# Patient Record
Sex: Male | Born: 1945 | Race: White | Hispanic: No | Marital: Married
Health system: Midwestern US, Community
[De-identification: ages and names within clinical notes are randomized; demographics above are authoritative.]

## PROBLEM LIST (undated history)

## (undated) ENCOUNTER — Emergency Department (HOSPITAL_COMMUNITY): Admission: EM | Payer: Medicare Other | Source: Home / Self Care

## (undated) DIAGNOSIS — N400 Enlarged prostate without lower urinary tract symptoms: Secondary | ICD-10-CM

## (undated) DIAGNOSIS — N529 Male erectile dysfunction, unspecified: Secondary | ICD-10-CM

## (undated) DIAGNOSIS — J449 Chronic obstructive pulmonary disease, unspecified: Secondary | ICD-10-CM

## (undated) DIAGNOSIS — C679 Malignant neoplasm of bladder, unspecified: Secondary | ICD-10-CM

## (undated) DIAGNOSIS — E785 Hyperlipidemia, unspecified: Secondary | ICD-10-CM

## (undated) DIAGNOSIS — C61 Malignant neoplasm of prostate: Secondary | ICD-10-CM

## (undated) DIAGNOSIS — C772 Secondary and unspecified malignant neoplasm of intra-abdominal lymph nodes: Secondary | ICD-10-CM

## (undated) DIAGNOSIS — N3281 Overactive bladder: Secondary | ICD-10-CM

## (undated) DIAGNOSIS — R232 Flushing: Principal | ICD-10-CM

## (undated) DIAGNOSIS — N401 Enlarged prostate with lower urinary tract symptoms: Secondary | ICD-10-CM

## (undated) DIAGNOSIS — R351 Nocturia: Secondary | ICD-10-CM

## (undated) DIAGNOSIS — M818 Other osteoporosis without current pathological fracture: Secondary | ICD-10-CM

## (undated) DIAGNOSIS — R3129 Other microscopic hematuria: Secondary | ICD-10-CM

## (undated) DIAGNOSIS — T387X5A Adverse effect of androgens and anabolic congeners, initial encounter: Secondary | ICD-10-CM

## (undated) HISTORY — DX: Benign prostatic hyperplasia without lower urinary tract symptoms: N40.0

## (undated) HISTORY — DX: Hyperlipidemia, unspecified: E78.5

## (undated) HISTORY — DX: Malignant neoplasm of bladder, unspecified: C67.9

## (undated) HISTORY — DX: Chronic obstructive pulmonary disease, unspecified: J44.9

## (undated) HISTORY — DX: Male erectile dysfunction, unspecified: N52.9

## (undated) HISTORY — PX: UMBILICAL HERNIA REPAIR: SHX196

---

## 2007-08-07 ENCOUNTER — Ambulatory Visit: Payer: Self-pay | Admitting: Internal Medicine

## 2007-08-07 DIAGNOSIS — N529 Male erectile dysfunction, unspecified: Secondary | ICD-10-CM | POA: Insufficient documentation

## 2007-08-08 LAB — CONVERTED CEMR LAB
Cholesterol: 201 mg/dL (ref 0–200)
Total CHOL/HDL Ratio: 4.6
Triglycerides: 138 mg/dL (ref 0–149)

## 2007-11-06 ENCOUNTER — Ambulatory Visit: Payer: Self-pay | Admitting: Family Medicine

## 2007-11-06 DIAGNOSIS — J029 Acute pharyngitis, unspecified: Secondary | ICD-10-CM | POA: Insufficient documentation

## 2007-11-08 ENCOUNTER — Telehealth: Payer: Self-pay | Admitting: Family Medicine

## 2007-11-09 ENCOUNTER — Telehealth (INDEPENDENT_AMBULATORY_CARE_PROVIDER_SITE_OTHER): Payer: Self-pay | Admitting: *Deleted

## 2007-12-15 ENCOUNTER — Ambulatory Visit: Payer: Self-pay | Admitting: Internal Medicine

## 2007-12-15 DIAGNOSIS — J019 Acute sinusitis, unspecified: Secondary | ICD-10-CM | POA: Insufficient documentation

## 2008-02-07 ENCOUNTER — Ambulatory Visit: Payer: Self-pay | Admitting: Internal Medicine

## 2008-02-07 DIAGNOSIS — L723 Sebaceous cyst: Secondary | ICD-10-CM

## 2008-03-06 ENCOUNTER — Telehealth: Payer: Self-pay | Admitting: Internal Medicine

## 2009-11-06 ENCOUNTER — Ambulatory Visit: Payer: Self-pay | Admitting: Internal Medicine

## 2009-11-06 DIAGNOSIS — R002 Palpitations: Secondary | ICD-10-CM

## 2009-11-07 LAB — CONVERTED CEMR LAB
Albumin: 3.8 g/dL (ref 3.5–5.2)
Alkaline Phosphatase: 59 units/L (ref 39–117)
Basophils Relative: 0 % (ref 0.0–3.0)
Calcium: 9.3 mg/dL (ref 8.4–10.5)
Chloride: 107 meq/L (ref 96–112)
Eosinophils Absolute: 0.2 10*3/uL (ref 0.0–0.7)
Hemoglobin: 12.8 g/dL — ABNORMAL LOW (ref 13.0–17.0)
MCHC: 33 g/dL (ref 30.0–36.0)
MCV: 91.5 fL (ref 78.0–100.0)
Monocytes Absolute: 0.4 10*3/uL (ref 0.1–1.0)
Neutro Abs: 5.2 10*3/uL (ref 1.4–7.7)
Potassium: 4.5 meq/L (ref 3.5–5.1)
RBC: 4.25 M/uL (ref 4.22–5.81)
TSH: 0.97 microintl units/mL (ref 0.35–5.50)
Total Protein: 7.4 g/dL (ref 6.0–8.3)

## 2009-11-21 ENCOUNTER — Ambulatory Visit: Payer: Self-pay | Admitting: Internal Medicine

## 2009-11-26 ENCOUNTER — Encounter: Payer: Self-pay | Admitting: Internal Medicine

## 2009-11-26 LAB — CONVERTED CEMR LAB: Fecal Occult Bld: NEGATIVE

## 2010-02-23 ENCOUNTER — Ambulatory Visit: Payer: Self-pay | Admitting: Urology

## 2010-08-12 ENCOUNTER — Ambulatory Visit: Payer: Self-pay | Admitting: Urology

## 2010-08-17 ENCOUNTER — Ambulatory Visit: Payer: Self-pay | Admitting: Urology

## 2010-10-20 NOTE — Assessment & Plan Note (Signed)
Summary: CPX/DLO   Vital Signs:  Patient profile:   65 year old male Weight:      206 pounds BMI:     28.84 Temp:     98.6 degrees F oral Pulse rate:   72 / minute Pulse rhythm:   regular BP sitting:   140 / 80  (left arm) Cuff size:   large  Vitals Entered By: Mervin Hack CMA Duncan Dull) (November 06, 2009 3:29 PM) CC: adult physical   History of Present Illness: Doing okay No new concerns  Wife notes recurrent sinus infections Has been to urgent care twice in past winter season wood heat---humidifier puts out 6 gallons per day    Allergies: 1)  ! * Pencillin  Past History:  Past medical, surgical, family and social histories (including risk factors) reviewed for relevance to current acute and chronic problems.  Past Medical History: Erectile dysfunction  Past Surgical History: Reviewed history from 08/07/2007 and no changes required. 2004 Umbilical hernia  Family History: Reviewed history from 08/07/2007 and no changes required. Dad died of prostate cancer @69  Mom  has kidney problems, HTN Only child No CAD or DM No colon cancer  Social History: Reviewed history from 08/07/2007 and no changes required. 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 Eyes:  Denies double vision and vision loss-1 eye. ENT:  Denies decreased hearing and ringing in ears; Teeth okay--some gum problems regular with dentist and periodontist. CV:  Complains of palpitations and shortness of breath with exertion; denies chest pain or discomfort, difficulty breathing at night, difficulty breathing while lying down, fainting, and lightheadness; occ seems faster when active DOE more noticeable. Resp:   Denies cough and shortness of breath. GI:  Denies abdominal pain, bloody stools, change in bowel habits, dark tarry stools, indigestion, nausea, and vomiting. GU:  Complains of erectile dysfunction; denies urinary frequency and urinary hesitancy; hasn't used the levitra of late--not a part of their marriage. MS:  Denies joint pain and joint swelling. Derm:  Denies lesion(s) and rash. Neuro:  Denies headaches, numbness, tingling, and weakness. Psych:  Denies anxiety and depression. Heme:  Denies abnormal bruising and enlarge lymph nodes. Allergy:  Denies seasonal allergies and sneezing.  Physical Exam  General:  alert and normal appearance.   Eyes:  pupils equal, pupils round, and pupils reactive to light.   Ears:  R ear normal and L ear normal.   Mouth:  no erythema and no lesions.   Neck:  supple, no masses, no thyromegaly, no carotid bruits, and no cervical lymphadenopathy.   Lungs:  normal respiratory effort and normal breath sounds.   Heart:  normal rate, regular rhythm, no murmur, and no gallop.   Abdomen:  soft and non-tender.   Rectal:  no hemorrhoids and no masses.   Prostate:  no gland enlargement and no nodules.   Msk:  no joint tenderness and no joint swelling.   Pulses:  1+ in feet Extremities:  no edema Neurologic:  alert & oriented X3, strength normal in all extremities, and gait normal.   Skin:  no rashes and no suspicious lesions.   Axillary Nodes:  No palpable lymphadenopathy Psych:  normally interactive, good eye contact, not anxious appearing, and not depressed appearing.  Impression & Recommendations:  Problem # 1:  PREVENTIVE HEALTH CARE (ICD-V70.0) Assessment Comment Only discussed fitness PSA due doesn't want colon --will do stool immunoassay  Problem # 2:  PALPITATIONS (ICD-785.1) Assessment: New  doesn't sound pathologic discussed weight loss and exercise   Orders: EKG w/ Interpretation (93000) Venipuncture (86578) TLB-Renal Function Panel  (80069-RENAL) TLB-CBC Platelet - w/Differential (85025-CBCD) TLB-Hepatic/Liver Function Pnl (80076-HEPATIC) TLB-TSH (Thyroid Stimulating Hormone) (84443-TSH)  Complete Medication List: 1)  Adult Aspirin Low Strength 81 Mg Tbdp (Aspirin) .... Take 1 tablet by mouth once a day  Other Orders: Flu Vaccine 1yrs + [phone] Admin 1st Vaccine (95284) Admin 1st Vaccine [facility]) [phone]) Pneumococcal Vaccine (10272) Admin of Any Addtl Vaccine (53664) Admin of Any Addtl Vaccine (State) (40347Q) TLB-PSA (Prostate Specific Antigen) (84153-PSA)  Patient Instructions: 1)  Please schedule a follow-up appointment in 1 year.  2)  Complete your hemoccult cards and return them soon.   Current Allergies (reviewed today): ! * PENCILLIN   Pneumovax Vaccine    Vaccine Type: Pneumovax    Site: right deltoid    Mfr: Merck    Dose: 0.5 ml    Route: IM    Given by: Mervin Hack CMA (AAMA)    Exp. Date: 01/08/2011    Lot #: [id number]    VIS given: 04/17/96 version given November 06, 2009.  Influenza Vaccine    Vaccine Type: Fluvax 3+    Site: left deltoid    Mfr: GlaxoSmithKline    Dose: 0.5 ml    Route: IM    Given by: Mervin Hack CMA (AAMA)    Exp. Date: 03/19/2010    Lot #: [id number]    VIS given: 04/13/07 version given November 06, 2009.  Flu Vaccine Consent Questions    Do you have a history of severe allergic reactions to this vaccine? no    Any prior history of allergic reactions to egg and/or gelatin? no    Do you have a sensitivity to the preservative Thimersol? no    Do you have a past history of Guillan-Barre Syndrome? no    Do you currently have an acute febrile illness? no    Have you ever had a severe reaction to latex? no    Vaccine information given and explained to patient? yes    EKG  Procedure date:  11/06/2009  Findings:      sinus @79  Unremarkable

## 2010-10-20 NOTE — Letter (Signed)
Summary: Results Follow up Letter  [place] at [facility]  [address] [place], [place] [address]   Phone: [phone]  Fax: [phone]    11/26/2009 MRN: [id number]  [facility] [address] [place], [place]  [address]  Dear Mr. Rosko,  The following are the results of your recent test(s):  Test         Result    Pap Smear:        Normal _____  Not Normal _____ Comments: ______________________________________________________ Cholesterol: LDL(Bad cholesterol):         Your goal is less than:         HDL (Good cholesterol):       Your goal is more than: Comments:  ______________________________________________________ Mammogram:        Normal _____  Not Normal _____ Comments:  ___________________________________________________________________ Hemoccult:        Normal __X___  Not normal _______ Comments:  stool test is negative for blood ,we will plan to recheck this again in a year  _____________________________________________________________________ Other Tests:    We routinely do not discuss normal results over the telephone.  If you desire a copy of the results, or you have any questions about this information we can discuss them at your next office visit.   Sincerely,      Tillman Abide, MD

## 2010-10-20 NOTE — Letter (Signed)
Summary: [facility]: Immunoassay Fecal Occult Blood (iFOB) Order Form  [place] at [facility]  [address] [place], [place] [address]   Phone: [phone]  Fax: [phone]      [facility]: Immunoassay Fecal Occult Blood (iFOB) Order Form   November 06, 2009 MRN: [id number]   Cody Barry January 02, 1946   Physicican Name:_______Letvak__________________  Diagnosis Code:________V76.51__________________      [name] MD

## 2010-10-30 ENCOUNTER — Encounter: Payer: Self-pay | Admitting: Internal Medicine

## 2010-10-30 ENCOUNTER — Ambulatory Visit: Payer: Self-pay | Admitting: Internal Medicine

## 2010-10-30 ENCOUNTER — Other Ambulatory Visit: Payer: Self-pay | Admitting: Internal Medicine

## 2010-10-30 ENCOUNTER — Encounter (INDEPENDENT_AMBULATORY_CARE_PROVIDER_SITE_OTHER): Payer: Medicare Other | Admitting: Internal Medicine

## 2010-10-30 DIAGNOSIS — R0609 Other forms of dyspnea: Secondary | ICD-10-CM | POA: Insufficient documentation

## 2010-10-30 DIAGNOSIS — J4489 Other specified chronic obstructive pulmonary disease: Secondary | ICD-10-CM | POA: Insufficient documentation

## 2010-10-30 DIAGNOSIS — L570 Actinic keratosis: Secondary | ICD-10-CM

## 2010-10-30 DIAGNOSIS — R0989 Other specified symptoms and signs involving the circulatory and respiratory systems: Secondary | ICD-10-CM

## 2010-10-30 DIAGNOSIS — J449 Chronic obstructive pulmonary disease, unspecified: Secondary | ICD-10-CM | POA: Insufficient documentation

## 2010-10-30 DIAGNOSIS — N400 Enlarged prostate without lower urinary tract symptoms: Secondary | ICD-10-CM | POA: Insufficient documentation

## 2010-10-30 LAB — CBC WITH DIFFERENTIAL/PLATELET
Basophils Absolute: 0 10*3/uL (ref 0.0–0.1)
Eosinophils Absolute: 0.3 10*3/uL (ref 0.0–0.7)
Eosinophils Relative: 3.8 % (ref 0.0–5.0)
MCV: 89.5 fl (ref 78.0–100.0)
Monocytes Absolute: 0.9 10*3/uL (ref 0.1–1.0)
Neutrophils Relative %: 54.9 % (ref 43.0–77.0)
Platelets: 270 10*3/uL (ref 150.0–400.0)
RDW: 14.3 % (ref 11.5–14.6)
WBC: 6.7 10*3/uL (ref 4.5–10.5)

## 2010-10-30 LAB — HEPATIC FUNCTION PANEL
ALT: 17 U/L (ref 0–53)
Bilirubin, Direct: 0.1 mg/dL (ref 0.0–0.3)
Total Bilirubin: 0.7 mg/dL (ref 0.3–1.2)

## 2010-10-30 LAB — RENAL FUNCTION PANEL
Calcium: 9.2 mg/dL (ref 8.4–10.5)
Creatinine, Ser: 1.4 mg/dL (ref 0.4–1.5)
Glucose, Bld: 79 mg/dL (ref 70–99)
Sodium: 137 mEq/L (ref 135–145)

## 2010-11-05 NOTE — Assessment & Plan Note (Signed)
Summary: cpe JRT   Vital Signs:  Patient profile:   65 year old male Weight:      198 pounds BMI:     27.72 Temp:     98.3 degrees F oral Pulse rate:   79 / minute Pulse rhythm:   regular BP sitting:   113 / 58  (left arm) Cuff size:   large  Vitals Entered By: Mervin Hack CMA Duncan Dull) (October 30, 2010 8:06 AM) CC: adult physical   History of Present Illness: Doing fairly well  Does note some DOE--when running or really pushing fast Just once in a while especially when he is trying to just get started No chest pain  No palpitations Still farms so fairly active ---just had to slow down some  No sig cough no wheezing Did have sneezing this  AM--not a persistent thing  Bladder cancer diagnosed in May Had hematuria Seen by Dr Artis Flock--- 2 surgeries Has had intravesicular chemo also  Allergies: 1)  ! * Pencillin  Past History:  Past medical, surgical, family and social histories (including risk factors) reviewed for relevance to current acute and chronic problems.  Past Medical History: Erectile dysfunction Benign prostatic hypertrophy Bladder cancer  Past Surgical History: 2004 Umbilical hernia 6/11 TURBT  Family History: Reviewed history from 08/07/2007 and no changes required. Dad died of prostate cancer @69  Mom  has kidney problems, HTN Only child No CAD or DM No colon cancer  Social History: Reviewed history from 08/07/2007 and no changes required. 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 Eyes:  Complains of blurring; denies double vision and vision loss-1 eye; early cataracts. ENT:  Complains of decreased hearing; denies ringing in ears; Teeth okay--has had some gum problems. Keeps up with dentist. CV:  Complains of shortness of breath with  exertion; denies chest pain or discomfort, difficulty breathing at night, difficulty breathing while lying down, fainting, lightheadness, palpitations, and swelling of feet. Resp:  Complains of cough; denies shortness of breath. GI:  Complains of constipation and indigestion; denies abdominal pain, bloody stools, dark tarry stools, nausea, and vomiting; one episode of acid reflux Stool softener helps bowels. GU:  Complains of nocturia and urinary hesitancy; denies erectile dysfunction; has some urgency. MS:  Denies joint pain and joint swelling. Derm:  Complains of lesion(s); denies rash; spot on left wrist--?from watch raised area on left cheek. Neuro:  Denies headaches, numbness, tingling, and weakness. Psych:  Denies anxiety and depression. Heme:  Denies abnormal bruising and enlarge lymph nodes. Allergy:  Denies seasonal allergies and sneezing.  Physical Exam  General:  alert and normal appearance.   Eyes:  pupils equal, pupils round, and pupils reactive to light.  Limited fundus exam but looks okay Ears:  R ear normal and L ear normal.   Mouth:  no erythema, no exudates, and no lesions.   Neck:  supple, no masses, no thyromegaly, no carotid bruits, and no cervical lymphadenopathy.   Lungs:  normal respiratory effort, no intercostal retractions, no accessory muscle use, normal breath sounds, no crackles, and no wheezes.   Heart:  normal rate, regular rhythm, no murmur, and no gallop.   Abdomen:  soft, non-tender, and no masses.   Msk:  no joint tenderness and no joint swelling.   Pulses:  1+ in feet Extremities:  no edema Neurologic:  alert & oriented  X3, strength normal in all extremities, and gait normal.   Skin:  non specific hyperpigmented area on left wrist Raised multilobar lesion on left cheek Axillary Nodes:  No palpable lymphadenopathy Psych:  normally interactive, good eye contact, not anxious appearing, and not depressed appearing.     Impression &  Recommendations:  Problem # 1:  PREVENTIVE HEALTH CARE (ICD-V70.0) Assessment Comment Only seems healthy overall New bladder cancer---must be transitional cell---- Dr Artis Flock managing discussed PSA--advised may not be a good idea. He will review with Dr Artis Flock agrees to do immunoassay again  Problem # 2:  DYSPNEA/SHORTNESS OF BREATH (ICD-786.09) Assessment: New  noticing intermittent symptoms Spirometry shows sig airflow obstruction----along with cold weather, this is likely to explain things Symptoms are milder than spirometry findings---meds don't appear indicated Fortunately, not smoking  EKG shows low voltage consistent with lung disease nothing to suggest ischemia  observation only  Orders: EKG w/ Interpretation (93000) Spirometry w/Graph (94010) Venipuncture (65784) TLB-Renal Function Panel (80069-RENAL) TLB-CBC Platelet - w/Differential (85025-CBCD) TLB-Hepatic/Liver Function Pnl (80076-HEPATIC) TLB-TSH (Thyroid Stimulating Hormone) (84443-TSH)  Problem # 3:  ACTINIC KERATOSIS (ICD-702.0) Assessment: New  lesion on cheek treated 45 seconds x 2 discussed home Rx consider biopsy if recurs  Orders: Cryotherapy/Destruction benign or premalignant lesion (1st lesion)  (17000)  Complete Medication List: 1)  Adult Aspirin Low Strength 81 Mg Tbdp (Aspirin) .... Take 1 tablet by mouth once a day 2)  Finasteride 5 Mg Tabs (Finasteride) .... Take 1 by mouth once daily 3)  Tamsulosin Hcl 0.4 Mg Caps (Tamsulosin hcl) .... Take 1 by mouth once daily  Patient Instructions: 1)  Please call for reevaluation if your breathing worsens 2)  Please schedule a follow-up appointment in 1 year.  3)  Complete your hemoccult cards and return them soon.    Orders Added: 1)  Est. Patient 65& > [99397] 2)  EKG w/ Interpretation [93000] 3)  Spirometry w/Graph [94010] 4)  Cryotherapy/Destruction benign or premalignant lesion (1st lesion)  [17000] 5)  Venipuncture [36415] 6)  TLB-Renal  Function Panel [80069-RENAL] 7)  TLB-CBC Platelet - w/Differential [85025-CBCD] 8)  TLB-Hepatic/Liver Function Pnl [80076-HEPATIC] 9)  TLB-TSH (Thyroid Stimulating Hormone) [84443-TSH] 10)  Est. Patient Level IV [[address]]    Current Allergies (reviewed today): ! * PENCILLIN  Appended Document: cpe JRT    Clinical Lists Changes  Problems: Added new problem of COPD (ICD-496) Observations: Added new observation of PAST MED HX: Erectile dysfunction Benign prostatic hypertrophy Bladder cancer COPD  (10/30/2010 9:21) Added new observation of HX OF COPD: yes (10/30/2010 9:21)       Past History:  Past Medical History: Erectile dysfunction Benign prostatic hypertrophy Bladder cancer COPD

## 2010-11-05 NOTE — Letter (Signed)
Summary: [facility]: Immunoassay Fecal Occult Blood (iFOB) Order Form  [name] at [facility]  [address] [place], [place] [address]   Phone: [phone]  Fax: [phone]      [place]: Immunoassay Fecal Occult Blood (iFOB) Order Form   October 30, 2010 MRN: [id number]   Cody Barry [birth year]   Physicican Name:______Letvak___________________  Diagnosis Code:________V76.51__________________      [name] MD

## 2010-11-24 ENCOUNTER — Other Ambulatory Visit: Payer: Self-pay | Admitting: Internal Medicine

## 2010-11-24 ENCOUNTER — Encounter (INDEPENDENT_AMBULATORY_CARE_PROVIDER_SITE_OTHER): Payer: Self-pay | Admitting: *Deleted

## 2010-11-24 ENCOUNTER — Other Ambulatory Visit: Payer: Medicare Other

## 2010-11-24 DIAGNOSIS — Z1211 Encounter for screening for malignant neoplasm of colon: Secondary | ICD-10-CM

## 2010-11-24 LAB — FECAL OCCULT BLOOD, IMMUNOCHEMICAL: Fecal Occult Bld: NEGATIVE

## 2010-11-25 ENCOUNTER — Encounter: Payer: Self-pay | Admitting: Internal Medicine

## 2010-12-01 NOTE — Letter (Signed)
Summary: Results Follow up Letter  [place] at [facility]  [address] [place], [place] [address]   Phone: [phone]  Fax: [phone]    11/25/2010 MRN: [id number]  [facility] [address] [place], [place]  [address]  [place]  Dear Cody Barry,  The following are the results of your recent test(s):  Test         Result    Pap Smear:        Normal _____  Not Normal _____ Comments: ______________________________________________________ Cholesterol: LDL(Bad cholesterol):         Your goal is less than:         HDL (Good cholesterol):       Your goal is more than: Comments:  ______________________________________________________ Mammogram:        Normal _____  Not Normal _____ Comments:  ___________________________________________________________________ Hemoccult:        Normal __X___  Not normal _______ Comments: stool test does not show any blood. We will plan to repeat this next year  _____________________________________________________________________ Other Tests:    We routinely do not discuss normal results over the telephone.  If you desire a copy of the results, or you have any questions about this information we can discuss them at your next office visit.   Sincerely,      Tillman Abide, MD

## 2011-02-25 ENCOUNTER — Ambulatory Visit: Payer: Self-pay | Admitting: Urology

## 2011-03-01 ENCOUNTER — Ambulatory Visit: Payer: Self-pay | Admitting: Urology

## 2011-06-02 ENCOUNTER — Ambulatory Visit: Payer: Self-pay | Admitting: Urology

## 2011-06-07 ENCOUNTER — Ambulatory Visit: Payer: Self-pay | Admitting: Urology

## 2011-10-07 ENCOUNTER — Ambulatory Visit: Payer: Self-pay | Admitting: Urology

## 2011-10-07 LAB — HEMOGLOBIN: HGB: 11.8 g/dL — ABNORMAL LOW (ref 13.0–18.0)

## 2011-10-11 ENCOUNTER — Ambulatory Visit: Payer: Self-pay | Admitting: Urology

## 2011-10-13 LAB — PATHOLOGY REPORT

## 2011-11-01 ENCOUNTER — Encounter: Payer: Self-pay | Admitting: Internal Medicine

## 2011-11-02 ENCOUNTER — Encounter: Payer: Self-pay | Admitting: Internal Medicine

## 2011-11-02 ENCOUNTER — Ambulatory Visit (INDEPENDENT_AMBULATORY_CARE_PROVIDER_SITE_OTHER): Payer: Medicare Other | Admitting: Internal Medicine

## 2011-11-02 ENCOUNTER — Ambulatory Visit: Payer: Self-pay | Admitting: Oncology

## 2011-11-02 DIAGNOSIS — C679 Malignant neoplasm of bladder, unspecified: Secondary | ICD-10-CM

## 2011-11-02 DIAGNOSIS — N400 Enlarged prostate without lower urinary tract symptoms: Secondary | ICD-10-CM

## 2011-11-02 DIAGNOSIS — Z1211 Encounter for screening for malignant neoplasm of colon: Secondary | ICD-10-CM

## 2011-11-02 DIAGNOSIS — C7951 Secondary malignant neoplasm of bone: Secondary | ICD-10-CM | POA: Insufficient documentation

## 2011-11-02 DIAGNOSIS — J449 Chronic obstructive pulmonary disease, unspecified: Secondary | ICD-10-CM

## 2011-11-02 NOTE — Assessment & Plan Note (Signed)
Improved with prostate procedure No meds now

## 2011-11-02 NOTE — Assessment & Plan Note (Signed)
Unable to contain with TURBT Going for oncology eval If no mets, probably should have bladder removed Counseled on this

## 2011-11-02 NOTE — Assessment & Plan Note (Signed)
Has done fine with breathing No smoking in 3-4 years now

## 2011-11-02 NOTE — Progress Notes (Signed)
  Subjective:    Patient ID: Cody Barry, male    DOB: 1946-03-14, 66 y.o.   MRN: [id number]  HPI Went back to [facility] last week  Had recent cystoscopy and was unable to take all the cancer out Has had 5 procedures over the last few years Partial prostatectomy has relieved BPH symptoms  Has appt at cancer center today Going to [facility] to speak to surgeon soon also Has not had any testing to assure no mets  Does note some post nasal drip Gets stuck at times No history of allergy problems Wood furnace with circulating hot water Discussed humidifier  Breathing is better No sig cough No edema  No current outpatient prescriptions on file prior to visit.    Allergies  Allergen Reactions  . Penicillins     Past Medical History  Diagnosis Date  . ED (erectile dysfunction)   . BPH (benign prostatic hypertrophy)   . Bladder cancer   . COPD (chronic obstructive pulmonary disease)     Past Surgical History  Procedure Date  . Umbilical hernia repair     Family History  Problem Relation Age of Onset  . Hypertension Mother   . Coronary artery disease Neg Hx   . Diabetes Neg Hx   . Cancer Neg Hx     History   Social History  . Marital Status: Married    Spouse Name: N/A    Number of Children: 3  . Years of Education: N/A   Occupational History  . farmer- grain    Social History Main Topics  . Smoking status: Former Smoker    Types: Cigarettes    Quit date: 09/20/2006  . Smokeless tobacco: Never Used  . Alcohol Use: Yes     rare  . Drug Use: No  . Sexually Active: Not on file   Other Topics Concern  . Not on file   Social History Narrative  . No narrative on file   Review of Systems Sleeps till 3AM--occ awake for a while Appetite is okay Weight is down 10# since last year     Objective:   Physical Exam  Constitutional: He appears well-developed and well-nourished. No distress.  Neck: Normal range of motion. Neck supple. No thyromegaly present.    Cardiovascular: Normal rate, regular rhythm and normal heart sounds.  Exam reveals no gallop.   No murmur heard. Pulmonary/Chest: Effort normal and breath sounds normal. No respiratory distress. He has no wheezes. He has no rales.  Abdominal: Soft. There is no tenderness.  Musculoskeletal: He exhibits no edema and no tenderness.  Lymphadenopathy:    He has no cervical adenopathy.  Psychiatric: He has a normal mood and affect. His behavior is normal. Judgment and thought content normal.          Assessment & Plan:

## 2011-11-05 ENCOUNTER — Ambulatory Visit: Payer: Self-pay | Admitting: Oncology

## 2011-11-16 ENCOUNTER — Ambulatory Visit: Payer: Self-pay | Admitting: General Surgery

## 2011-11-19 ENCOUNTER — Ambulatory Visit: Payer: Self-pay | Admitting: General Surgery

## 2011-11-19 ENCOUNTER — Ambulatory Visit: Payer: Self-pay | Admitting: Oncology

## 2011-11-22 ENCOUNTER — Other Ambulatory Visit: Payer: Medicare Other

## 2011-11-22 LAB — CBC CANCER CENTER
Basophil #: 0 x10 3/mm (ref 0.0–0.1)
Eosinophil #: 0.3 x10 3/mm (ref 0.0–0.7)
HCT: 33.1 % — ABNORMAL LOW (ref 40.0–52.0)
HGB: 11.2 g/dL — ABNORMAL LOW (ref 13.0–18.0)
Lymphocyte #: 1.7 x10 3/mm (ref 1.0–3.6)
Lymphocyte %: 23.8 %
MCHC: 33.9 g/dL (ref 32.0–36.0)
Monocyte %: 15.1 %
Neutrophil #: 3.9 x10 3/mm (ref 1.4–6.5)
Neutrophil %: 56.4 %
Platelet: 267 x10 3/mm (ref 150–440)
RDW: 15.4 % — ABNORMAL HIGH (ref 11.5–14.5)
WBC: 7 x10 3/mm (ref 3.8–10.6)

## 2011-11-22 LAB — COMPREHENSIVE METABOLIC PANEL
Albumin: 3.3 g/dL — ABNORMAL LOW (ref 3.4–5.0)
Anion Gap: 10 (ref 7–16)
BUN: 20 mg/dL — ABNORMAL HIGH (ref 7–18)
Bilirubin,Total: 0.4 mg/dL (ref 0.2–1.0)
Chloride: 107 mmol/L (ref 98–107)
Creatinine: 1.66 mg/dL — ABNORMAL HIGH (ref 0.60–1.30)
EGFR (African American): 53 — ABNORMAL LOW
Glucose: 70 mg/dL (ref 65–99)
Potassium: 4.1 mmol/L (ref 3.5–5.1)
Sodium: 140 mmol/L (ref 136–145)
Total Protein: 7.3 g/dL (ref 6.4–8.2)

## 2011-11-23 ENCOUNTER — Encounter: Payer: Self-pay | Admitting: *Deleted

## 2011-11-23 LAB — FECAL OCCULT BLOOD, IMMUNOCHEMICAL: Fecal Occult Bld: NEGATIVE

## 2011-11-29 LAB — COMPREHENSIVE METABOLIC PANEL
Albumin: 3.4 g/dL (ref 3.4–5.0)
Alkaline Phosphatase: 82 U/L (ref 50–136)
Anion Gap: 11 (ref 7–16)
BUN: 37 mg/dL — ABNORMAL HIGH (ref 7–18)
Bilirubin,Total: 0.3 mg/dL (ref 0.2–1.0)
Chloride: 105 mmol/L (ref 98–107)
Creatinine: 2.03 mg/dL — ABNORMAL HIGH (ref 0.60–1.30)
Glucose: 84 mg/dL (ref 65–99)
Osmolality: 285 (ref 275–301)
Potassium: 4.5 mmol/L (ref 3.5–5.1)
SGPT (ALT): 60 U/L
Sodium: 139 mmol/L (ref 136–145)
Total Protein: 7.8 g/dL (ref 6.4–8.2)

## 2011-11-29 LAB — CBC CANCER CENTER
Basophil %: 0.4 %
Eosinophil #: 0.2 x10 3/mm (ref 0.0–0.7)
Eosinophil %: 5.6 %
HGB: 11.2 g/dL — ABNORMAL LOW (ref 13.0–18.0)
Lymphocyte #: 1.1 x10 3/mm (ref 1.0–3.6)
MCH: 28.5 pg (ref 26.0–34.0)
MCHC: 34.3 g/dL (ref 32.0–36.0)
MCV: 83 fL (ref 80–100)
Platelet: 173 x10 3/mm (ref 150–440)
RBC: 3.93 10*6/uL — ABNORMAL LOW (ref 4.40–5.90)

## 2011-12-06 LAB — CBC CANCER CENTER
Basophil %: 0.2 %
Eosinophil #: 0 x10 3/mm (ref 0.0–0.7)
Eosinophil %: 1.2 %
HCT: 29.1 % — ABNORMAL LOW (ref 40.0–52.0)
HGB: 9.8 g/dL — ABNORMAL LOW (ref 13.0–18.0)
Lymphocyte %: 41.1 %
MCHC: 33.8 g/dL (ref 32.0–36.0)
MCV: 84 fL (ref 80–100)
Monocyte %: 14.6 %
Neutrophil #: 1.4 x10 3/mm (ref 1.4–6.5)
Neutrophil %: 42.9 %
RDW: 15.4 % — ABNORMAL HIGH (ref 11.5–14.5)
WBC: 3.2 x10 3/mm — ABNORMAL LOW (ref 3.8–10.6)

## 2011-12-06 LAB — COMPREHENSIVE METABOLIC PANEL
Anion Gap: 9 (ref 7–16)
BUN: 27 mg/dL — ABNORMAL HIGH (ref 7–18)
Bilirubin,Total: 0.2 mg/dL (ref 0.2–1.0)
Calcium, Total: 8.3 mg/dL — ABNORMAL LOW (ref 8.5–10.1)
Chloride: 107 mmol/L (ref 98–107)
Co2: 24 mmol/L (ref 21–32)
Creatinine: 1.64 mg/dL — ABNORMAL HIGH (ref 0.60–1.30)
EGFR (African American): 54 — ABNORMAL LOW
EGFR (Non-African Amer.): 45 — ABNORMAL LOW
Osmolality: 284 (ref 275–301)
Potassium: 4.6 mmol/L (ref 3.5–5.1)
SGPT (ALT): 57 U/L

## 2011-12-13 LAB — CBC CANCER CENTER
Bands: 3 %
Basophil #: 0 x10 3/mm (ref 0.0–0.1)
Basophil %: 0.1 %
Basophil: 2 %
Eosinophil #: 0.3 x10 3/mm (ref 0.0–0.7)
Eosinophil %: 6.2 %
HCT: 28.5 % — ABNORMAL LOW (ref 40.0–52.0)
Lymphocyte #: 1.5 x10 3/mm (ref 1.0–3.6)
Lymphocyte %: 27.4 %
MCH: 28.9 pg (ref 26.0–34.0)
MCHC: 33.9 g/dL (ref 32.0–36.0)
Monocyte #: 1.7 x10 3/mm — ABNORMAL HIGH (ref 0.0–0.7)
Monocyte %: 30.4 %
Monocytes: 26 %
Neutrophil #: 2 x10 3/mm (ref 1.4–6.5)
RDW: 15.9 % — ABNORMAL HIGH (ref 11.5–14.5)
WBC: 5.5 x10 3/mm (ref 3.8–10.6)

## 2011-12-13 LAB — COMPREHENSIVE METABOLIC PANEL
Albumin: 3 g/dL — ABNORMAL LOW (ref 3.4–5.0)
Alkaline Phosphatase: 78 U/L (ref 50–136)
BUN: 22 mg/dL — ABNORMAL HIGH (ref 7–18)
Bilirubin,Total: 0.3 mg/dL (ref 0.2–1.0)
Calcium, Total: 8.6 mg/dL (ref 8.5–10.1)
Co2: 24 mmol/L (ref 21–32)
EGFR (African American): 52 — ABNORMAL LOW
Glucose: 75 mg/dL (ref 65–99)
SGOT(AST): 21 U/L (ref 15–37)
SGPT (ALT): 35 U/L
Total Protein: 7.2 g/dL (ref 6.4–8.2)

## 2011-12-20 ENCOUNTER — Ambulatory Visit: Payer: Self-pay | Admitting: Oncology

## 2011-12-20 LAB — CBC CANCER CENTER
Basophil #: 0 x10 3/mm (ref 0.0–0.1)
Basophil %: 0.1 %
Eosinophil #: 0.2 x10 3/mm (ref 0.0–0.7)
Eosinophil %: 3.5 %
HCT: 28.8 % — ABNORMAL LOW (ref 40.0–52.0)
Lymphocyte %: 39 %
MCHC: 33.9 g/dL (ref 32.0–36.0)
MCV: 85 fL (ref 80–100)
Monocyte #: 0.6 x10 3/mm (ref 0.0–0.7)
RBC: 3.38 10*6/uL — ABNORMAL LOW (ref 4.40–5.90)
RDW: 16.1 % — ABNORMAL HIGH (ref 11.5–14.5)
WBC: 4.7 x10 3/mm (ref 3.8–10.6)

## 2011-12-27 LAB — BASIC METABOLIC PANEL
Anion Gap: 9 (ref 7–16)
BUN: 29 mg/dL — ABNORMAL HIGH (ref 7–18)
Chloride: 105 mmol/L (ref 98–107)
Co2: 24 mmol/L (ref 21–32)
Creatinine: 1.72 mg/dL — ABNORMAL HIGH (ref 0.60–1.30)
Osmolality: 282 (ref 275–301)
Sodium: 138 mmol/L (ref 136–145)

## 2011-12-27 LAB — CBC CANCER CENTER
Bands: 9 %
Basophil %: 0.5 %
Eosinophil %: 2.2 %
Eosinophil: 1 %
HGB: 9.4 g/dL — ABNORMAL LOW (ref 13.0–18.0)
Lymphocyte #: 1.3 x10 3/mm (ref 1.0–3.6)
Lymphocyte %: 26.7 %
MCV: 87 fL (ref 80–100)
Metamyelocyte: 1 %
Monocytes: 25 %
Platelet: 243 x10 3/mm (ref 150–440)
RBC: 3.23 10*6/uL — ABNORMAL LOW (ref 4.40–5.90)
RDW: 17.3 % — ABNORMAL HIGH (ref 11.5–14.5)

## 2011-12-27 LAB — HEPATIC FUNCTION PANEL A (ARMC)
Albumin: 3.3 g/dL — ABNORMAL LOW (ref 3.4–5.0)
Alkaline Phosphatase: 74 U/L (ref 50–136)
SGPT (ALT): 21 U/L
Total Protein: 7.2 g/dL (ref 6.4–8.2)

## 2012-01-03 LAB — CBC CANCER CENTER
Basophil #: 0.1 x10 3/mm (ref 0.0–0.1)
Eosinophil #: 0.2 x10 3/mm (ref 0.0–0.7)
Eosinophil %: 2.2 %
MCH: 28.2 pg (ref 26.0–34.0)
MCHC: 32 g/dL (ref 32.0–36.0)
MCV: 88 fL (ref 80–100)
Monocyte %: 17.7 %
Neutrophil #: 4 x10 3/mm (ref 1.4–6.5)
Neutrophil %: 56.4 %
Platelet: 182 x10 3/mm (ref 150–440)
RBC: 3.24 10*6/uL — ABNORMAL LOW (ref 4.40–5.90)
RDW: 17.5 % — ABNORMAL HIGH (ref 11.5–14.5)

## 2012-01-03 LAB — BASIC METABOLIC PANEL
Calcium, Total: 8.9 mg/dL (ref 8.5–10.1)
Co2: 23 mmol/L (ref 21–32)
EGFR (African American): 48 — ABNORMAL LOW
EGFR (Non-African Amer.): 41 — ABNORMAL LOW
Glucose: 102 mg/dL — ABNORMAL HIGH (ref 65–99)
Osmolality: 280 (ref 275–301)
Potassium: 4.5 mmol/L (ref 3.5–5.1)
Sodium: 138 mmol/L (ref 136–145)

## 2012-01-10 LAB — CBC CANCER CENTER
Basophil %: 0.5 %
Eosinophil #: 0.2 x10 3/mm (ref 0.0–0.7)
Eosinophil %: 5.9 %
HGB: 9.8 g/dL — ABNORMAL LOW (ref 13.0–18.0)
Lymphocyte %: 38.6 %
MCH: 28.2 pg (ref 26.0–34.0)
MCHC: 32.3 g/dL (ref 32.0–36.0)
MCV: 87 fL (ref 80–100)
Monocyte #: 0.2 x10 3/mm (ref 0.2–1.0)
Monocyte %: 6.2 %
Neutrophil %: 48.8 %
Platelet: 196 x10 3/mm (ref 150–440)
RBC: 3.46 10*6/uL — ABNORMAL LOW (ref 4.40–5.90)
RDW: 17.4 % — ABNORMAL HIGH (ref 11.5–14.5)
WBC: 4 x10 3/mm (ref 3.8–10.6)

## 2012-01-10 LAB — COMPREHENSIVE METABOLIC PANEL
Alkaline Phosphatase: 80 U/L (ref 50–136)
Anion Gap: 9 (ref 7–16)
BUN: 29 mg/dL — ABNORMAL HIGH (ref 7–18)
Bilirubin,Total: 0.3 mg/dL (ref 0.2–1.0)
Calcium, Total: 9.1 mg/dL (ref 8.5–10.1)
Chloride: 104 mmol/L (ref 98–107)
Creatinine: 1.46 mg/dL — ABNORMAL HIGH (ref 0.60–1.30)
EGFR (African American): 56 — ABNORMAL LOW
Glucose: 76 mg/dL (ref 65–99)
Osmolality: 280 (ref 275–301)
Potassium: 4.6 mmol/L (ref 3.5–5.1)
SGOT(AST): 22 U/L (ref 15–37)
SGPT (ALT): 24 U/L
Sodium: 138 mmol/L (ref 136–145)

## 2012-01-17 LAB — COMPREHENSIVE METABOLIC PANEL
Alkaline Phosphatase: 67 U/L (ref 50–136)
BUN: 28 mg/dL — ABNORMAL HIGH (ref 7–18)
Bilirubin,Total: 0.2 mg/dL (ref 0.2–1.0)
Calcium, Total: 8.4 mg/dL — ABNORMAL LOW (ref 8.5–10.1)
Chloride: 111 mmol/L — ABNORMAL HIGH (ref 98–107)
Co2: 23 mmol/L (ref 21–32)
Creatinine: 1.51 mg/dL — ABNORMAL HIGH (ref 0.60–1.30)
EGFR (African American): 54 — ABNORMAL LOW
EGFR (Non-African Amer.): 47 — ABNORMAL LOW
Glucose: 100 mg/dL — ABNORMAL HIGH (ref 65–99)
Potassium: 4.3 mmol/L (ref 3.5–5.1)
SGOT(AST): 21 U/L (ref 15–37)
SGPT (ALT): 19 U/L
Sodium: 142 mmol/L (ref 136–145)
Total Protein: 7.3 g/dL (ref 6.4–8.2)

## 2012-01-17 LAB — CBC CANCER CENTER
Eosinophil #: 0.1 x10 3/mm (ref 0.0–0.7)
Eosinophil %: 3.6 %
HCT: 28.4 % — ABNORMAL LOW (ref 40.0–52.0)
MCH: 28.6 pg (ref 26.0–34.0)
MCHC: 32 g/dL (ref 32.0–36.0)
Monocyte #: 0.6 x10 3/mm (ref 0.2–1.0)
Monocyte %: 19.6 %
Neutrophil #: 0.9 x10 3/mm — ABNORMAL LOW (ref 1.4–6.5)
Platelet: 274 x10 3/mm (ref 150–440)
RDW: 18.6 % — ABNORMAL HIGH (ref 11.5–14.5)
WBC: 3 x10 3/mm — ABNORMAL LOW (ref 3.8–10.6)

## 2012-01-19 ENCOUNTER — Ambulatory Visit: Payer: Self-pay | Admitting: Oncology

## 2012-01-21 LAB — CBC CANCER CENTER
Basophil #: 0 x10 3/mm (ref 0.0–0.1)
Basophil %: 0.3 %
HGB: 9.4 g/dL — ABNORMAL LOW (ref 13.0–18.0)
Lymphocyte %: 13.4 %
MCV: 90 fL (ref 80–100)
Monocyte #: 1.2 x10 3/mm — ABNORMAL HIGH (ref 0.2–1.0)
Monocyte %: 11.2 %
Neutrophil #: 7.7 x10 3/mm — ABNORMAL HIGH (ref 1.4–6.5)
Neutrophil %: 74.3 %
Platelet: 212 x10 3/mm (ref 150–440)
RDW: 21.4 % — ABNORMAL HIGH (ref 11.5–14.5)

## 2012-01-21 LAB — URINALYSIS, COMPLETE
Bilirubin,UR: NEGATIVE
Leukocyte Esterase: NEGATIVE
Nitrite: NEGATIVE
Ph: 5 (ref 4.5–8.0)
Protein: 30
Specific Gravity: 1.017 (ref 1.003–1.030)
Squamous Epithelial: 1
WBC UR: 8 /HPF (ref 0–5)

## 2012-01-23 LAB — URINE CULTURE

## 2012-01-24 LAB — CBC CANCER CENTER
Bands: 29 %
HGB: 9 g/dL — ABNORMAL LOW (ref 13.0–18.0)
Lymphocytes: 9 %
MCH: 28.2 pg (ref 26.0–34.0)
MCHC: 31.2 g/dL — ABNORMAL LOW (ref 32.0–36.0)
Metamyelocyte: 1 %
Monocytes: 14 %
Myelocyte: 2 %
Promyelocyte: 1 %
RBC: 3.19 10*6/uL — ABNORMAL LOW (ref 4.40–5.90)

## 2012-01-27 LAB — CULTURE, BLOOD (SINGLE)

## 2012-01-31 LAB — CBC CANCER CENTER
Eosinophil #: 0.1 x10 3/mm (ref 0.0–0.7)
Eosinophil %: 0.5 %
HCT: 27.1 % — ABNORMAL LOW (ref 40.0–52.0)
Lymphocyte #: 1.7 x10 3/mm (ref 1.0–3.6)
Lymphocyte %: 14.8 %
MCHC: 31.6 g/dL — ABNORMAL LOW (ref 32.0–36.0)
MCV: 92 fL (ref 80–100)
Monocyte %: 13.3 %
Neutrophil #: 8.2 x10 3/mm — ABNORMAL HIGH (ref 1.4–6.5)
Neutrophil %: 71 %
RBC: 2.96 10*6/uL — ABNORMAL LOW (ref 4.40–5.90)
WBC: 11.6 x10 3/mm — ABNORMAL HIGH (ref 3.8–10.6)

## 2012-01-31 LAB — COMPREHENSIVE METABOLIC PANEL
Alkaline Phosphatase: 85 U/L (ref 50–136)
BUN: 26 mg/dL — ABNORMAL HIGH (ref 7–18)
Chloride: 103 mmol/L (ref 98–107)
Co2: 24 mmol/L (ref 21–32)
Creatinine: 1.78 mg/dL — ABNORMAL HIGH (ref 0.60–1.30)
EGFR (African American): 44 — ABNORMAL LOW
EGFR (Non-African Amer.): 38 — ABNORMAL LOW
Glucose: 101 mg/dL — ABNORMAL HIGH (ref 65–99)
Osmolality: 279 (ref 275–301)
Potassium: 4 mmol/L (ref 3.5–5.1)
SGOT(AST): 20 U/L (ref 15–37)
Sodium: 137 mmol/L (ref 136–145)
Total Protein: 7.3 g/dL (ref 6.4–8.2)

## 2012-02-07 LAB — CBC CANCER CENTER
Basophil #: 0 x10 3/mm (ref 0.0–0.1)
Basophil %: 0.6 %
Eosinophil #: 0.1 x10 3/mm (ref 0.0–0.7)
Lymphocyte #: 1.4 x10 3/mm (ref 1.0–3.6)
MCH: 29.6 pg (ref 26.0–34.0)
MCHC: 32.2 g/dL (ref 32.0–36.0)
MCV: 92 fL (ref 80–100)
Monocyte %: 10.1 %
Neutrophil %: 46.7 %
Platelet: 231 x10 3/mm (ref 150–440)
RBC: 2.71 10*6/uL — ABNORMAL LOW (ref 4.40–5.90)
WBC: 3.5 x10 3/mm — ABNORMAL LOW (ref 3.8–10.6)

## 2012-02-07 LAB — COMPREHENSIVE METABOLIC PANEL
Alkaline Phosphatase: 76 U/L (ref 50–136)
BUN: 32 mg/dL — ABNORMAL HIGH (ref 7–18)
Bilirubin,Total: 0.3 mg/dL (ref 0.2–1.0)
Chloride: 107 mmol/L (ref 98–107)
Creatinine: 1.39 mg/dL — ABNORMAL HIGH (ref 0.60–1.30)
EGFR (African American): 60 — ABNORMAL LOW
Glucose: 97 mg/dL (ref 65–99)
Osmolality: 290 (ref 275–301)
Potassium: 4.4 mmol/L (ref 3.5–5.1)
SGOT(AST): 23 U/L (ref 15–37)
Sodium: 142 mmol/L (ref 136–145)

## 2012-02-15 LAB — COMPREHENSIVE METABOLIC PANEL
Alkaline Phosphatase: 71 U/L (ref 50–136)
BUN: 27 mg/dL — ABNORMAL HIGH (ref 7–18)
Bilirubin,Total: 0.3 mg/dL (ref 0.2–1.0)
Calcium, Total: 8.6 mg/dL (ref 8.5–10.1)
Chloride: 105 mmol/L (ref 98–107)
Co2: 25 mmol/L (ref 21–32)
Creatinine: 1.6 mg/dL — ABNORMAL HIGH (ref 0.60–1.30)
EGFR (African American): 51 — ABNORMAL LOW
Osmolality: 284 (ref 275–301)
SGOT(AST): 17 U/L (ref 15–37)
SGPT (ALT): 22 U/L
Sodium: 140 mmol/L (ref 136–145)
Total Protein: 6.9 g/dL (ref 6.4–8.2)

## 2012-02-15 LAB — CBC CANCER CENTER
Basophil #: 0 x10 3/mm (ref 0.0–0.1)
Eosinophil %: 1.7 %
HCT: 25.3 % — ABNORMAL LOW (ref 40.0–52.0)
HGB: 8.2 g/dL — ABNORMAL LOW (ref 13.0–18.0)
Lymphocyte #: 1.5 x10 3/mm (ref 1.0–3.6)
Lymphocyte %: 38 %
MCH: 30.3 pg (ref 26.0–34.0)
MCV: 94 fL (ref 80–100)
Monocyte #: 0.8 x10 3/mm (ref 0.2–1.0)
Monocyte %: 21.5 %
Neutrophil #: 1.5 x10 3/mm (ref 1.4–6.5)
Platelet: 182 x10 3/mm (ref 150–440)
RBC: 2.69 10*6/uL — ABNORMAL LOW (ref 4.40–5.90)
RDW: 23.9 % — ABNORMAL HIGH (ref 11.5–14.5)
WBC: 3.9 x10 3/mm (ref 3.8–10.6)

## 2012-02-19 ENCOUNTER — Ambulatory Visit: Payer: Self-pay | Admitting: Oncology

## 2012-02-28 LAB — CBC CANCER CENTER
Basophil %: 0.4 %
Eosinophil #: 0.1 x10 3/mm (ref 0.0–0.7)
Eosinophil %: 1.5 %
Lymphocyte #: 1.6 x10 3/mm (ref 1.0–3.6)
Lymphocyte %: 16.2 %
MCH: 30.6 pg (ref 26.0–34.0)
MCHC: 31.9 g/dL — ABNORMAL LOW (ref 32.0–36.0)
MCV: 96 fL (ref 80–100)
Monocyte #: 1.2 x10 3/mm — ABNORMAL HIGH (ref 0.2–1.0)
Monocyte %: 12.4 %
Neutrophil #: 7 x10 3/mm — ABNORMAL HIGH (ref 1.4–6.5)
Platelet: 183 x10 3/mm (ref 150–440)
RBC: 3.16 10*6/uL — ABNORMAL LOW (ref 4.40–5.90)

## 2012-02-28 LAB — COMPREHENSIVE METABOLIC PANEL
Anion Gap: 12 (ref 7–16)
Calcium, Total: 8.8 mg/dL (ref 8.5–10.1)
Chloride: 104 mmol/L (ref 98–107)
Co2: 25 mmol/L (ref 21–32)
Creatinine: 1.47 mg/dL — ABNORMAL HIGH (ref 0.60–1.30)
EGFR (Non-African Amer.): 48 — ABNORMAL LOW
Osmolality: 284 (ref 275–301)
SGOT(AST): 17 U/L (ref 15–37)

## 2012-03-06 LAB — COMPREHENSIVE METABOLIC PANEL
Albumin: 3.3 g/dL — ABNORMAL LOW (ref 3.4–5.0)
Alkaline Phosphatase: 90 U/L (ref 50–136)
Anion Gap: 6 — ABNORMAL LOW (ref 7–16)
BUN: 27 mg/dL — ABNORMAL HIGH (ref 7–18)
Bilirubin,Total: 0.3 mg/dL (ref 0.2–1.0)
Calcium, Total: 8.7 mg/dL (ref 8.5–10.1)
Chloride: 106 mmol/L (ref 98–107)
Co2: 27 mmol/L (ref 21–32)
EGFR (African American): 55 — ABNORMAL LOW
EGFR (Non-African Amer.): 47 — ABNORMAL LOW
Glucose: 101 mg/dL — ABNORMAL HIGH (ref 65–99)
SGOT(AST): 19 U/L (ref 15–37)
Sodium: 139 mmol/L (ref 136–145)

## 2012-03-06 LAB — CBC CANCER CENTER
Basophil %: 0.5 %
HCT: 27.7 % — ABNORMAL LOW (ref 40.0–52.0)
HGB: 9 g/dL — ABNORMAL LOW (ref 13.0–18.0)
Lymphocyte #: 1.4 x10 3/mm (ref 1.0–3.6)
Lymphocyte %: 34.6 %
MCHC: 32.6 g/dL (ref 32.0–36.0)
Neutrophil %: 54.8 %
RDW: 23.3 % — ABNORMAL HIGH (ref 11.5–14.5)

## 2012-03-13 LAB — CBC CANCER CENTER
Lymphocyte #: 1.1 x10 3/mm (ref 1.0–3.6)
MCHC: 32.3 g/dL (ref 32.0–36.0)
MCV: 98 fL (ref 80–100)
Monocyte %: 14.2 %
Neutrophil #: 2.1 x10 3/mm (ref 1.4–6.5)
Neutrophil %: 55.6 %
WBC: 3.7 x10 3/mm — ABNORMAL LOW (ref 3.8–10.6)

## 2012-03-13 LAB — COMPREHENSIVE METABOLIC PANEL
Albumin: 3.3 g/dL — ABNORMAL LOW (ref 3.4–5.0)
BUN: 21 mg/dL — ABNORMAL HIGH (ref 7–18)
Bilirubin,Total: 0.3 mg/dL (ref 0.2–1.0)
Calcium, Total: 8.6 mg/dL (ref 8.5–10.1)
Chloride: 105 mmol/L (ref 98–107)
Creatinine: 1.53 mg/dL — ABNORMAL HIGH (ref 0.60–1.30)
EGFR (African American): 53 — ABNORMAL LOW
EGFR (Non-African Amer.): 46 — ABNORMAL LOW
Glucose: 123 mg/dL — ABNORMAL HIGH (ref 65–99)
Osmolality: 282 (ref 275–301)
Potassium: 4.5 mmol/L (ref 3.5–5.1)

## 2012-03-20 ENCOUNTER — Ambulatory Visit: Payer: Self-pay | Admitting: Oncology

## 2012-03-30 ENCOUNTER — Ambulatory Visit: Payer: Self-pay | Admitting: Oncology

## 2012-04-03 LAB — COMPREHENSIVE METABOLIC PANEL
Alkaline Phosphatase: 74 U/L (ref 50–136)
BUN: 25 mg/dL — ABNORMAL HIGH (ref 7–18)
Calcium, Total: 9 mg/dL (ref 8.5–10.1)
Co2: 24 mmol/L (ref 21–32)
EGFR (African American): 47 — ABNORMAL LOW
EGFR (Non-African Amer.): 40 — ABNORMAL LOW
SGOT(AST): 16 U/L (ref 15–37)
SGPT (ALT): 16 U/L
Sodium: 139 mmol/L (ref 136–145)
Total Protein: 7.3 g/dL (ref 6.4–8.2)

## 2012-04-03 LAB — CBC CANCER CENTER
Basophil #: 0 x10 3/mm (ref 0.0–0.1)
Basophil %: 0.7 %
Eosinophil #: 0.2 x10 3/mm (ref 0.0–0.7)
HCT: 30.6 % — ABNORMAL LOW (ref 40.0–52.0)
Lymphocyte %: 31.9 %
MCHC: 32.3 g/dL (ref 32.0–36.0)
Neutrophil #: 2.5 x10 3/mm (ref 1.4–6.5)
Neutrophil %: 46.3 %
Platelet: 232 x10 3/mm (ref 150–440)
RBC: 3.09 10*6/uL — ABNORMAL LOW (ref 4.40–5.90)
RDW: 21.1 % — ABNORMAL HIGH (ref 11.5–14.5)

## 2012-04-20 ENCOUNTER — Ambulatory Visit: Payer: Self-pay | Admitting: Oncology

## 2012-05-21 ENCOUNTER — Ambulatory Visit: Payer: Self-pay | Admitting: Oncology

## 2012-06-26 ENCOUNTER — Ambulatory Visit: Payer: Self-pay | Admitting: Oncology

## 2012-07-04 ENCOUNTER — Ambulatory Visit: Payer: Self-pay | Admitting: Oncology

## 2012-07-21 ENCOUNTER — Ambulatory Visit: Payer: Self-pay | Admitting: Oncology

## 2012-07-21 LAB — COMPREHENSIVE METABOLIC PANEL
Albumin: 3.5 g/dL (ref 3.4–5.0)
Alkaline Phosphatase: 67 U/L (ref 50–136)
Anion Gap: 11 (ref 7–16)
BUN: 27 mg/dL — ABNORMAL HIGH (ref 7–18)
Calcium, Total: 9.2 mg/dL (ref 8.5–10.1)
EGFR (African American): 43 — ABNORMAL LOW
Glucose: 87 mg/dL (ref 65–99)
Osmolality: 282 (ref 275–301)
Potassium: 4.5 mmol/L (ref 3.5–5.1)
SGOT(AST): 18 U/L (ref 15–37)
Sodium: 139 mmol/L (ref 136–145)
Total Protein: 7.5 g/dL (ref 6.4–8.2)

## 2012-08-03 LAB — CBC CANCER CENTER
Basophil #: 0 x10 3/mm (ref 0.0–0.1)
Eosinophil #: 0.2 x10 3/mm (ref 0.0–0.7)
HGB: 12.1 g/dL — ABNORMAL LOW (ref 13.0–18.0)
Lymphocyte %: 33.6 %
MCH: 30 pg (ref 26.0–34.0)
MCHC: 32.4 g/dL (ref 32.0–36.0)
Monocyte #: 0.8 x10 3/mm (ref 0.2–1.0)
Neutrophil %: 47.4 %
Platelet: 210 x10 3/mm (ref 150–440)
RBC: 4.05 10*6/uL — ABNORMAL LOW (ref 4.40–5.90)
RDW: 14.4 % (ref 11.5–14.5)
WBC: 5.7 x10 3/mm (ref 3.8–10.6)

## 2012-08-03 LAB — COMPREHENSIVE METABOLIC PANEL
Albumin: 3.6 g/dL (ref 3.4–5.0)
Alkaline Phosphatase: 76 U/L (ref 50–136)
BUN: 26 mg/dL — ABNORMAL HIGH (ref 7–18)
Chloride: 105 mmol/L (ref 98–107)
Creatinine: 1.89 mg/dL — ABNORMAL HIGH (ref 0.60–1.30)
Glucose: 95 mg/dL (ref 65–99)
SGOT(AST): 17 U/L (ref 15–37)
SGPT (ALT): 21 U/L (ref 12–78)
Sodium: 139 mmol/L (ref 136–145)

## 2012-08-03 LAB — URINALYSIS, COMPLETE
RBC,UR: 33117 /HPF (ref 0–5)
WBC UR: 122 /HPF (ref 0–5)

## 2012-08-03 LAB — PROTIME-INR: INR: 0.9

## 2012-08-20 ENCOUNTER — Ambulatory Visit: Payer: Self-pay | Admitting: Oncology

## 2012-08-21 LAB — COMPREHENSIVE METABOLIC PANEL
Albumin: 3.5 g/dL (ref 3.4–5.0)
Alkaline Phosphatase: 74 U/L (ref 50–136)
BUN: 30 mg/dL — ABNORMAL HIGH (ref 7–18)
Bilirubin,Total: 0.5 mg/dL (ref 0.2–1.0)
Chloride: 105 mmol/L (ref 98–107)
Co2: 26 mmol/L (ref 21–32)
Creatinine: 1.82 mg/dL — ABNORMAL HIGH (ref 0.60–1.30)
EGFR (African American): 43 — ABNORMAL LOW
EGFR (Non-African Amer.): 37 — ABNORMAL LOW
Glucose: 88 mg/dL (ref 65–99)
SGPT (ALT): 22 U/L (ref 12–78)
Sodium: 141 mmol/L (ref 136–145)

## 2012-09-08 ENCOUNTER — Ambulatory Visit (INDEPENDENT_AMBULATORY_CARE_PROVIDER_SITE_OTHER): Payer: Medicare Other | Admitting: Family Medicine

## 2012-09-08 ENCOUNTER — Encounter: Payer: Self-pay | Admitting: Family Medicine

## 2012-09-08 VITALS — BP 118/64 | HR 98 | Temp 97.6°F | Ht 71.0 in | Wt 197.5 lb

## 2012-09-08 DIAGNOSIS — J029 Acute pharyngitis, unspecified: Secondary | ICD-10-CM

## 2012-09-08 DIAGNOSIS — J329 Chronic sinusitis, unspecified: Secondary | ICD-10-CM

## 2012-09-08 LAB — POCT RAPID STREP A (OFFICE): Rapid Strep A Screen: NEGATIVE

## 2012-09-08 MED ORDER — AZITHROMYCIN 250 MG PO TABS: ORAL_TABLET | ORAL | Status: DC

## 2012-09-08 NOTE — Progress Notes (Signed)
Sx started with some nausea earlier in the week.  Then ST and head congestion.  Some post nasal gtt.  No ear pain.  No fevers.  Some mild aches.  He has had some tooth sensitivity, upper teeth.  He has felt cold but no sweats.  Some wheeze, usually only when sick.  Stopped smoking.  White sputum.    Meds, vitals, and allergies reviewed.   ROS: See HPI.  Otherwise, noncontributory.  GEN: nad, alert and oriented HEENT: mucous membranes moist, tm w/o erythema, nasal exam w/o erythema, clear discharge noted,  OP with cobblestoning, L max sinus ttp NECK: supple w/o LA CV: rrr.   PULM: ctab, no inc wob EXT: no edema SKIN: no acute rash but he has what appears to be bruise on [name] but per patient this has been present for about 1 year, occ with changes in size.

## 2012-09-08 NOTE — Patient Instructions (Addendum)
Start the antibiotics today and drink plenty of fluids.  Get some rest.  Take care.

## 2012-09-09 ENCOUNTER — Telehealth: Payer: Self-pay | Admitting: Family Medicine

## 2012-09-09 DIAGNOSIS — J329 Chronic sinusitis, unspecified: Secondary | ICD-10-CM | POA: Insufficient documentation

## 2012-09-09 NOTE — Telephone Encounter (Signed)
Call pt.  I talked to [facility] about the spot on [name].  If it doesn't resolve, then I want him to see Alphonsus Sias about it. Alphonsus Sias would like to check it again if it doesn't resolve.

## 2012-09-09 NOTE — Assessment & Plan Note (Signed)
Given the tenderness, I would start zmax and f/u prn.  Supportive tx o/w.  I'll notify PCP about the skin changes for his input. I don't know what is causing this.  Pt agrees with plan.

## 2012-09-11 MED ORDER — BENZONATATE 200 MG PO CAPS: 200.0000 mg | ORAL_CAPSULE | Freq: Three times a day (TID) | ORAL | Status: AC | PRN

## 2012-09-11 NOTE — Telephone Encounter (Signed)
Would try claritin 10mg  a day (otc) for the drip and tessalon (rx sent) for the cough.  Thanks.

## 2012-09-11 NOTE — Telephone Encounter (Signed)
Wife advised. 

## 2012-09-11 NOTE — Telephone Encounter (Signed)
Patient advised.  While speaking with him, he asked if he could get anything for his cough.  He says he does pretty good during the day but when he lays down at night, his head congestion starts draining and he can't stop coughing.  [facility] [address] [place].

## 2012-09-20 ENCOUNTER — Ambulatory Visit: Payer: Self-pay | Admitting: Oncology

## 2012-09-20 DIAGNOSIS — C679 Malignant neoplasm of bladder, unspecified: Secondary | ICD-10-CM

## 2012-09-20 HISTORY — DX: Malignant neoplasm of bladder, unspecified: C67.9

## 2012-09-20 HISTORY — PX: TRANSURETHRAL RESECTION OF BLADDER TUMOR: SHX2575

## 2012-09-26 ENCOUNTER — Ambulatory Visit: Payer: Self-pay | Admitting: Oncology

## 2012-10-04 ENCOUNTER — Ambulatory Visit: Payer: Self-pay | Admitting: Urology

## 2012-10-04 LAB — CBC
HCT: 32.2 % — ABNORMAL LOW (ref 40.0–52.0)
HGB: 10.7 g/dL — ABNORMAL LOW (ref 13.0–18.0)
MCHC: 33.3 g/dL (ref 32.0–36.0)
MCV: 89 fL (ref 80–100)
RDW: 14.3 % (ref 11.5–14.5)
WBC: 7.9 10*3/uL (ref 3.8–10.6)

## 2012-10-06 LAB — COMPREHENSIVE METABOLIC PANEL
Alkaline Phosphatase: 81 U/L (ref 50–136)
Anion Gap: 10 (ref 7–16)
BUN: 26 mg/dL — ABNORMAL HIGH (ref 7–18)
Bilirubin,Total: 0.4 mg/dL (ref 0.2–1.0)
Chloride: 104 mmol/L (ref 98–107)
Creatinine: 1.99 mg/dL — ABNORMAL HIGH (ref 0.60–1.30)
Glucose: 109 mg/dL — ABNORMAL HIGH (ref 65–99)
Osmolality: 283 (ref 275–301)
Sodium: 139 mmol/L (ref 136–145)
Total Protein: 7.5 g/dL (ref 6.4–8.2)

## 2012-10-06 LAB — CBC CANCER CENTER
Basophil #: 0 x10 3/mm (ref 0.0–0.1)
Eosinophil #: 0.3 x10 3/mm (ref 0.0–0.7)
Eosinophil %: 4.1 %
HCT: 32.5 % — ABNORMAL LOW (ref 40.0–52.0)
MCH: 30 pg (ref 26.0–34.0)
MCHC: 33.5 g/dL (ref 32.0–36.0)
MCV: 90 fL (ref 80–100)
Neutrophil %: 57.4 %
WBC: 7 x10 3/mm (ref 3.8–10.6)

## 2012-10-09 ENCOUNTER — Ambulatory Visit: Payer: Self-pay | Admitting: Urology

## 2012-10-10 LAB — PATHOLOGY REPORT

## 2012-10-16 LAB — CBC CANCER CENTER
Basophil %: 0.5 %
Eosinophil #: 0.4 x10 3/mm (ref 0.0–0.7)
Eosinophil %: 4.9 %
HGB: 10.5 g/dL — ABNORMAL LOW (ref 13.0–18.0)
Lymphocyte #: 1.8 x10 3/mm (ref 1.0–3.6)
Lymphocyte %: 25.1 %
MCH: 29.4 pg (ref 26.0–34.0)
Monocyte #: 1 x10 3/mm (ref 0.2–1.0)
Monocyte %: 13.3 %
Neutrophil #: 4.1 x10 3/mm (ref 1.4–6.5)
Platelet: 277 x10 3/mm (ref 150–440)
RDW: 14.1 % (ref 11.5–14.5)
WBC: 7.3 x10 3/mm (ref 3.8–10.6)

## 2012-10-16 LAB — COMPREHENSIVE METABOLIC PANEL
Alkaline Phosphatase: 70 U/L (ref 50–136)
Anion Gap: 9 (ref 7–16)
BUN: 26 mg/dL — ABNORMAL HIGH (ref 7–18)
Calcium, Total: 8.7 mg/dL (ref 8.5–10.1)
Chloride: 105 mmol/L (ref 98–107)
Glucose: 92 mg/dL (ref 65–99)
Osmolality: 284 (ref 275–301)
Potassium: 4 mmol/L (ref 3.5–5.1)
Sodium: 140 mmol/L (ref 136–145)

## 2012-10-21 ENCOUNTER — Ambulatory Visit: Payer: Self-pay | Admitting: Oncology

## 2012-10-23 LAB — CBC CANCER CENTER
Basophil %: 0.1 %
Eosinophil #: 0.6 x10 3/mm (ref 0.0–0.7)
MCH: 29.6 pg (ref 26.0–34.0)
MCHC: 33 g/dL (ref 32.0–36.0)
MCV: 90 fL (ref 80–100)
Monocyte %: 1.3 %
Neutrophil #: 5.4 x10 3/mm (ref 1.4–6.5)
Neutrophil %: 79.3 %
RBC: 3.61 10*6/uL — ABNORMAL LOW (ref 4.40–5.90)
RDW: 14.4 % (ref 11.5–14.5)
WBC: 6.8 x10 3/mm (ref 3.8–10.6)

## 2012-10-23 LAB — COMPREHENSIVE METABOLIC PANEL
Albumin: 3 g/dL — ABNORMAL LOW (ref 3.4–5.0)
Alkaline Phosphatase: 81 U/L (ref 50–136)
Anion Gap: 8 (ref 7–16)
BUN: 47 mg/dL — ABNORMAL HIGH (ref 7–18)
Co2: 26 mmol/L (ref 21–32)
Creatinine: 1.86 mg/dL — ABNORMAL HIGH (ref 0.60–1.30)
EGFR (African American): 42 — ABNORMAL LOW
EGFR (Non-African Amer.): 36 — ABNORMAL LOW
Osmolality: 289 (ref 275–301)

## 2012-10-30 LAB — CBC CANCER CENTER
Basophil #: 0.1 x10 3/mm (ref 0.0–0.1)
Basophil %: 1.5 %
Eosinophil #: 0.5 x10 3/mm (ref 0.0–0.7)
Eosinophil %: 6.7 %
HCT: 30.5 % — ABNORMAL LOW (ref 40.0–52.0)
Lymphocyte #: 1.8 x10 3/mm (ref 1.0–3.6)
Lymphocyte %: 24.7 %
MCH: 29.9 pg (ref 26.0–34.0)
MCV: 91 fL (ref 80–100)
Monocyte %: 26.6 %
Platelet: 276 x10 3/mm (ref 150–440)
RDW: 15.3 % — ABNORMAL HIGH (ref 11.5–14.5)

## 2012-10-30 LAB — COMPREHENSIVE METABOLIC PANEL
BUN: 28 mg/dL — ABNORMAL HIGH (ref 7–18)
Bilirubin,Total: 0.4 mg/dL (ref 0.2–1.0)
Chloride: 104 mmol/L (ref 98–107)
Co2: 24 mmol/L (ref 21–32)
Creatinine: 1.64 mg/dL — ABNORMAL HIGH (ref 0.60–1.30)
EGFR (Non-African Amer.): 42 — ABNORMAL LOW
Potassium: 4.6 mmol/L (ref 3.5–5.1)
SGOT(AST): 18 U/L (ref 15–37)
SGPT (ALT): 47 U/L (ref 12–78)
Sodium: 138 mmol/L (ref 136–145)

## 2012-11-01 ENCOUNTER — Ambulatory Visit (INDEPENDENT_AMBULATORY_CARE_PROVIDER_SITE_OTHER): Payer: Medicare Other | Admitting: Internal Medicine

## 2012-11-01 ENCOUNTER — Encounter: Payer: Self-pay | Admitting: Internal Medicine

## 2012-11-01 VITALS — BP 130/62 | HR 69 | Temp 98.3°F | Ht 71.0 in | Wt 197.0 lb

## 2012-11-01 DIAGNOSIS — C679 Malignant neoplasm of bladder, unspecified: Secondary | ICD-10-CM

## 2012-11-01 DIAGNOSIS — J449 Chronic obstructive pulmonary disease, unspecified: Secondary | ICD-10-CM

## 2012-11-01 DIAGNOSIS — E785 Hyperlipidemia, unspecified: Secondary | ICD-10-CM

## 2012-11-01 DIAGNOSIS — L570 Actinic keratosis: Secondary | ICD-10-CM

## 2012-11-01 DIAGNOSIS — Z0000 Encounter for general adult medical examination without abnormal findings: Secondary | ICD-10-CM | POA: Insufficient documentation

## 2012-11-01 DIAGNOSIS — Z1211 Encounter for screening for malignant neoplasm of colon: Secondary | ICD-10-CM

## 2012-11-01 DIAGNOSIS — N400 Enlarged prostate without lower urinary tract symptoms: Secondary | ICD-10-CM

## 2012-11-01 LAB — LIPID PANEL
Cholesterol: 199 mg/dL (ref 0–200)
LDL Cholesterol: 112 mg/dL — ABNORMAL HIGH (ref 0–99)
Triglycerides: 90 mg/dL (ref 0.0–149.0)

## 2012-11-01 LAB — HEPATIC FUNCTION PANEL
AST: 35 U/L (ref 0–37)
Albumin: 3.2 g/dL — ABNORMAL LOW (ref 3.5–5.2)
Alkaline Phosphatase: 53 U/L (ref 39–117)
Total Protein: 7.1 g/dL (ref 6.0–8.3)

## 2012-11-01 MED ORDER — TRIAMCINOLONE ACETONIDE 0.1 % EX CREA: TOPICAL_CREAM | Freq: Two times a day (BID) | CUTANEOUS | Status: DC

## 2012-11-01 NOTE — Progress Notes (Signed)
Subjective:    Patient ID: Cody Barry, male    DOB: Feb 21, 1946, 67 y.o.   MRN: [id number]  HPI Here for physical Wife here Has restarted chemo for bladder cancer with Dr Orlie Dakin Sounds like he gets neulasta also  Still works--farms No changes in FH  Voiding okay Nocturia is increased after chemo--then settles down No incontinence  Breathing okay in general Gets DOE at times--like steps. Not limiting No regular cough  No current outpatient prescriptions on file prior to visit.   No current facility-administered medications on file prior to visit.    Allergies  Allergen Reactions  . Penicillins     Past Medical History  Diagnosis Date  . ED (erectile dysfunction)   . BPH (benign prostatic hypertrophy)   . Bladder cancer   . COPD (chronic obstructive pulmonary disease)   . Hyperlipidemia     Past Surgical History  Procedure Laterality Date  . Umbilical hernia repair      Family History  Problem Relation Age of Onset  . Hypertension Mother   . Coronary artery disease Neg Hx   . Diabetes Neg Hx   . Cancer Neg Hx     History   Social History  . Marital Status: Married    Spouse Name: N/A    Number of Children: 3  . Years of Education: N/A   Occupational History  . farmer- grain    Social History Main Topics  . Smoking status: Former Smoker    Types: Cigarettes    Quit date: 09/20/2006  . Smokeless tobacco: Never Used  . Alcohol Use: Yes     Comment: rare  . Drug Use: No  . Sexually Active: Not on file   Other Topics Concern  . Not on file   Social History Narrative   No living will   Would want wife as health care POA   Would want attempts at resuscitation   Not sure about tube feedings   Review of Systems  Constitutional: Negative for fatigue and unexpected weight change.       Weight fairly stable Wears seat belt  HENT: Positive for hearing loss and dental problem. Negative for tinnitus.        Getting Rx for gum problems  and loose teeth  Eyes: Negative for visual disturbance.       No diplopia or unilateral vision loss Mild blurry vision--he relates to cataracts  Respiratory: Positive for shortness of breath. Negative for cough and chest tightness.   Cardiovascular: Negative for chest pain, palpitations and leg swelling.  Gastrointestinal: Negative for nausea, vomiting, abdominal pain, constipation and blood in stool.       No heartburn  Endocrine: Positive for cold intolerance. Negative for heat intolerance.  Genitourinary: Negative for urgency, frequency and difficulty urinating.  Musculoskeletal: Negative for back pain, joint swelling and arthralgias.  Skin: Positive for rash.       Persistent dark hard area along left wrist---waxes and wanes  Ongoing scaly, itchy area on left cheek  Neurological: Negative for dizziness, syncope, weakness, light-headedness, numbness and headaches.  Hematological: Negative for adenopathy. Does not bruise/bleed easily.  Psychiatric/Behavioral: Positive for sleep disturbance. Negative for dysphoric mood. The patient is not nervous/anxious.        Gets up at 3AM--often has problems reinitiating       Objective:   Physical Exam  Constitutional: He is oriented to person, place, and time. He appears well-developed and well-nourished. No distress.  HENT:  Head: Normocephalic and  atraumatic.  Right Ear: External ear normal.  Left Ear: External ear normal.  Mouth/Throat: Oropharynx is clear and moist. No oropharyngeal exudate.  Eyes: Conjunctivae and EOM are normal. Pupils are equal, round, and reactive to light.  Cataracts seen OU  Neck: Normal range of motion. Neck supple. No thyromegaly present.  Cardiovascular: Normal rate, regular rhythm, normal heart sounds and intact distal pulses.  Exam reveals no gallop.   No murmur heard. Pulmonary/Chest: Effort normal and breath sounds normal. No respiratory distress. He has no wheezes. He has no rales.  Abdominal: Soft. There  is no tenderness.  Musculoskeletal: He exhibits no edema and no tenderness.  Lymphadenopathy:    He has no cervical adenopathy.  Neurological: He is alert and oriented to person, place, and time.  Skin: No rash noted. No erythema.  hyperpigmented area on extensor left wrist Actinic on left cheek  Psychiatric: He has a normal mood and affect. His behavior is normal.          Assessment & Plan:

## 2012-11-01 NOTE — Assessment & Plan Note (Signed)
Generally healthy Sees Dr Artis Flock who must be checking PSA Will check stool immunoassay

## 2012-11-01 NOTE — Assessment & Plan Note (Signed)
Lesion on left cheek treated with liquid nitrogen for 60 seconds x 2 Tolerated well If recurs, will need derm

## 2012-11-01 NOTE — Assessment & Plan Note (Signed)
Getting Rx again from Dr Orlie Dakin

## 2012-11-01 NOTE — Assessment & Plan Note (Signed)
Mild DOE Rx not indicated

## 2012-11-01 NOTE — Assessment & Plan Note (Signed)
Mild recurrence of voiding symptoms Not like before though

## 2012-11-06 ENCOUNTER — Encounter: Payer: Self-pay | Admitting: *Deleted

## 2012-11-06 LAB — CBC CANCER CENTER
Basophil #: 0 x10 3/mm (ref 0.0–0.1)
HCT: 33.4 % — ABNORMAL LOW (ref 40.0–52.0)
HGB: 11.1 g/dL — ABNORMAL LOW (ref 13.0–18.0)
Lymphocyte %: 34.2 %
MCH: 30 pg (ref 26.0–34.0)
MCHC: 33.4 g/dL (ref 32.0–36.0)
MCV: 90 fL (ref 80–100)
Monocyte #: 0.5 x10 3/mm (ref 0.2–1.0)
Platelet: 243 x10 3/mm (ref 150–440)
RBC: 3.71 10*6/uL — ABNORMAL LOW (ref 4.40–5.90)

## 2012-11-06 LAB — COMPREHENSIVE METABOLIC PANEL
Anion Gap: 10 (ref 7–16)
BUN: 32 mg/dL — ABNORMAL HIGH (ref 7–18)
Bilirubin,Total: 0.4 mg/dL (ref 0.2–1.0)
Calcium, Total: 9.2 mg/dL (ref 8.5–10.1)
Co2: 26 mmol/L (ref 21–32)
Creatinine: 1.57 mg/dL — ABNORMAL HIGH (ref 0.60–1.30)
EGFR (African American): 51 — ABNORMAL LOW
EGFR (Non-African Amer.): 44 — ABNORMAL LOW
Glucose: 86 mg/dL (ref 65–99)
Potassium: 5.1 mmol/L (ref 3.5–5.1)
SGOT(AST): 14 U/L — ABNORMAL LOW (ref 15–37)
SGPT (ALT): 29 U/L (ref 12–78)
Sodium: 138 mmol/L (ref 136–145)
Total Protein: 7.6 g/dL (ref 6.4–8.2)

## 2012-11-06 LAB — TSH: Thyroid Stimulating Horm: 1.41 u[IU]/mL

## 2012-11-13 LAB — CBC CANCER CENTER
Basophil #: 0.1 x10 3/mm (ref 0.0–0.1)
Eosinophil #: 0.1 x10 3/mm (ref 0.0–0.7)
Eosinophil %: 0.7 %
HCT: 31.7 % — ABNORMAL LOW (ref 40.0–52.0)
Lymphocyte %: 16 %
MCH: 29.7 pg (ref 26.0–34.0)
MCHC: 32.7 g/dL (ref 32.0–36.0)
MCV: 91 fL (ref 80–100)
Monocyte %: 16 %
Platelet: 184 x10 3/mm (ref 150–440)
RDW: 16 % — ABNORMAL HIGH (ref 11.5–14.5)
WBC: 12 x10 3/mm — ABNORMAL HIGH (ref 3.8–10.6)

## 2012-11-13 LAB — BASIC METABOLIC PANEL
Anion Gap: 6 — ABNORMAL LOW (ref 7–16)
BUN: 27 mg/dL — ABNORMAL HIGH (ref 7–18)
Calcium, Total: 8.7 mg/dL (ref 8.5–10.1)
Chloride: 109 mmol/L — ABNORMAL HIGH (ref 98–107)
EGFR (Non-African Amer.): 44 — ABNORMAL LOW
Glucose: 80 mg/dL (ref 65–99)
Osmolality: 280 (ref 275–301)
Sodium: 138 mmol/L (ref 136–145)

## 2012-11-18 ENCOUNTER — Ambulatory Visit: Payer: Self-pay | Admitting: Oncology

## 2012-11-21 LAB — COMPREHENSIVE METABOLIC PANEL
Albumin: 3.2 g/dL — ABNORMAL LOW (ref 3.4–5.0)
Bilirubin,Total: 0.2 mg/dL (ref 0.2–1.0)
Calcium, Total: 8.6 mg/dL (ref 8.5–10.1)
Chloride: 104 mmol/L (ref 98–107)
EGFR (African American): 46 — ABNORMAL LOW
EGFR (Non-African Amer.): 40 — ABNORMAL LOW
Glucose: 86 mg/dL (ref 65–99)
Osmolality: 282 (ref 275–301)
SGPT (ALT): 20 U/L (ref 12–78)
Sodium: 139 mmol/L (ref 136–145)

## 2012-11-21 LAB — CBC CANCER CENTER
Basophil #: 0.1 x10 3/mm (ref 0.0–0.1)
Eosinophil #: 0.1 x10 3/mm (ref 0.0–0.7)
Eosinophil %: 1.8 %
HCT: 30.4 % — ABNORMAL LOW (ref 40.0–52.0)
Lymphocyte %: 17.8 %
MCH: 30.1 pg (ref 26.0–34.0)
Monocyte #: 0.9 x10 3/mm (ref 0.2–1.0)
Monocyte %: 11.6 %
Neutrophil #: 5.1 x10 3/mm (ref 1.4–6.5)
Neutrophil %: 67.5 %
Platelet: 252 x10 3/mm (ref 150–440)

## 2012-11-23 ENCOUNTER — Other Ambulatory Visit (INDEPENDENT_AMBULATORY_CARE_PROVIDER_SITE_OTHER): Payer: Medicare Other

## 2012-11-23 DIAGNOSIS — Z1211 Encounter for screening for malignant neoplasm of colon: Secondary | ICD-10-CM

## 2012-11-28 ENCOUNTER — Encounter: Payer: Self-pay | Admitting: *Deleted

## 2012-11-28 LAB — CBC CANCER CENTER
Eosinophil #: 0.3 x10 3/mm (ref 0.0–0.7)
Eosinophil %: 6.2 %
HCT: 32 % — ABNORMAL LOW (ref 40.0–52.0)
Lymphocyte #: 1.3 x10 3/mm (ref 1.0–3.6)
MCHC: 32.6 g/dL (ref 32.0–36.0)
Monocyte #: 0.6 x10 3/mm (ref 0.2–1.0)
Monocyte %: 15.2 %
Neutrophil #: 1.9 x10 3/mm (ref 1.4–6.5)
Neutrophil %: 47.1 %
Platelet: 199 x10 3/mm (ref 150–440)
RDW: 16.6 % — ABNORMAL HIGH (ref 11.5–14.5)

## 2012-11-28 LAB — COMPREHENSIVE METABOLIC PANEL
Alkaline Phosphatase: 73 U/L (ref 50–136)
BUN: 26 mg/dL — ABNORMAL HIGH (ref 7–18)
Bilirubin,Total: 0.3 mg/dL (ref 0.2–1.0)
Calcium, Total: 8.8 mg/dL (ref 8.5–10.1)
Chloride: 104 mmol/L (ref 98–107)
Co2: 26 mmol/L (ref 21–32)
Creatinine: 1.69 mg/dL — ABNORMAL HIGH (ref 0.60–1.30)
EGFR (Non-African Amer.): 41 — ABNORMAL LOW
Glucose: 84 mg/dL (ref 65–99)
Potassium: 4.5 mmol/L (ref 3.5–5.1)
SGPT (ALT): 27 U/L (ref 12–78)
Sodium: 138 mmol/L (ref 136–145)
Total Protein: 7.2 g/dL (ref 6.4–8.2)

## 2012-12-05 LAB — COMPREHENSIVE METABOLIC PANEL
Albumin: 3.5 g/dL (ref 3.4–5.0)
BUN: 33 mg/dL — ABNORMAL HIGH (ref 7–18)
EGFR (African American): 49 — ABNORMAL LOW
Glucose: 75 mg/dL (ref 65–99)
Osmolality: 289 (ref 275–301)
SGOT(AST): 15 U/L (ref 15–37)
SGPT (ALT): 25 U/L (ref 12–78)
Total Protein: 7 g/dL (ref 6.4–8.2)

## 2012-12-05 LAB — CBC CANCER CENTER
Basophil %: 0.8 %
Eosinophil #: 0.4 x10 3/mm (ref 0.0–0.7)
Eosinophil %: 12.2 %
HGB: 10.2 g/dL — ABNORMAL LOW (ref 13.0–18.0)
Lymphocyte #: 1.3 x10 3/mm (ref 1.0–3.6)
MCHC: 32.7 g/dL (ref 32.0–36.0)
Monocyte #: 0.7 x10 3/mm (ref 0.2–1.0)
Monocyte %: 17.9 %
Neutrophil %: 32.3 %
RDW: 18.3 % — ABNORMAL HIGH (ref 11.5–14.5)
WBC: 3.7 x10 3/mm — ABNORMAL LOW (ref 3.8–10.6)

## 2012-12-08 ENCOUNTER — Encounter: Payer: Self-pay | Admitting: Internal Medicine

## 2012-12-12 LAB — COMPREHENSIVE METABOLIC PANEL
Albumin: 3.4 g/dL (ref 3.4–5.0)
Alkaline Phosphatase: 96 U/L (ref 50–136)
Anion Gap: 10 (ref 7–16)
BUN: 24 mg/dL — ABNORMAL HIGH (ref 7–18)
Bilirubin,Total: 0.3 mg/dL (ref 0.2–1.0)
Chloride: 105 mmol/L (ref 98–107)
Creatinine: 1.75 mg/dL — ABNORMAL HIGH (ref 0.60–1.30)
EGFR (African American): 45 — ABNORMAL LOW
Osmolality: 285 (ref 275–301)
Potassium: 4.3 mmol/L (ref 3.5–5.1)
SGOT(AST): 17 U/L (ref 15–37)
SGPT (ALT): 19 U/L (ref 12–78)
Sodium: 141 mmol/L (ref 136–145)

## 2012-12-12 LAB — CBC CANCER CENTER
Basophil %: 0.7 %
Eosinophil #: 0.1 x10 3/mm (ref 0.0–0.7)
Eosinophil %: 0.9 %
HCT: 32.4 % — ABNORMAL LOW (ref 40.0–52.0)
HGB: 10.5 g/dL — ABNORMAL LOW (ref 13.0–18.0)
Lymphocyte %: 10.6 %
MCHC: 32.3 g/dL (ref 32.0–36.0)
Monocyte %: 18.2 %
Platelet: 181 x10 3/mm (ref 150–440)
WBC: 14.2 x10 3/mm — ABNORMAL HIGH (ref 3.8–10.6)

## 2012-12-19 ENCOUNTER — Ambulatory Visit: Payer: Self-pay | Admitting: Oncology

## 2012-12-19 LAB — CBC CANCER CENTER
Basophil #: 0.1 x10 3/mm (ref 0.0–0.1)
Eosinophil #: 0.1 x10 3/mm (ref 0.0–0.7)
Eosinophil %: 2.1 %
HCT: 30.7 % — ABNORMAL LOW (ref 40.0–52.0)
Lymphocyte #: 1.4 x10 3/mm (ref 1.0–3.6)
Lymphocyte %: 22.6 %
MCV: 92 fL (ref 80–100)
Monocyte #: 0.7 x10 3/mm (ref 0.2–1.0)
Neutrophil %: 62.6 %
Platelet: 253 x10 3/mm (ref 150–440)
RBC: 3.35 10*6/uL — ABNORMAL LOW (ref 4.40–5.90)
RDW: 18.7 % — ABNORMAL HIGH (ref 11.5–14.5)
WBC: 6.2 x10 3/mm (ref 3.8–10.6)

## 2012-12-19 LAB — COMPREHENSIVE METABOLIC PANEL
Alkaline Phosphatase: 74 U/L (ref 50–136)
Anion Gap: 6 — ABNORMAL LOW (ref 7–16)
Calcium, Total: 8.7 mg/dL (ref 8.5–10.1)
EGFR (African American): 49 — ABNORMAL LOW
EGFR (Non-African Amer.): 43 — ABNORMAL LOW
Glucose: 86 mg/dL (ref 65–99)
Osmolality: 286 (ref 275–301)
Potassium: 4.8 mmol/L (ref 3.5–5.1)
SGOT(AST): 17 U/L (ref 15–37)
SGPT (ALT): 27 U/L (ref 12–78)
Sodium: 140 mmol/L (ref 136–145)

## 2012-12-26 LAB — COMPREHENSIVE METABOLIC PANEL
Alkaline Phosphatase: 72 U/L (ref 50–136)
Anion Gap: 6 — ABNORMAL LOW (ref 7–16)
BUN: 26 mg/dL — ABNORMAL HIGH (ref 7–18)
Bilirubin,Total: 0.2 mg/dL (ref 0.2–1.0)
Calcium, Total: 8.3 mg/dL — ABNORMAL LOW (ref 8.5–10.1)
Co2: 28 mmol/L (ref 21–32)
Creatinine: 1.78 mg/dL — ABNORMAL HIGH (ref 0.60–1.30)
EGFR (Non-African Amer.): 38 — ABNORMAL LOW
Osmolality: 283 (ref 275–301)
Potassium: 4.7 mmol/L (ref 3.5–5.1)
SGOT(AST): 18 U/L (ref 15–37)
SGPT (ALT): 26 U/L (ref 12–78)
Sodium: 140 mmol/L (ref 136–145)
Total Protein: 6.9 g/dL (ref 6.4–8.2)

## 2012-12-26 LAB — CBC CANCER CENTER
HGB: 10.1 g/dL — ABNORMAL LOW (ref 13.0–18.0)
Lymphocyte #: 1.3 x10 3/mm (ref 1.0–3.6)
Lymphocyte %: 32.9 %
MCH: 30.2 pg (ref 26.0–34.0)
MCHC: 32.6 g/dL (ref 32.0–36.0)
MCV: 93 fL (ref 80–100)
Monocyte %: 14.1 %
Neutrophil #: 1.8 x10 3/mm (ref 1.4–6.5)
RBC: 3.36 10*6/uL — ABNORMAL LOW (ref 4.40–5.90)
RDW: 19.7 % — ABNORMAL HIGH (ref 11.5–14.5)
WBC: 3.9 x10 3/mm (ref 3.8–10.6)

## 2013-01-01 ENCOUNTER — Ambulatory Visit: Payer: Self-pay | Admitting: Oncology

## 2013-01-01 LAB — COMPREHENSIVE METABOLIC PANEL
Albumin: 3.5 g/dL (ref 3.4–5.0)
Alkaline Phosphatase: 86 U/L (ref 50–136)
Anion Gap: 10 (ref 7–16)
Chloride: 105 mmol/L (ref 98–107)
Co2: 25 mmol/L (ref 21–32)
Creatinine: 1.74 mg/dL — ABNORMAL HIGH (ref 0.60–1.30)
EGFR (African American): 45 — ABNORMAL LOW
EGFR (Non-African Amer.): 39 — ABNORMAL LOW
Potassium: 4.8 mmol/L (ref 3.5–5.1)
SGOT(AST): 18 U/L (ref 15–37)
SGPT (ALT): 27 U/L (ref 12–78)
Total Protein: 7 g/dL (ref 6.4–8.2)

## 2013-01-01 LAB — CBC CANCER CENTER
Basophil #: 0 x10 3/mm (ref 0.0–0.1)
Eosinophil #: 0.3 x10 3/mm (ref 0.0–0.7)
Eosinophil %: 9.3 %
HGB: 10.1 g/dL — ABNORMAL LOW (ref 13.0–18.0)
MCV: 92 fL (ref 80–100)
Monocyte #: 0.2 x10 3/mm (ref 0.2–1.0)
Neutrophil %: 55.2 %
Platelet: 109 x10 3/mm — ABNORMAL LOW (ref 150–440)
WBC: 3 x10 3/mm — ABNORMAL LOW (ref 3.8–10.6)

## 2013-01-09 LAB — CBC CANCER CENTER
Bands: 11 %
Eosinophil: 1 %
HCT: 30.5 % — ABNORMAL LOW (ref 40.0–52.0)
HGB: 9.7 g/dL — ABNORMAL LOW (ref 13.0–18.0)
MCH: 29.8 pg (ref 26.0–34.0)
MCHC: 31.9 g/dL — ABNORMAL LOW (ref 32.0–36.0)
Monocytes: 12 %
RDW: 20.8 % — ABNORMAL HIGH (ref 11.5–14.5)
Segmented Neutrophils: 65 %
Variant Lymphocyte: 1 %
WBC: 14.2 x10 3/mm — ABNORMAL HIGH (ref 3.8–10.6)

## 2013-01-09 LAB — COMPREHENSIVE METABOLIC PANEL
Albumin: 3.5 g/dL (ref 3.4–5.0)
BUN: 21 mg/dL — ABNORMAL HIGH (ref 7–18)
Co2: 25 mmol/L (ref 21–32)
Glucose: 88 mg/dL (ref 65–99)
Osmolality: 280 (ref 275–301)
Sodium: 139 mmol/L (ref 136–145)
Total Protein: 7.4 g/dL (ref 6.4–8.2)

## 2013-01-16 LAB — CBC CANCER CENTER
Basophil %: 1 %
Eosinophil #: 0.1 x10 3/mm (ref 0.0–0.7)
HCT: 29.3 % — ABNORMAL LOW (ref 40.0–52.0)
Lymphocyte #: 1.1 x10 3/mm (ref 1.0–3.6)
Lymphocyte %: 24.1 %
MCH: 29.9 pg (ref 26.0–34.0)
MCV: 94 fL (ref 80–100)
Neutrophil #: 2.9 x10 3/mm (ref 1.4–6.5)
Neutrophil %: 61.8 %
Platelet: 233 x10 3/mm (ref 150–440)

## 2013-01-16 LAB — COMPREHENSIVE METABOLIC PANEL
Albumin: 3.2 g/dL — ABNORMAL LOW (ref 3.4–5.0)
Alkaline Phosphatase: 66 U/L (ref 50–136)
Anion Gap: 5 — ABNORMAL LOW (ref 7–16)
BUN: 29 mg/dL — ABNORMAL HIGH (ref 7–18)
Calcium, Total: 8.4 mg/dL — ABNORMAL LOW (ref 8.5–10.1)
Chloride: 111 mmol/L — ABNORMAL HIGH (ref 98–107)
Glucose: 82 mg/dL (ref 65–99)
SGOT(AST): 21 U/L (ref 15–37)
Sodium: 141 mmol/L (ref 136–145)
Total Protein: 6.7 g/dL (ref 6.4–8.2)

## 2013-01-18 ENCOUNTER — Ambulatory Visit: Payer: Self-pay | Admitting: Oncology

## 2013-01-25 ENCOUNTER — Ambulatory Visit (INDEPENDENT_AMBULATORY_CARE_PROVIDER_SITE_OTHER): Payer: Medicare Other | Admitting: Internal Medicine

## 2013-01-25 ENCOUNTER — Encounter: Payer: Self-pay | Admitting: Internal Medicine

## 2013-01-25 VITALS — BP 140/78 | HR 76 | Temp 98.3°F | Wt 197.0 lb

## 2013-01-25 DIAGNOSIS — A6920 Lyme disease, unspecified: Secondary | ICD-10-CM | POA: Insufficient documentation

## 2013-01-25 MED ORDER — DOXYCYCLINE HYCLATE 100 MG PO TABS: 100.0000 mg | ORAL_TABLET | Freq: Two times a day (BID) | ORAL | Status: DC

## 2013-01-25 NOTE — Assessment & Plan Note (Signed)
Fairly classic rash Reviewed data---more likely to be STARI Will check ELISA and follow up with western blot prn  Empiric rx with doxy

## 2013-01-25 NOTE — Progress Notes (Signed)
  Subjective:    Patient ID: Cody Barry, male    DOB: 31-Jan-1946, 67 y.o.   MRN: [id number]  HPI Tick bite 10-14 days ago Wife pulled it off---seemed to come off cleanly Not engorged  Was small tick  Had little area of redness Has progressively enlarged Some itching and soreness  No fever No joint pains  No current outpatient prescriptions on file prior to visit.   No current facility-administered medications on file prior to visit.    Allergies  Allergen Reactions  . Penicillins     Past Medical History  Diagnosis Date  . ED (erectile dysfunction)   . BPH (benign prostatic hypertrophy)   . Bladder cancer   . COPD (chronic obstructive pulmonary disease)   . Hyperlipidemia     Past Surgical History  Procedure Laterality Date  . Umbilical hernia repair    . Transurethral resection of bladder tumor  1/14    Dr Evelene Croon    Family History  Problem Relation Age of Onset  . Hypertension Mother   . Coronary artery disease Neg Hx   . Diabetes Neg Hx   . Cancer Neg Hx     History   Social History  . Marital Status: Married    Spouse Name: N/A    Number of Children: 3  . Years of Education: N/A   Occupational History  . farmer- grain    Social History Main Topics  . Smoking status: Former Smoker    Types: Cigarettes    Quit date: 09/20/2006  . Smokeless tobacco: Never Used  . Alcohol Use: Yes     Comment: rare  . Drug Use: No  . Sexually Active: Not on file   Other Topics Concern  . Not on file   Social History Narrative   No living will   Would want wife as health care POA   Would want attempts at resuscitation   Not sure about tube feedings   Review of Systems No headache Appetite is fine No vomiting or diarrhea Has sores in mouth from chemo    Objective:   Physical Exam  Constitutional: He appears well-developed and well-nourished. No distress.  Skin:  Classic bull's eye rash Outer edge 15cm Inner circle 2cm around bite sight           Assessment & Plan:

## 2013-01-26 LAB — B. BURGDORFI ANTIBODIES: B burgdorferi Ab IgG+IgM: 0.19 {ISR}

## 2013-02-18 ENCOUNTER — Ambulatory Visit: Payer: Self-pay | Admitting: Oncology

## 2013-03-20 ENCOUNTER — Ambulatory Visit: Payer: Self-pay | Admitting: Oncology

## 2013-03-26 ENCOUNTER — Encounter: Payer: Self-pay | Admitting: Internal Medicine

## 2013-03-26 ENCOUNTER — Ambulatory Visit (INDEPENDENT_AMBULATORY_CARE_PROVIDER_SITE_OTHER): Payer: Medicare Other | Admitting: Internal Medicine

## 2013-03-26 VITALS — BP 140/80 | HR 83 | Temp 98.5°F | Wt 196.0 lb

## 2013-03-26 DIAGNOSIS — S76311A Strain of muscle, fascia and tendon of the posterior muscle group at thigh level, right thigh, initial encounter: Secondary | ICD-10-CM

## 2013-03-26 NOTE — Assessment & Plan Note (Signed)
Discussed ice after exertion Can try heat at other times Discussed trying to limit activity

## 2013-03-26 NOTE — Patient Instructions (Signed)
Please use the ice after you work and you feel some increased pain. You can try heat at other times. Aleve 1-2 twice a day may be better for the pain than tylenol. Don't use for more than 2 weeks.  Hamstring Strain  Hamstrings are the large muscles in the back of the thighs. A strain or tear injury happens when there is a sudden stretch or pull on these muscles and tendons. Tendons are cord like structures that attach muscle to bone. These injuries are commonly seen in activities such as sprinting due to sudden acceleration.  DIAGNOSIS  Often the diagnosis can be made by examination. HOME CARE INSTRUCTIONS   Apply ice to the sore area for 15-40minutes, 3-4 times per day. Do this while awake for the first 2 days. Put the ice in a plastic bag, and place a towel between the bag of ice and your skin.  Keep your knee flexed when possible. This means your foot is held off the ground slightly if you are on crutches. When lying down, a pillow under the knee will take strain off the muscles and provide some relief.  If a compression bandage such as an ace wrap was applied, use it until you are seen again. You may remove it for sleeping, showers and baths. If the wrap seems to be too tight and is uncomfortable, wrap it more loosely. If your toes or foot are getting cold or blue, it is too tight.  Walk or move around as the pain allows, or as instructed. Resume full activities as suggested by your caregiver. This is often safest when the strength of the injured leg has nearly returned to normal.  Only take over-the-counter or prescription medicines for pain, discomfort, or fever as directed by your caregiver. SEEK MEDICAL CARE IF:   You have an increase in bruising, swelling or pain.  You notice coldness or blueness of your toes or foot.  Pain relief is not obtained with medications.  You have increasing pain in the area and seem to be getting worse rather than better.  You notice your thigh getting  larger in size (this could indicate bleeding into the muscle). Document Released: 06/01/2001 Document Revised: 11/29/2011 Document Reviewed: 09/08/2008 [facility] Patient Information 2014 [place], [place].

## 2013-03-26 NOTE — Progress Notes (Signed)
  Subjective:    Patient ID: Cody Barry, male    DOB: 03-Nov-1945, 67 y.o.   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 Nothing new for him though Started about 10 days ago  Tylenol helps some but then it recurs Continues to do all the same activites  No current outpatient prescriptions on file prior to visit.   No current facility-administered medications on file prior to visit.    Allergies  Allergen Reactions  . Penicillins     Past Medical History  Diagnosis Date  . ED (erectile dysfunction)   . BPH (benign prostatic hypertrophy)   . Bladder cancer   . COPD (chronic obstructive pulmonary disease)   . Hyperlipidemia     Past Surgical History  Procedure Laterality Date  . Umbilical hernia repair    . Transurethral resection of bladder tumor  1/14    Dr Evelene Croon    Family History  Problem Relation Age of Onset  . Hypertension Mother   . Coronary artery disease Neg Hx   . Diabetes Neg Hx   . Cancer Neg Hx     History   Social History  . Marital Status: Married    Spouse Name: N/A    Number of Children: 3  . Years of Education: N/A   Occupational History  . farmer- grain    Social History Main Topics  . Smoking status: Former Smoker    Types: Cigarettes    Quit date: 09/20/2006  . Smokeless tobacco: Never Used  . Alcohol Use: Yes     Comment: rare  . Drug Use: No  . Sexually Active: Not on file   Other Topics Concern  . Not on file   Social History Narrative   No living will   Would want wife as health care POA   Would want attempts at resuscitation   Not sure about tube feedings   Review of Systems No distinct leg or foot swelling No chest pain No SOB    Objective:   Physical Exam  Constitutional: He appears well-developed and well-nourished. No distress.   Musculoskeletal:  Slight tenderness along right hamstring---not to knee  Neurological:  Mildly antalgic gait No weakness in legs---even flexion at the knees          Assessment & Plan:

## 2013-04-03 LAB — CBC CANCER CENTER
Basophil #: 0 x10 3/mm (ref 0.0–0.1)
Eosinophil #: 0.2 x10 3/mm (ref 0.0–0.7)
Eosinophil %: 3.7 %
Lymphocyte #: 1.9 x10 3/mm (ref 1.0–3.6)
Lymphocyte %: 30 %
MCV: 93 fL (ref 80–100)
Monocyte #: 0.8 x10 3/mm (ref 0.2–1.0)
Monocyte %: 13.4 %
Neutrophil #: 3.3 x10 3/mm (ref 1.4–6.5)
Neutrophil %: 52.4 %
RBC: 3.86 10*6/uL — ABNORMAL LOW (ref 4.40–5.90)
RDW: 14.2 % (ref 11.5–14.5)

## 2013-04-03 LAB — COMPREHENSIVE METABOLIC PANEL
Anion Gap: 8 (ref 7–16)
BUN: 28 mg/dL — ABNORMAL HIGH (ref 7–18)
Calcium, Total: 9 mg/dL (ref 8.5–10.1)
Chloride: 105 mmol/L (ref 98–107)
Co2: 28 mmol/L (ref 21–32)
Creatinine: 1.9 mg/dL — ABNORMAL HIGH (ref 0.60–1.30)
EGFR (African American): 41 — ABNORMAL LOW
Osmolality: 286 (ref 275–301)
Potassium: 4.8 mmol/L (ref 3.5–5.1)
SGPT (ALT): 17 U/L (ref 12–78)
Total Protein: 7.6 g/dL (ref 6.4–8.2)

## 2013-04-10 ENCOUNTER — Ambulatory Visit: Payer: Self-pay | Admitting: Oncology

## 2013-04-14 ENCOUNTER — Emergency Department: Payer: Self-pay | Admitting: Emergency Medicine

## 2013-04-17 LAB — COMPREHENSIVE METABOLIC PANEL
Anion Gap: 11 (ref 7–16)
BUN: 46 mg/dL — ABNORMAL HIGH (ref 7–18)
Chloride: 107 mmol/L (ref 98–107)
Co2: 24 mmol/L (ref 21–32)
Creatinine: 2.51 mg/dL — ABNORMAL HIGH (ref 0.60–1.30)
EGFR (African American): 29 — ABNORMAL LOW
Glucose: 85 mg/dL (ref 65–99)
Potassium: 4.6 mmol/L (ref 3.5–5.1)
SGPT (ALT): 13 U/L (ref 12–78)
Sodium: 142 mmol/L (ref 136–145)

## 2013-04-17 LAB — CBC CANCER CENTER
Basophil #: 0 x10 3/mm (ref 0.0–0.1)
Eosinophil #: 0.4 x10 3/mm (ref 0.0–0.7)
Eosinophil %: 5.3 %
HCT: 34.1 % — ABNORMAL LOW (ref 40.0–52.0)
HGB: 11.4 g/dL — ABNORMAL LOW (ref 13.0–18.0)
Lymphocyte #: 1.6 x10 3/mm (ref 1.0–3.6)
Lymphocyte %: 21.6 %
MCH: 30.7 pg (ref 26.0–34.0)
MCHC: 33.6 g/dL (ref 32.0–36.0)
Monocyte #: 0.9 x10 3/mm (ref 0.2–1.0)
Neutrophil %: 60.8 %
Platelet: 269 x10 3/mm (ref 150–440)

## 2013-04-20 ENCOUNTER — Ambulatory Visit: Payer: Self-pay | Admitting: Internal Medicine

## 2013-04-20 ENCOUNTER — Ambulatory Visit: Payer: Self-pay | Admitting: Oncology

## 2013-04-22 ENCOUNTER — Inpatient Hospital Stay: Payer: Self-pay | Admitting: Internal Medicine

## 2013-04-22 LAB — CBC
HCT: 32.8 % — ABNORMAL LOW (ref 40.0–52.0)
HGB: 11.1 g/dL — ABNORMAL LOW (ref 13.0–18.0)
MCH: 30 pg (ref 26.0–34.0)
MCHC: 33.8 g/dL (ref 32.0–36.0)
MCV: 89 fL (ref 80–100)
Platelet: 196 10*3/uL (ref 150–440)
RDW: 14.8 % — ABNORMAL HIGH (ref 11.5–14.5)
WBC: 6.1 10*3/uL (ref 3.8–10.6)

## 2013-04-22 LAB — COMPREHENSIVE METABOLIC PANEL
Albumin: 2.6 g/dL — ABNORMAL LOW (ref 3.4–5.0)
Anion Gap: 7 (ref 7–16)
BUN: 50 mg/dL — ABNORMAL HIGH (ref 7–18)
Bilirubin,Total: 0.5 mg/dL (ref 0.2–1.0)
Chloride: 110 mmol/L — ABNORMAL HIGH (ref 98–107)
Co2: 23 mmol/L (ref 21–32)
Creatinine: 2.06 mg/dL — ABNORMAL HIGH (ref 0.60–1.30)
EGFR (African American): 37 — ABNORMAL LOW
EGFR (Non-African Amer.): 32 — ABNORMAL LOW
Glucose: 106 mg/dL — ABNORMAL HIGH (ref 65–99)
Potassium: 4.8 mmol/L (ref 3.5–5.1)
SGOT(AST): 46 U/L — ABNORMAL HIGH (ref 15–37)
SGPT (ALT): 103 U/L — ABNORMAL HIGH (ref 12–78)

## 2013-04-22 LAB — URINALYSIS, COMPLETE
Bacteria: NONE SEEN
Bilirubin,UR: NEGATIVE
Glucose,UR: NEGATIVE mg/dL (ref 0–75)
Ketone: NEGATIVE
Protein: 100
RBC,UR: 56 /HPF (ref 0–5)
Specific Gravity: 1.024 (ref 1.003–1.030)
WBC UR: 22 /HPF (ref 0–5)

## 2013-04-22 LAB — CK TOTAL AND CKMB (NOT AT ARMC): CK, Total: 31 U/L — ABNORMAL LOW (ref 35–232)

## 2013-04-22 LAB — PROTIME-INR: INR: 1

## 2013-04-22 LAB — AMMONIA: Ammonia, Plasma: <25 mcmol/L (ref 11–32)

## 2013-04-22 LAB — LIPASE, BLOOD: Lipase: 66 U/L — ABNORMAL LOW (ref 73–393)

## 2013-04-23 DIAGNOSIS — I4891 Unspecified atrial fibrillation: Secondary | ICD-10-CM

## 2013-04-23 DIAGNOSIS — I10 Essential (primary) hypertension: Secondary | ICD-10-CM

## 2013-04-23 LAB — TROPONIN I
Troponin-I: <0.02 ng/mL
Troponin-I: <0.02 ng/mL
Troponin-I: <0.02 ng/mL

## 2013-04-23 LAB — CK-MB
CK-MB: 0.6 ng/mL (ref 0.5–3.6)
CK-MB: 0.7 ng/mL (ref 0.5–3.6)

## 2013-04-23 LAB — TSH: Thyroid Stimulating Horm: 0.651 u[IU]/mL

## 2013-04-23 LAB — AMMONIA: Ammonia, Plasma: <25 mcmol/L (ref 11–32)

## 2013-04-24 LAB — URINE CULTURE

## 2013-04-25 LAB — CBC WITH DIFFERENTIAL/PLATELET
Basophil #: 0 10*3/uL (ref 0.0–0.1)
Basophil %: 0.3 %
Eosinophil #: 0.3 10*3/uL (ref 0.0–0.7)
Eosinophil %: 7.1 %
HGB: 9.8 g/dL — ABNORMAL LOW (ref 13.0–18.0)
MCH: 30.5 pg (ref 26.0–34.0)
MCHC: 34 g/dL (ref 32.0–36.0)
MCV: 90 fL (ref 80–100)
Monocyte #: 0.6 x10 3/mm (ref 0.2–1.0)
Neutrophil #: 2.4 10*3/uL (ref 1.4–6.5)
Platelet: 136 10*3/uL — ABNORMAL LOW (ref 150–440)
RBC: 3.2 10*6/uL — ABNORMAL LOW (ref 4.40–5.90)
RDW: 14.8 % — ABNORMAL HIGH (ref 11.5–14.5)

## 2013-04-25 LAB — BASIC METABOLIC PANEL
Anion Gap: 9 (ref 7–16)
BUN: 41 mg/dL — ABNORMAL HIGH (ref 7–18)
Chloride: 109 mmol/L — ABNORMAL HIGH (ref 98–107)
Creatinine: 1.77 mg/dL — ABNORMAL HIGH (ref 0.60–1.30)
EGFR (African American): 44 — ABNORMAL LOW
Osmolality: 285 (ref 275–301)
Potassium: 4.5 mmol/L (ref 3.5–5.1)

## 2013-05-04 ENCOUNTER — Ambulatory Visit (INDEPENDENT_AMBULATORY_CARE_PROVIDER_SITE_OTHER): Payer: Medicare Other | Admitting: Cardiovascular Disease

## 2013-05-04 ENCOUNTER — Encounter: Payer: Self-pay | Admitting: Cardiovascular Disease

## 2013-05-04 VITALS — BP 130/72 | Ht 70.0 in | Wt 191.5 lb

## 2013-05-04 DIAGNOSIS — I4891 Unspecified atrial fibrillation: Secondary | ICD-10-CM | POA: Insufficient documentation

## 2013-05-04 DIAGNOSIS — R0602 Shortness of breath: Secondary | ICD-10-CM

## 2013-05-04 DIAGNOSIS — E785 Hyperlipidemia, unspecified: Secondary | ICD-10-CM

## 2013-05-04 NOTE — Progress Notes (Signed)
Patient ID: Cody Barry, male    DOB: Mar 15, 1946, 67 y.o.   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  It was felt that he was not a good Coumadin candidate. He was given metoprolol, he converted back to normal sinus rhythm. He did have additional episodes of atrial fibrillation and was given amiodarone load with metoprolol in the hospital and she is maintaining normal sinus rhythm.  In followup today, he feels well apart from right hip pain. He denies any tachycardia or palpitations concerning for recurrent atrial fibrillation. He is taking amiodarone 200 mg twice a day with metoprolol 25 mg twice a day  He is scheduled to restart chemotherapy next week Lab work in the hospital, creatinine 1.77, BUN 41, TSH 0.65  Echocardiogram dated 04/23/2013 normal LV systolic function ejection fraction 50-55%,   EKG shows normal sinus rhythm with rate 52 beats per minute, no significant ST or T wave changes     Outpatient Encounter Prescriptions as of 05/04/2013  Medication Sig Dispense Refill  . amiodarone (PACERONE) 200 MG tablet Take 200 mg by mouth 2 (two) times daily.       Marland Kitchen aspirin 81 MG tablet Take 81 mg by mouth daily.      . cyclobenzaprine (FLEXERIL) 10 MG tablet Take 10 mg by mouth 3 (three) times daily as needed for muscle spasms.      Marland Kitchen HYDROcodone-acetaminophen (NORCO/VICODIN) 5-325 MG per tablet Take 1 tablet by mouth every 4 (four) hours as needed for pain.      . metoprolol tartrate (LOPRESSOR) 25 MG tablet Take 25 mg by mouth 2 (two) times daily.       . predniSONE (DELTASONE) 10 MG tablet Take 10 mg by mouth daily.       Marland Kitchen Risperidone 0.25 MG  TBDP Take 0.25 mg by mouth.       . traZODone (DESYREL) 50 MG tablet Take 25 mg by mouth at bedtime.        Review of Systems  HENT: Negative.   Eyes: Negative.   Respiratory: Negative.   Cardiovascular: Negative.   Gastrointestinal: Negative.   Musculoskeletal: Positive for arthralgias.       Right hip pain  Skin: Negative.   Neurological: Negative.   Psychiatric/Behavioral: Negative.   All other systems reviewed and are negative.    BP 130/72  Ht 5\' 10"  (1.778 m)  Wt 191 lb 8 oz (86.864 kg)  BMI 27.48 kg/m2  Physical Exam  Nursing note and vitals reviewed. Constitutional: He is oriented to person, place, and time. He appears well-developed and well-nourished.  HENT:  Head: Normocephalic.  Nose: Nose normal.  Mouth/Throat: Oropharynx is clear and moist.  Eyes: Conjunctivae are normal. Pupils are equal, round, and reactive to light.  Neck: Normal range of motion. Neck supple. No JVD present.  Cardiovascular: Normal rate, regular rhythm, S1 normal, S2 normal, normal heart sounds and intact distal pulses.  Exam reveals no gallop and no friction rub.   No murmur heard. Pulmonary/Chest: Effort normal and breath sounds normal. No respiratory distress. He has no wheezes. He has no rales. He exhibits no tenderness.  Abdominal: Soft. Bowel sounds are normal. He exhibits no distension. There is no  tenderness.  Musculoskeletal: Normal range of motion. He exhibits no edema and no tenderness.  Lymphadenopathy:    He has no cervical adenopathy.  Neurological: He is alert and oriented to person, place, and time. Coordination normal.  Skin: Skin is warm and dry. No rash noted. No erythema.  Psychiatric: He has a normal mood and affect. His behavior is normal. Judgment and thought content normal.      Assessment and Plan

## 2013-05-04 NOTE — Assessment & Plan Note (Signed)
No treatment needed at this time. Mild weight loss. Suspect he'll abnormal weight loss with pending treatments.

## 2013-05-04 NOTE — Patient Instructions (Addendum)
You are doing well.  You are in normal rhythm today Heart rate is slow , 52 bpm Please decrease the amiodarone pill back to one a day (down from twice a day)  Continue metoprolol twice a day  Please monitor your heart rate when you have treatments Call the office for fast heart rates greater than 100  Please call us if you have new issues that need to be addressed before your next appt.  Your physician wants you to follow-up in: 3 months.  You will receive a reminder letter in the mail two months in advance. If you don't receive a letter, please call our office to schedule the follow-up appointment.

## 2013-05-04 NOTE — Assessment & Plan Note (Addendum)
Appears to be maintaining normal sinus rhythm. He is somewhat bradycardic today. We will decrease his amiodarone down to 200 mg daily. Continue metoprolol 25 mg twice a day. Will hold off on anticoagulation given the need for treatments/chemotherapy, radiation. If he maintains normal sinus rhythm, would be acceptable risk of not using anticoagulation.   His anticoagulation needed for DVT prophylaxis, could use renal dose eliquis 2.5 mg twice a day.

## 2013-05-08 ENCOUNTER — Ambulatory Visit: Payer: Medicare Other | Admitting: Internal Medicine

## 2013-05-08 LAB — BASIC METABOLIC PANEL
Anion Gap: 6 — ABNORMAL LOW (ref 7–16)
BUN: 37 mg/dL — ABNORMAL HIGH (ref 7–18)
Calcium, Total: 8.9 mg/dL (ref 8.5–10.1)
Chloride: 104 mmol/L (ref 98–107)
Co2: 27 mmol/L (ref 21–32)
Creatinine: 1.88 mg/dL — ABNORMAL HIGH (ref 0.60–1.30)
EGFR (African American): 41 — ABNORMAL LOW
EGFR (Non-African Amer.): 36 — ABNORMAL LOW
Glucose: 119 mg/dL — ABNORMAL HIGH (ref 65–99)
Sodium: 137 mmol/L (ref 136–145)

## 2013-05-08 LAB — CBC CANCER CENTER
Basophil %: 0.9 %
Eosinophil #: 0.1 x10 3/mm (ref 0.0–0.7)
Lymphocyte #: 2 x10 3/mm (ref 1.0–3.6)
MCH: 29.9 pg (ref 26.0–34.0)
MCHC: 32.8 g/dL (ref 32.0–36.0)
MCV: 91 fL (ref 80–100)
Monocyte #: 1.1 x10 3/mm — ABNORMAL HIGH (ref 0.2–1.0)
Platelet: 348 x10 3/mm (ref 150–440)

## 2013-05-15 LAB — CBC CANCER CENTER
Basophil #: 0 x10 3/mm (ref 0.0–0.1)
Basophil %: 0.3 %
Eosinophil %: 0.7 %
HCT: 34.1 % — ABNORMAL LOW (ref 40.0–52.0)
Lymphocyte #: 0.7 x10 3/mm — ABNORMAL LOW (ref 1.0–3.6)
Lymphocyte %: 7.7 %
MCHC: 33.6 g/dL (ref 32.0–36.0)
Monocyte %: 7.8 %
Neutrophil %: 83.5 %
Platelet: 243 x10 3/mm (ref 150–440)
RBC: 3.79 10*6/uL — ABNORMAL LOW (ref 4.40–5.90)
WBC: 8.8 x10 3/mm (ref 3.8–10.6)

## 2013-05-21 ENCOUNTER — Ambulatory Visit: Payer: Self-pay | Admitting: Internal Medicine

## 2013-05-21 ENCOUNTER — Ambulatory Visit: Payer: Self-pay | Admitting: Oncology

## 2013-05-22 LAB — CBC CANCER CENTER
Basophil #: 0.1 x10 3/mm (ref 0.0–0.1)
Basophil %: 0.9 %
Eosinophil #: 0.4 x10 3/mm (ref 0.0–0.7)
Eosinophil %: 4.8 %
HGB: 10.7 g/dL — ABNORMAL LOW (ref 13.0–18.0)
Lymphocyte #: 1.2 x10 3/mm (ref 1.0–3.6)
MCH: 29.9 pg (ref 26.0–34.0)
Monocyte #: 0.9 x10 3/mm (ref 0.2–1.0)
Monocyte %: 12.1 %
Neutrophil #: 5 x10 3/mm (ref 1.4–6.5)
Neutrophil %: 66.3 %
RBC: 3.57 10*6/uL — ABNORMAL LOW (ref 4.40–5.90)
RDW: 16.2 % — ABNORMAL HIGH (ref 11.5–14.5)

## 2013-05-22 LAB — COMPREHENSIVE METABOLIC PANEL
Albumin: 2.9 g/dL — ABNORMAL LOW (ref 3.4–5.0)
Alkaline Phosphatase: 56 U/L (ref 50–136)
Anion Gap: 4 — ABNORMAL LOW (ref 7–16)
BUN: 33 mg/dL — ABNORMAL HIGH (ref 7–18)
Calcium, Total: 8.8 mg/dL (ref 8.5–10.1)
Chloride: 107 mmol/L (ref 98–107)
Co2: 26 mmol/L (ref 21–32)
Creatinine: 1.75 mg/dL — ABNORMAL HIGH (ref 0.60–1.30)
EGFR (African American): 45 — ABNORMAL LOW
EGFR (Non-African Amer.): 39 — ABNORMAL LOW
Glucose: 78 mg/dL (ref 65–99)
Osmolality: 280 (ref 275–301)
Potassium: 4.4 mmol/L (ref 3.5–5.1)
SGOT(AST): 15 U/L (ref 15–37)
Total Protein: 6.8 g/dL (ref 6.4–8.2)

## 2013-05-23 ENCOUNTER — Ambulatory Visit (INDEPENDENT_AMBULATORY_CARE_PROVIDER_SITE_OTHER): Payer: Medicare Other | Admitting: Internal Medicine

## 2013-05-23 ENCOUNTER — Encounter: Payer: Self-pay | Admitting: Internal Medicine

## 2013-05-23 VITALS — BP 116/60 | HR 70 | Temp 97.6°F | Wt 193.2 lb

## 2013-05-23 DIAGNOSIS — Z23 Encounter for immunization: Secondary | ICD-10-CM

## 2013-05-23 DIAGNOSIS — C679 Malignant neoplasm of bladder, unspecified: Secondary | ICD-10-CM

## 2013-05-23 DIAGNOSIS — J449 Chronic obstructive pulmonary disease, unspecified: Secondary | ICD-10-CM

## 2013-05-23 DIAGNOSIS — F05 Delirium due to known physiological condition: Secondary | ICD-10-CM

## 2013-05-23 DIAGNOSIS — I4891 Unspecified atrial fibrillation: Secondary | ICD-10-CM

## 2013-05-23 NOTE — Assessment & Plan Note (Signed)
Resolved now Will stop the risperidone

## 2013-05-23 NOTE — Progress Notes (Signed)
Subjective:    Patient ID: Cody Barry, male    DOB: [birth year], 67 y.o.   MRN: [id number]  HPI [facility] follow up  Had pathologic fracture in right pelvis Went home after a few days--- got home PT Now getting around okay with cane Driving, does all personal care Still using pain pill--hydrocodone 4 times per day usually Did get 10 RT treatments with Dr Rushie Chestnut.  Chemo with Dr Stephannie Li into atrial fib also No symptoms when it occured  Apparently had some delerium in hospital Put on risperdal  Breathing is okay No regular cough  Current Outpatient Prescriptions on File Prior to Visit  Medication Sig Dispense Refill  . amiodarone (PACERONE) 200 MG tablet Take 200 mg by mouth daily.       Marland Kitchen aspirin 81 MG tablet Take 81 mg by mouth daily.      . cyclobenzaprine (FLEXERIL) 10 MG tablet Take 10 mg by mouth 3 (three) times daily as needed for muscle spasms.      Marland Kitchen HYDROcodone-acetaminophen (NORCO/VICODIN) 5-325 MG per tablet Take 1 tablet by mouth every 4 (four) hours as needed for pain.      . metoprolol tartrate (LOPRESSOR) 25 MG tablet Take 25 mg by mouth 2 (two) times daily.       . traZODone (DESYREL) 50 MG tablet Take 25 mg by mouth at bedtime.       No current facility-administered medications on file prior to visit.    Allergies  Allergen Reactions  . Penicillins     Past Medical History  Diagnosis Date  . ED (erectile dysfunction)   . BPH (benign prostatic hypertrophy)   . COPD (chronic obstructive pulmonary disease)   . Hyperlipidemia   . Bladder cancer 2014    Past Surgical History  Procedure Laterality Date  . Umbilical hernia repair    . Transurethral resection of bladder tumor  1/14    Dr Evelene Croon    Family History  Problem Relation Age of Onset  . Hypertension Mother   . Coronary artery disease Neg Hx   . Diabetes Neg Hx   . Cancer Neg Hx     History   Social History  . Marital Status: Married    Spouse Name: N/A    Number of  Children: 3  . Years of Education: N/A   Occupational History  . farmer- grain    Social History Main Topics  . Smoking status: Former Smoker -- 1.00 packs/day for 45 years    Types: Cigarettes    Quit date: 09/20/2006  . Smokeless tobacco: Never Used  . Alcohol Use: Yes     Comment: rare  . Drug Use: No  . Sexual Activity: Not on file   Other Topics Concern  . Not on file   Social History Narrative   No living will   Would want wife as health care POA   Would want attempts at resuscitation   Not sure about tube feedings   Review of Systems Still has nighttime awakening---but does get back to sleep Appetite is fine Weight fairly stable     Objective:   Physical Exam  Constitutional: He is oriented to person, place, and time. He appears well-developed and well-nourished. No distress.  Neck: Normal range of motion. Neck supple. No thyromegaly present.  Cardiovascular: Normal rate, regular rhythm and normal heart sounds.  Exam reveals no gallop.   No murmur heard. Pulmonary/Chest: Effort normal. No respiratory distress. He has no wheezes. He  has no rales.  Decreased breath sounds but clear  Lymphadenopathy:    He has no cervical adenopathy.  Neurological: He is alert and oriented to person, place, and time.  Psychiatric: He has a normal mood and affect. His behavior is normal.          Assessment & Plan:

## 2013-05-23 NOTE — Addendum Note (Signed)
Addended by: Criselda Peaches B on: 05/23/2013 09:59 AM   Modules accepted: Orders

## 2013-05-23 NOTE — Assessment & Plan Note (Signed)
No symptoms of note now

## 2013-05-23 NOTE — Patient Instructions (Signed)
Please stop the risperidone

## 2013-05-23 NOTE — Assessment & Plan Note (Signed)
Remains in sinus rhythm with amiodarone and metoprolol No apparent side effects with these Rate is good now

## 2013-05-23 NOTE — Assessment & Plan Note (Signed)
With recent pathologic pelvic fracture Has had RT Continuing chemo with Dr Orlie Dakin Still needs the hydrocodone

## 2013-05-29 LAB — COMPREHENSIVE METABOLIC PANEL
Albumin: 2.9 g/dL — ABNORMAL LOW (ref 3.4–5.0)
Anion Gap: 7 (ref 7–16)
Bilirubin,Total: 0.3 mg/dL (ref 0.2–1.0)
Calcium, Total: 9.1 mg/dL (ref 8.5–10.1)
Co2: 26 mmol/L (ref 21–32)
EGFR (African American): 37 — ABNORMAL LOW
Glucose: 97 mg/dL (ref 65–99)
SGOT(AST): 19 U/L (ref 15–37)
SGPT (ALT): 28 U/L (ref 12–78)
Sodium: 138 mmol/L (ref 136–145)
Total Protein: 7.1 g/dL (ref 6.4–8.2)

## 2013-05-29 LAB — CBC CANCER CENTER
Basophil %: 0.2 %
Eosinophil %: 5 %
HCT: 31.8 % — ABNORMAL LOW (ref 40.0–52.0)
Lymphocyte #: 0.6 x10 3/mm — ABNORMAL LOW (ref 1.0–3.6)
Lymphocyte %: 15.3 %
MCH: 29.5 pg (ref 26.0–34.0)
MCHC: 32.7 g/dL (ref 32.0–36.0)
MCV: 90 fL (ref 80–100)
Monocyte #: 0.2 x10 3/mm (ref 0.2–1.0)
Neutrophil #: 2.8 x10 3/mm (ref 1.4–6.5)
Neutrophil %: 73.8 %
RBC: 3.52 10*6/uL — ABNORMAL LOW (ref 4.40–5.90)

## 2013-06-01 ENCOUNTER — Encounter: Payer: Self-pay | Admitting: Internal Medicine

## 2013-06-05 ENCOUNTER — Telehealth: Payer: Self-pay | Admitting: *Deleted

## 2013-06-05 LAB — CBC CANCER CENTER
Basophil #: 0 x10 3/mm (ref 0.0–0.1)
Basophil %: 0.1 %
Eosinophil #: 0 x10 3/mm (ref 0.0–0.7)
Eosinophil %: 0.4 %
HGB: 10.2 g/dL — ABNORMAL LOW (ref 13.0–18.0)
Lymphocyte %: 15.9 %
MCH: 29.5 pg (ref 26.0–34.0)
Monocyte #: 0.6 x10 3/mm (ref 0.2–1.0)
Monocyte %: 10.9 %
Neutrophil %: 72.7 %
Platelet: 82 x10 3/mm — ABNORMAL LOW (ref 150–440)
RBC: 3.45 10*6/uL — ABNORMAL LOW (ref 4.40–5.90)
WBC: 5.4 x10 3/mm (ref 3.8–10.6)

## 2013-06-05 LAB — BASIC METABOLIC PANEL
Anion Gap: 10 (ref 7–16)
Calcium, Total: 8.7 mg/dL (ref 8.5–10.1)
Co2: 25 mmol/L (ref 21–32)
Creatinine: 1.94 mg/dL — ABNORMAL HIGH (ref 0.60–1.30)
EGFR (Non-African Amer.): 34 — ABNORMAL LOW
Osmolality: 283 (ref 275–301)
Potassium: 4.6 mmol/L (ref 3.5–5.1)
Sodium: 138 mmol/L (ref 136–145)

## 2013-06-05 NOTE — Telephone Encounter (Signed)
Please call patient has a question regarding Amidodarone HCL 200 mg and Flucconazole

## 2013-06-05 NOTE — Telephone Encounter (Signed)
Pt has questions about medications. Please advise.
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
## 2013-06-06 NOTE — Telephone Encounter (Signed)
Pt wife is calling back with name of medication it is Fluconazole. Please call back.

## 2013-06-06 NOTE — Telephone Encounter (Signed)
Pt called wanted to make sure that fluconazole would not interract with any of his medications and is okay to take.

## 2013-06-06 NOTE — Telephone Encounter (Signed)
With such a low amiodarone dose, he should be fine to take fluconazole

## 2013-06-06 NOTE — Telephone Encounter (Signed)
Spoke w/ pt.  He verbalizes understanding.  

## 2013-06-12 LAB — CBC CANCER CENTER
Basophil #: 0 x10 3/mm (ref 0.0–0.1)
Basophil %: 0.3 %
Eosinophil #: 0.1 x10 3/mm (ref 0.0–0.7)
Eosinophil %: 1.2 %
HCT: 31.9 % — ABNORMAL LOW (ref 40.0–52.0)
Lymphocyte #: 1.1 x10 3/mm (ref 1.0–3.6)
Lymphocyte %: 15.8 %
MCH: 30 pg (ref 26.0–34.0)
MCHC: 33 g/dL (ref 32.0–36.0)
Monocyte %: 18.3 %
Neutrophil #: 4.5 x10 3/mm (ref 1.4–6.5)
Neutrophil %: 64.4 %
WBC: 7 x10 3/mm (ref 3.8–10.6)

## 2013-06-12 LAB — COMPREHENSIVE METABOLIC PANEL
Albumin: 3.1 g/dL — ABNORMAL LOW (ref 3.4–5.0)
Alkaline Phosphatase: 68 U/L (ref 50–136)
Anion Gap: 8 (ref 7–16)
Bilirubin,Total: <0.1 mg/dL — ABNORMAL LOW (ref 0.2–1.0)
Chloride: 104 mmol/L (ref 98–107)
Creatinine: 2.07 mg/dL — ABNORMAL HIGH (ref 0.60–1.30)
EGFR (African American): 37 — ABNORMAL LOW
EGFR (Non-African Amer.): 32 — ABNORMAL LOW
Glucose: 88 mg/dL (ref 65–99)
Osmolality: 287 (ref 275–301)
SGOT(AST): 18 U/L (ref 15–37)
Sodium: 139 mmol/L (ref 136–145)

## 2013-06-18 ENCOUNTER — Other Ambulatory Visit: Payer: Self-pay

## 2013-06-18 MED ORDER — AMIODARONE HCL 200 MG PO TABS: 200.0000 mg | ORAL_TABLET | Freq: Every day | ORAL | Status: DC

## 2013-06-18 NOTE — Telephone Encounter (Signed)
Refilled Amiodarone sent to [facility] pharmacy.

## 2013-06-19 LAB — BASIC METABOLIC PANEL
Anion Gap: 11 (ref 7–16)
BUN: 34 mg/dL — ABNORMAL HIGH (ref 7–18)
Calcium, Total: 8.9 mg/dL (ref 8.5–10.1)
Chloride: 104 mmol/L (ref 98–107)
Co2: 25 mmol/L (ref 21–32)
EGFR (African American): 41 — ABNORMAL LOW
EGFR (Non-African Amer.): 35 — ABNORMAL LOW
Glucose: 100 mg/dL — ABNORMAL HIGH (ref 65–99)
Osmolality: 287 (ref 275–301)
Potassium: 4.9 mmol/L (ref 3.5–5.1)
Sodium: 140 mmol/L (ref 136–145)

## 2013-06-19 LAB — CBC CANCER CENTER
Basophil #: 0 x10 3/mm (ref 0.0–0.1)
Eosinophil %: 0.6 %
HCT: 31.6 % — ABNORMAL LOW (ref 40.0–52.0)
Lymphocyte #: 1.1 x10 3/mm (ref 1.0–3.6)
MCH: 30.1 pg (ref 26.0–34.0)
MCHC: 32.9 g/dL (ref 32.0–36.0)
Neutrophil #: 3.5 x10 3/mm (ref 1.4–6.5)
Neutrophil %: 63.9 %
Platelet: 347 x10 3/mm (ref 150–440)
RBC: 3.46 10*6/uL — ABNORMAL LOW (ref 4.40–5.90)
WBC: 5.5 x10 3/mm (ref 3.8–10.6)

## 2013-06-20 ENCOUNTER — Ambulatory Visit: Payer: Self-pay | Admitting: Internal Medicine

## 2013-06-20 ENCOUNTER — Ambulatory Visit: Payer: Self-pay | Admitting: Oncology

## 2013-06-26 LAB — CBC CANCER CENTER
Basophil #: 0 x10 3/mm (ref 0.0–0.1)
Eosinophil #: 0.1 x10 3/mm (ref 0.0–0.7)
HCT: 28.5 % — ABNORMAL LOW (ref 40.0–52.0)
HGB: 9.5 g/dL — ABNORMAL LOW (ref 13.0–18.0)
MCH: 30.7 pg (ref 26.0–34.0)
MCV: 93 fL (ref 80–100)
Monocyte #: 0.1 x10 3/mm — ABNORMAL LOW (ref 0.2–1.0)
Neutrophil #: 1.6 x10 3/mm (ref 1.4–6.5)
Neutrophil %: 66 %
Platelet: 86 x10 3/mm — ABNORMAL LOW (ref 150–440)
RBC: 3.08 10*6/uL — ABNORMAL LOW (ref 4.40–5.90)

## 2013-06-26 LAB — COMPREHENSIVE METABOLIC PANEL
Anion Gap: 8 (ref 7–16)
BUN: 38 mg/dL — ABNORMAL HIGH (ref 7–18)
Calcium, Total: 8.4 mg/dL — ABNORMAL LOW (ref 8.5–10.1)
Chloride: 105 mmol/L (ref 98–107)
EGFR (Non-African Amer.): 37 — ABNORMAL LOW
Glucose: 94 mg/dL (ref 65–99)
Osmolality: 286 (ref 275–301)
Potassium: 4.9 mmol/L (ref 3.5–5.1)
SGOT(AST): 21 U/L (ref 15–37)
Total Protein: 6.8 g/dL (ref 6.4–8.2)

## 2013-07-03 LAB — CBC CANCER CENTER
Bands: 4 %
Eosinophil: 2 %
HCT: 26.7 % — ABNORMAL LOW (ref 40.0–52.0)
HGB: 9 g/dL — ABNORMAL LOW (ref 13.0–18.0)
Lymphocytes: 11 %
RDW: 22 % — ABNORMAL HIGH (ref 11.5–14.5)
Segmented Neutrophils: 58 %
WBC: 8.3 x10 3/mm (ref 3.8–10.6)

## 2013-07-03 LAB — COMPREHENSIVE METABOLIC PANEL
Alkaline Phosphatase: 59 U/L (ref 50–136)
Anion Gap: 5 — ABNORMAL LOW (ref 7–16)
Bilirubin,Total: 0.3 mg/dL (ref 0.2–1.0)
Chloride: 108 mmol/L — ABNORMAL HIGH (ref 98–107)
Co2: 26 mmol/L (ref 21–32)
Creatinine: 1.82 mg/dL — ABNORMAL HIGH (ref 0.60–1.30)
EGFR (African American): 43 — ABNORMAL LOW
EGFR (Non-African Amer.): 37 — ABNORMAL LOW
Glucose: 80 mg/dL (ref 65–99)
Osmolality: 282 (ref 275–301)
SGOT(AST): 23 U/L (ref 15–37)
Sodium: 139 mmol/L (ref 136–145)
Total Protein: 6.5 g/dL (ref 6.4–8.2)

## 2013-07-10 LAB — IRON AND TIBC
Iron Bind.Cap.(Total): 266 ug/dL (ref 250–450)
Iron: 63 ug/dL — ABNORMAL LOW (ref 65–175)
Unbound Iron-Bind.Cap.: 203 ug/dL

## 2013-07-10 LAB — CBC CANCER CENTER
Basophil: 1 %
Eosinophil: 1 %
HGB: 8.9 g/dL — ABNORMAL LOW (ref 13.0–18.0)
Lymphocytes: 9 %
MCH: 30.7 pg (ref 26.0–34.0)
MCHC: 32.7 g/dL (ref 32.0–36.0)
Metamyelocyte: 8 %
Monocytes: 9 %
NRBC/100 WBC: 5 /100
Platelet: 128 x10 3/mm — ABNORMAL LOW (ref 150–440)
RBC: 2.91 10*6/uL — ABNORMAL LOW (ref 4.40–5.90)
Segmented Neutrophils: 31 %

## 2013-07-10 LAB — FERRITIN: Ferritin (ARMC): 1765 ng/mL — ABNORMAL HIGH (ref 8–388)

## 2013-07-13 ENCOUNTER — Telehealth: Payer: Self-pay

## 2013-07-13 NOTE — Telephone Encounter (Signed)
Spoke with pt's wife who is asking if pt can stop or decrease blood pressure medications due to weakness. Wife reports pt has increased leg and overall weakness in last 2-3 weeks. She reports blood pressure was low when checked last Tuesday. She does not know what blood pressure was. Has not been checked since. She reports heart rate is normal but does not know rate. No chest pain. No increased shortness of breath.  I told pt's wife pt should not stop medications without being evaluated and not knowing recent blood pressure readings.  I recommended pt contact primary care MD to schedule appt to have weakness evaluated.  I told pt I would make Dr. Mariah Milling aware.

## 2013-07-13 NOTE — Telephone Encounter (Signed)
Pt wife called and asks if pt can stop him BP medication. States pt is very weak, and "every time he goes to the dr, his BP is low". She does not know what his BP was last time he went to the dr. Please call.

## 2013-07-13 NOTE — Telephone Encounter (Signed)
Spoke with pt's wife and gave her instructions from Dr. Mariah Milling. She will call early next week with readings.

## 2013-07-13 NOTE — Telephone Encounter (Signed)
Would buy blood pressure cuff and monitor over the weekend, Call back on Monday with numbers. Heart rate and  Blood pressure

## 2013-07-17 LAB — COMPREHENSIVE METABOLIC PANEL
Alkaline Phosphatase: 85 U/L (ref 50–136)
Anion Gap: 10 (ref 7–16)
Bilirubin,Total: 0.4 mg/dL (ref 0.2–1.0)
Co2: 26 mmol/L (ref 21–32)
Glucose: 86 mg/dL (ref 65–99)
Potassium: 4.5 mmol/L (ref 3.5–5.1)
SGOT(AST): 17 U/L (ref 15–37)
SGPT (ALT): 23 U/L (ref 12–78)
Sodium: 143 mmol/L (ref 136–145)
Total Protein: 6.5 g/dL (ref 6.4–8.2)

## 2013-07-17 LAB — CBC CANCER CENTER
Basophil #: 0.1 x10 3/mm (ref 0.0–0.1)
Basophil %: 0.6 %
Eosinophil #: 0.2 x10 3/mm (ref 0.0–0.7)
Eosinophil %: 1 %
Lymphocyte %: 7.7 %
MCV: 98 fL (ref 80–100)
Monocyte #: 1.9 x10 3/mm — ABNORMAL HIGH (ref 0.2–1.0)
Monocyte %: 11.6 %
Neutrophil %: 79.1 %
Platelet: 173 x10 3/mm (ref 150–440)
RBC: 2.94 10*6/uL — ABNORMAL LOW (ref 4.40–5.90)

## 2013-07-21 ENCOUNTER — Ambulatory Visit: Payer: Self-pay | Admitting: Oncology

## 2013-07-21 ENCOUNTER — Ambulatory Visit: Payer: Self-pay | Admitting: Internal Medicine

## 2013-07-31 LAB — CBC CANCER CENTER
Basophil #: 0.1 x10 3/mm (ref 0.0–0.1)
Eosinophil %: 0 %
HCT: 29.7 % — ABNORMAL LOW (ref 40.0–52.0)
HGB: 9.3 g/dL — ABNORMAL LOW (ref 13.0–18.0)
Lymphocyte #: 1 x10 3/mm (ref 1.0–3.6)
MCH: 31.9 pg (ref 26.0–34.0)
MCV: 102 fL — ABNORMAL HIGH (ref 80–100)
Monocyte #: 1.9 x10 3/mm — ABNORMAL HIGH (ref 0.2–1.0)
RDW: 25 % — ABNORMAL HIGH (ref 11.5–14.5)

## 2013-07-31 LAB — COMPREHENSIVE METABOLIC PANEL
Albumin: 3.1 g/dL — ABNORMAL LOW (ref 3.4–5.0)
Alkaline Phosphatase: 103 U/L (ref 50–136)
Anion Gap: 7 (ref 7–16)
Bilirubin,Total: 0.3 mg/dL (ref 0.2–1.0)
Co2: 26 mmol/L (ref 21–32)
Creatinine: 1.51 mg/dL — ABNORMAL HIGH (ref 0.60–1.30)
EGFR (African American): 54 — ABNORMAL LOW
Glucose: 89 mg/dL (ref 65–99)
Osmolality: 286 (ref 275–301)

## 2013-08-08 ENCOUNTER — Ambulatory Visit: Payer: Self-pay | Admitting: Oncology

## 2013-08-14 LAB — CBC CANCER CENTER
Eosinophil %: 0.7 %
Lymphocyte #: 0.8 x10 3/mm — ABNORMAL LOW (ref 1.0–3.6)
MCHC: 31.8 g/dL — ABNORMAL LOW (ref 32.0–36.0)
Monocyte #: 1 x10 3/mm (ref 0.2–1.0)
Monocyte %: 10.2 %
Neutrophil #: 7.5 x10 3/mm — ABNORMAL HIGH (ref 1.4–6.5)
Neutrophil %: 80.2 %
RBC: 3.14 10*6/uL — ABNORMAL LOW (ref 4.40–5.90)
RDW: 20.5 % — ABNORMAL HIGH (ref 11.5–14.5)
WBC: 9.4 x10 3/mm (ref 3.8–10.6)

## 2013-08-14 LAB — COMPREHENSIVE METABOLIC PANEL
Albumin: 2.9 g/dL — ABNORMAL LOW (ref 3.4–5.0)
Alkaline Phosphatase: 55 U/L
Anion Gap: 8 (ref 7–16)
BUN: 20 mg/dL — ABNORMAL HIGH (ref 7–18)
Calcium, Total: 8.7 mg/dL (ref 8.5–10.1)
Chloride: 105 mmol/L (ref 98–107)
EGFR (African American): 53 — ABNORMAL LOW
EGFR (Non-African Amer.): 46 — ABNORMAL LOW
Glucose: 102 mg/dL — ABNORMAL HIGH (ref 65–99)
Osmolality: 282 (ref 275–301)
Potassium: 4 mmol/L (ref 3.5–5.1)
SGPT (ALT): 25 U/L (ref 12–78)
Sodium: 140 mmol/L (ref 136–145)
Total Protein: 6.3 g/dL — ABNORMAL LOW (ref 6.4–8.2)

## 2013-08-20 ENCOUNTER — Ambulatory Visit: Payer: Self-pay | Admitting: Oncology

## 2013-08-20 ENCOUNTER — Ambulatory Visit: Payer: Self-pay | Admitting: Internal Medicine

## 2013-08-30 ENCOUNTER — Ambulatory Visit (INDEPENDENT_AMBULATORY_CARE_PROVIDER_SITE_OTHER): Payer: Medicare Other | Admitting: Cardiovascular Disease

## 2013-08-30 ENCOUNTER — Encounter: Payer: Self-pay | Admitting: Cardiovascular Disease

## 2013-08-30 VITALS — BP 116/70 | HR 72 | Ht 71.0 in | Wt 189.5 lb

## 2013-08-30 DIAGNOSIS — I4891 Unspecified atrial fibrillation: Secondary | ICD-10-CM

## 2013-08-30 DIAGNOSIS — C679 Malignant neoplasm of bladder, unspecified: Secondary | ICD-10-CM

## 2013-08-30 DIAGNOSIS — R0602 Shortness of breath: Secondary | ICD-10-CM

## 2013-08-30 NOTE — Patient Instructions (Signed)
You are doing well. No medication changes were made.  Please call the office if the leg swelling gets worse  Please call us if you have new issues that need to be addressed before your next appt.  Your physician wants you to follow-up in: 6 months.  You will receive a reminder letter in the mail two months in advance. If you don't receive a letter, please call our office to schedule the follow-up appointment.

## 2013-08-30 NOTE — Assessment & Plan Note (Signed)
Maintaining NSR. Suggested he continue on amiodarone. Unable to tolerate metoprolol secondary to hypotension.

## 2013-08-30 NOTE — Progress Notes (Signed)
Patient ID: Nagee Goates, male    DOB: October 03, 1945, 67 y.o.   MRN: [id number]  HPI Comments: Mr. 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  It was felt that he was not a good Coumadin candidate. He was given metoprolol, he converted back to normal sinus rhythm. He did have additional episodes of atrial fibrillation and was given amiodarone load with metoprolol in the hospital and has maintained normal sinus rhythm.   He denies any tachycardia or palpitations concerning for recurrent atrial fibrillation. Blood pressure at home has been labile, sometimes 90 to 160 systolic per his numbers from home. Average 130s to 140s. He reports having ankle edema which he attributes to chemotherapt He continues on amio. He stopped his metoprolol secondary to hypotension.  He is scheduled to restart chemotherapy next week Lab work in the hospital, creatinine 1.77, BUN 41, TSH 0.65  Echocardiogram dated 04/23/2013 normal LV systolic function ejection fraction 50-55%,   EKG shows normal sinus rhythm with rate 72 beats per minute, no significant ST or T wave changes     Outpatient Encounter Prescriptions as of 08/30/2013  Medication Sig  . amiodarone (PACERONE) 200 MG tablet Take 1 tablet (200 mg total) by mouth daily.  Marland Kitchen aspirin 81 MG tablet Take 81 mg by mouth daily.  . predniSONE (STERAPRED UNI-PAK) 10 MG tablet Take by mouth as directed.  . [DISCONTINUED] cyclobenzaprine (FLEXERIL) 10 MG tablet Take 10 mg by mouth 3 (three) times daily as needed for muscle spasms.  . [DISCONTINUED] HYDROcodone-acetaminophen (NORCO/VICODIN) 5-325 MG per tablet Take 1 tablet by mouth every 4 (four) hours as  needed for pain.  . [DISCONTINUED] metoprolol tartrate (LOPRESSOR) 25 MG tablet Take 25 mg by mouth 2 (two) times daily.   . [DISCONTINUED] traZODone (DESYREL) 50 MG tablet Take 25 mg by mouth at bedtime.     Review of Systems  Constitutional: Negative.   HENT: Negative.   Eyes: Negative.   Respiratory: Negative.   Cardiovascular: Positive for leg swelling.  Gastrointestinal: Negative.   Endocrine: Negative.   Musculoskeletal: Positive for arthralgias.       Right hip pain  Skin: Negative.   Allergic/Immunologic: Negative.   Neurological: Negative.   Hematological: Negative.   Psychiatric/Behavioral: Negative.   All other systems reviewed and are negative.    BP 116/70  Pulse 72  Ht 5\' 11"  (1.803 m)  Wt 189 lb 8 oz (85.957 kg)  BMI 26.44 kg/m2  Physical Exam  Nursing note and vitals reviewed. Constitutional: He is oriented to person, place, and time. He appears well-developed and well-nourished.  HENT:  Head: Normocephalic.  Nose: Nose normal.  Mouth/Throat: Oropharynx is clear and moist.  Eyes: Conjunctivae are normal. Pupils are equal, round, and reactive to light.  Neck: Normal range of motion. Neck supple. No JVD present.  Cardiovascular: Normal rate, regular rhythm, S1 normal, S2 normal, normal heart sounds and intact distal pulses.  Exam reveals no gallop and no friction rub.   No murmur heard. Pulmonary/Chest: Effort normal and breath sounds normal. No respiratory distress. He has no wheezes. He has no rales. He exhibits no tenderness.  Abdominal: Soft. Bowel sounds are normal. He exhibits no distension. There is no tenderness.  Musculoskeletal: Normal range  of motion. He exhibits no edema and no tenderness.  Lymphadenopathy:    He has no cervical adenopathy.  Neurological: He is alert and oriented to person, place, and time. Coordination normal.  Skin: Skin is warm and dry. No rash noted. No erythema.  Psychiatric: He has a normal mood and affect. His behavior  is normal. Judgment and thought content normal.      Assessment and Plan

## 2013-08-30 NOTE — Assessment & Plan Note (Signed)
He reports having a 3 month break before repeat scaning. H/o lesion to his hip.

## 2013-09-06 LAB — COMPREHENSIVE METABOLIC PANEL
Albumin: 3.1 g/dL — ABNORMAL LOW (ref 3.4–5.0)
Alkaline Phosphatase: 58 U/L
Anion Gap: 6 — ABNORMAL LOW (ref 7–16)
BUN: 21 mg/dL — ABNORMAL HIGH (ref 7–18)
Bilirubin,Total: 0.2 mg/dL (ref 0.2–1.0)
Chloride: 107 mmol/L (ref 98–107)
Co2: 28 mmol/L (ref 21–32)
Creatinine: 1.57 mg/dL — ABNORMAL HIGH (ref 0.60–1.30)
EGFR (Non-African Amer.): 44 — ABNORMAL LOW
Potassium: 4.6 mmol/L (ref 3.5–5.1)
SGOT(AST): 18 U/L (ref 15–37)
SGPT (ALT): 22 U/L (ref 12–78)
Sodium: 141 mmol/L (ref 136–145)
Total Protein: 6.9 g/dL (ref 6.4–8.2)

## 2013-09-06 LAB — CBC CANCER CENTER
Eosinophil #: 0.2 x10 3/mm (ref 0.0–0.7)
Eosinophil %: 3.1 %
HCT: 33.9 % — ABNORMAL LOW (ref 40.0–52.0)
HGB: 10.7 g/dL — ABNORMAL LOW (ref 13.0–18.0)
Lymphocyte #: 1.1 x10 3/mm (ref 1.0–3.6)
Lymphocyte %: 18.9 %
MCH: 32.1 pg (ref 26.0–34.0)
MCV: 101 fL — ABNORMAL HIGH (ref 80–100)
Monocyte #: 0.8 x10 3/mm (ref 0.2–1.0)
Monocyte %: 15.2 %
Neutrophil #: 3.4 x10 3/mm (ref 1.4–6.5)
RBC: 3.34 10*6/uL — ABNORMAL LOW (ref 4.40–5.90)
WBC: 5.6 x10 3/mm (ref 3.8–10.6)

## 2013-09-20 ENCOUNTER — Ambulatory Visit: Payer: Self-pay | Admitting: Oncology

## 2013-09-20 ENCOUNTER — Ambulatory Visit: Payer: Self-pay | Admitting: Internal Medicine

## 2013-09-25 LAB — CBC CANCER CENTER
BASOS ABS: 0 x10 3/mm (ref 0.0–0.1)
Basophil %: 0.6 %
EOS ABS: 0.2 x10 3/mm (ref 0.0–0.7)
EOS PCT: 3.7 %
HCT: 36.8 % — ABNORMAL LOW (ref 40.0–52.0)
HGB: 11.6 g/dL — ABNORMAL LOW (ref 13.0–18.0)
LYMPHS PCT: 17.4 %
Lymphocyte #: 1 x10 3/mm (ref 1.0–3.6)
MCH: 30.8 pg (ref 26.0–34.0)
MCHC: 31.7 g/dL — ABNORMAL LOW (ref 32.0–36.0)
MCV: 97 fL (ref 80–100)
Monocyte #: 0.8 x10 3/mm (ref 0.2–1.0)
Monocyte %: 13.3 %
Neutrophil #: 3.9 x10 3/mm (ref 1.4–6.5)
Neutrophil %: 65 %
Platelet: 220 x10 3/mm (ref 150–440)
RBC: 3.78 10*6/uL — ABNORMAL LOW (ref 4.40–5.90)
RDW: 16.7 % — ABNORMAL HIGH (ref 11.5–14.5)
WBC: 6 x10 3/mm (ref 3.8–10.6)

## 2013-09-25 LAB — BASIC METABOLIC PANEL
Anion Gap: 8 (ref 7–16)
BUN: 31 mg/dL — ABNORMAL HIGH (ref 7–18)
CHLORIDE: 101 mmol/L (ref 98–107)
CO2: 29 mmol/L (ref 21–32)
CREATININE: 2 mg/dL — AB (ref 0.60–1.30)
Calcium, Total: 9.8 mg/dL (ref 8.5–10.1)
EGFR (Non-African Amer.): 33 — ABNORMAL LOW
GFR CALC AF AMER: 38 — AB
Glucose: 109 mg/dL — ABNORMAL HIGH (ref 65–99)
Osmolality: 283 (ref 275–301)
POTASSIUM: 4.1 mmol/L (ref 3.5–5.1)
Sodium: 138 mmol/L (ref 136–145)

## 2013-10-18 ENCOUNTER — Other Ambulatory Visit: Payer: Self-pay | Admitting: Cardiovascular Disease

## 2013-10-21 ENCOUNTER — Ambulatory Visit: Payer: Self-pay | Admitting: Internal Medicine

## 2013-10-21 ENCOUNTER — Ambulatory Visit: Payer: Self-pay | Admitting: Oncology

## 2013-10-23 ENCOUNTER — Ambulatory Visit: Payer: Self-pay | Admitting: Oncology

## 2013-10-23 LAB — BASIC METABOLIC PANEL
Anion Gap: 8 (ref 7–16)
BUN: 23 mg/dL — ABNORMAL HIGH (ref 7–18)
CO2: 26 mmol/L (ref 21–32)
CREATININE: 1.71 mg/dL — AB (ref 0.60–1.30)
Calcium, Total: 8.3 mg/dL — ABNORMAL LOW (ref 8.5–10.1)
Chloride: 108 mmol/L — ABNORMAL HIGH (ref 98–107)
EGFR (African American): 46 — ABNORMAL LOW
EGFR (Non-African Amer.): 40 — ABNORMAL LOW
Glucose: 91 mg/dL (ref 65–99)
Osmolality: 286 (ref 275–301)
Potassium: 4.4 mmol/L (ref 3.5–5.1)
SODIUM: 142 mmol/L (ref 136–145)

## 2013-11-06 ENCOUNTER — Encounter: Payer: Medicare Other | Admitting: Internal Medicine

## 2013-11-18 ENCOUNTER — Ambulatory Visit: Payer: Self-pay | Admitting: Internal Medicine

## 2013-11-22 ENCOUNTER — Ambulatory Visit: Payer: Self-pay | Admitting: Oncology

## 2013-11-22 LAB — CBC CANCER CENTER
BASOS ABS: 0 x10 3/mm (ref 0.0–0.1)
Basophil %: 0.5 %
EOS ABS: 0.3 x10 3/mm (ref 0.0–0.7)
EOS PCT: 4.5 %
HCT: 36.3 % — AB (ref 40.0–52.0)
HGB: 11.6 g/dL — ABNORMAL LOW (ref 13.0–18.0)
LYMPHS ABS: 1.1 x10 3/mm (ref 1.0–3.6)
Lymphocyte %: 18 %
MCH: 28.6 pg (ref 26.0–34.0)
MCHC: 31.9 g/dL — ABNORMAL LOW (ref 32.0–36.0)
MCV: 90 fL (ref 80–100)
MONOS PCT: 14.8 %
Monocyte #: 0.9 x10 3/mm (ref 0.2–1.0)
Neutrophil #: 3.6 x10 3/mm (ref 1.4–6.5)
Neutrophil %: 62.2 %
PLATELETS: 225 x10 3/mm (ref 150–440)
RBC: 4.05 10*6/uL — ABNORMAL LOW (ref 4.40–5.90)
RDW: 16.5 % — ABNORMAL HIGH (ref 11.5–14.5)
WBC: 5.9 x10 3/mm (ref 3.8–10.6)

## 2013-11-22 LAB — BASIC METABOLIC PANEL
Anion Gap: 9 (ref 7–16)
BUN: 29 mg/dL — AB (ref 7–18)
CHLORIDE: 104 mmol/L (ref 98–107)
Calcium, Total: 8.3 mg/dL — ABNORMAL LOW (ref 8.5–10.1)
Co2: 26 mmol/L (ref 21–32)
Creatinine: 2.08 mg/dL — ABNORMAL HIGH (ref 0.60–1.30)
EGFR (African American): 36 — ABNORMAL LOW
GFR CALC NON AF AMER: 31 — AB
GLUCOSE: 79 mg/dL (ref 65–99)
OSMOLALITY: 282 (ref 275–301)
Potassium: 4.6 mmol/L (ref 3.5–5.1)
SODIUM: 139 mmol/L (ref 136–145)

## 2013-11-27 ENCOUNTER — Ambulatory Visit: Payer: Self-pay | Admitting: Urology

## 2013-12-03 ENCOUNTER — Inpatient Hospital Stay: Payer: Self-pay | Admitting: Internal Medicine

## 2013-12-03 LAB — BASIC METABOLIC PANEL
Anion Gap: 7 (ref 7–16)
BUN: 32 mg/dL — ABNORMAL HIGH (ref 7–18)
Calcium, Total: 7.9 mg/dL — ABNORMAL LOW (ref 8.5–10.1)
Chloride: 108 mmol/L — ABNORMAL HIGH (ref 98–107)
Co2: 21 mmol/L (ref 21–32)
Creatinine: 2.59 mg/dL — ABNORMAL HIGH (ref 0.60–1.30)
EGFR (African American): 28 — ABNORMAL LOW
EGFR (Non-African Amer.): 24 — ABNORMAL LOW
GLUCOSE: 108 mg/dL — AB (ref 65–99)
Osmolality: 279 (ref 275–301)
Potassium: 4.5 mmol/L (ref 3.5–5.1)
Sodium: 136 mmol/L (ref 136–145)

## 2013-12-03 LAB — URINALYSIS, COMPLETE: Bacteria: NEGATIVE

## 2013-12-03 LAB — CBC
HCT: 35 % — ABNORMAL LOW (ref 40.0–52.0)
HGB: 11 g/dL — ABNORMAL LOW (ref 13.0–18.0)
MCH: 28.1 pg (ref 26.0–34.0)
MCHC: 31.6 g/dL — AB (ref 32.0–36.0)
MCV: 89 fL (ref 80–100)
Platelet: 282 10*3/uL (ref 150–440)
RBC: 3.94 10*6/uL — ABNORMAL LOW (ref 4.40–5.90)
RDW: 16.6 % — ABNORMAL HIGH (ref 11.5–14.5)
WBC: 9.6 10*3/uL (ref 3.8–10.6)

## 2013-12-04 LAB — CBC WITH DIFFERENTIAL/PLATELET
BASOS PCT: 0.5 %
Basophil #: 0 10*3/uL (ref 0.0–0.1)
Eosinophil #: 0.1 10*3/uL (ref 0.0–0.7)
Eosinophil %: 1.2 %
HCT: 30.6 % — ABNORMAL LOW (ref 40.0–52.0)
HGB: 10.1 g/dL — ABNORMAL LOW (ref 13.0–18.0)
LYMPHS PCT: 12.2 %
Lymphocyte #: 1.1 10*3/uL (ref 1.0–3.6)
MCH: 29.3 pg (ref 26.0–34.0)
MCHC: 33 g/dL (ref 32.0–36.0)
MCV: 89 fL (ref 80–100)
Monocyte #: 0.9 x10 3/mm (ref 0.2–1.0)
Monocyte %: 10.7 %
NEUTROS PCT: 75.4 %
Neutrophil #: 6.6 10*3/uL — ABNORMAL HIGH (ref 1.4–6.5)
Platelet: 234 10*3/uL (ref 150–440)
RBC: 3.45 10*6/uL — AB (ref 4.40–5.90)
RDW: 16.4 % — ABNORMAL HIGH (ref 11.5–14.5)
WBC: 8.7 10*3/uL (ref 3.8–10.6)

## 2013-12-04 LAB — BASIC METABOLIC PANEL
Anion Gap: 6 — ABNORMAL LOW (ref 7–16)
BUN: 37 mg/dL — AB (ref 7–18)
CALCIUM: 8 mg/dL — AB (ref 8.5–10.1)
CHLORIDE: 110 mmol/L — AB (ref 98–107)
Co2: 22 mmol/L (ref 21–32)
Creatinine: 2.45 mg/dL — ABNORMAL HIGH (ref 0.60–1.30)
EGFR (African American): 30 — ABNORMAL LOW
EGFR (Non-African Amer.): 26 — ABNORMAL LOW
GLUCOSE: 89 mg/dL (ref 65–99)
OSMOLALITY: 284 (ref 275–301)
Potassium: 4.6 mmol/L (ref 3.5–5.1)
SODIUM: 138 mmol/L (ref 136–145)

## 2013-12-04 LAB — MAGNESIUM: Magnesium: 2.5 mg/dL — ABNORMAL HIGH

## 2013-12-05 LAB — BASIC METABOLIC PANEL
Anion Gap: 2 — ABNORMAL LOW (ref 7–16)
BUN: 37 mg/dL — ABNORMAL HIGH (ref 7–18)
CREATININE: 2.77 mg/dL — AB (ref 0.60–1.30)
Calcium, Total: 6.9 mg/dL — CL (ref 8.5–10.1)
Chloride: 115 mmol/L — ABNORMAL HIGH (ref 98–107)
Co2: 23 mmol/L (ref 21–32)
EGFR (African American): 26 — ABNORMAL LOW
GFR CALC NON AF AMER: 22 — AB
Glucose: 100 mg/dL — ABNORMAL HIGH (ref 65–99)
Osmolality: 288 (ref 275–301)
POTASSIUM: 4.9 mmol/L (ref 3.5–5.1)
SODIUM: 140 mmol/L (ref 136–145)

## 2013-12-05 LAB — HEMOGLOBIN: HGB: 8.5 g/dL — ABNORMAL LOW (ref 13.0–18.0)

## 2013-12-05 LAB — ALBUMIN: Albumin: 2.6 g/dL — ABNORMAL LOW (ref 3.4–5.0)

## 2013-12-06 DIAGNOSIS — I059 Rheumatic mitral valve disease, unspecified: Secondary | ICD-10-CM

## 2013-12-06 DIAGNOSIS — I469 Cardiac arrest, cause unspecified: Secondary | ICD-10-CM

## 2013-12-06 DIAGNOSIS — I4891 Unspecified atrial fibrillation: Secondary | ICD-10-CM

## 2013-12-06 LAB — BASIC METABOLIC PANEL
Anion Gap: 5 — ABNORMAL LOW (ref 7–16)
Anion Gap: 4 — ABNORMAL LOW (ref 7–16)
BUN: 45 mg/dL — ABNORMAL HIGH (ref 7–18)
BUN: 43 mg/dL — AB (ref 7–18)
CHLORIDE: 111 mmol/L — AB (ref 98–107)
Calcium, Total: 6.9 mg/dL — CL (ref 8.5–10.1)
Calcium, Total: 6.8 mg/dL — CL (ref 8.5–10.1)
Chloride: 113 mmol/L — ABNORMAL HIGH (ref 98–107)
Co2: 20 mmol/L — ABNORMAL LOW (ref 21–32)
Co2: 19 mmol/L — ABNORMAL LOW (ref 21–32)
Creatinine: 3.99 mg/dL — ABNORMAL HIGH (ref 0.60–1.30)
Creatinine: 4.36 mg/dL — ABNORMAL HIGH (ref 0.60–1.30)
EGFR (African American): 15 — ABNORMAL LOW
EGFR (Non-African Amer.): 14 — ABNORMAL LOW
EGFR (Non-African Amer.): 13 — ABNORMAL LOW
GFR CALC AF AMER: 17 — AB
Glucose: 125 mg/dL — ABNORMAL HIGH (ref 65–99)
Glucose: 105 mg/dL — ABNORMAL HIGH (ref 65–99)
Osmolality: 285 (ref 275–301)
Osmolality: 283 (ref 275–301)
POTASSIUM: 5.8 mmol/L — AB (ref 3.5–5.1)
POTASSIUM: 6 mmol/L — AB (ref 3.5–5.1)
SODIUM: 136 mmol/L (ref 136–145)
Sodium: 136 mmol/L (ref 136–145)

## 2013-12-06 LAB — CBC WITH DIFFERENTIAL/PLATELET
Basophil #: 0 10*3/uL (ref 0.0–0.1)
Basophil %: 0.3 %
EOS PCT: 1.4 %
Eosinophil #: 0.1 10*3/uL (ref 0.0–0.7)
HCT: 22.4 % — AB (ref 40.0–52.0)
HGB: 7.3 g/dL — ABNORMAL LOW (ref 13.0–18.0)
LYMPHS ABS: 0.7 10*3/uL — AB (ref 1.0–3.6)
Lymphocyte %: 7.4 %
MCH: 29.1 pg (ref 26.0–34.0)
MCHC: 32.6 g/dL (ref 32.0–36.0)
MCV: 89 fL (ref 80–100)
MONO ABS: 1.2 x10 3/mm — AB (ref 0.2–1.0)
MONOS PCT: 12.8 %
Neutrophil #: 7.3 10*3/uL — ABNORMAL HIGH (ref 1.4–6.5)
Neutrophil %: 78.1 %
PLATELETS: 224 10*3/uL (ref 150–440)
RBC: 2.51 10*6/uL — AB (ref 4.40–5.90)
RDW: 16.3 % — ABNORMAL HIGH (ref 11.5–14.5)
WBC: 9.4 10*3/uL (ref 3.8–10.6)

## 2013-12-06 LAB — HEMATOCRIT: HCT: 25.1 % — ABNORMAL LOW (ref 40.0–52.0)

## 2013-12-06 LAB — HEMOGLOBIN: HGB: 8.2 g/dL — AB (ref 13.0–18.0)

## 2013-12-07 DIAGNOSIS — I4901 Ventricular fibrillation: Secondary | ICD-10-CM

## 2013-12-07 LAB — CBC WITH DIFFERENTIAL/PLATELET
Basophil #: 0 10*3/uL (ref 0.0–0.1)
Basophil %: 0.2 %
Eosinophil #: 0 10*3/uL (ref 0.0–0.7)
Eosinophil %: 0.1 %
HCT: 24.3 % — AB (ref 40.0–52.0)
HGB: 8.1 g/dL — ABNORMAL LOW (ref 13.0–18.0)
Lymphocyte #: 0.8 10*3/uL — ABNORMAL LOW (ref 1.0–3.6)
Lymphocyte %: 6.5 %
MCH: 29.3 pg (ref 26.0–34.0)
MCHC: 33.3 g/dL (ref 32.0–36.0)
MCV: 88 fL (ref 80–100)
MONOS PCT: 11.3 %
Monocyte #: 1.5 x10 3/mm — ABNORMAL HIGH (ref 0.2–1.0)
NEUTROS ABS: 10.7 10*3/uL — AB (ref 1.4–6.5)
NEUTROS PCT: 81.9 %
Platelet: 225 10*3/uL (ref 150–440)
RBC: 2.75 10*6/uL — ABNORMAL LOW (ref 4.40–5.90)
RDW: 16.2 % — ABNORMAL HIGH (ref 11.5–14.5)
WBC: 13 10*3/uL — AB (ref 3.8–10.6)

## 2013-12-07 LAB — BASIC METABOLIC PANEL
Anion Gap: 8 (ref 7–16)
BUN: 46 mg/dL — ABNORMAL HIGH (ref 7–18)
Calcium, Total: 7.7 mg/dL — ABNORMAL LOW (ref 8.5–10.1)
Chloride: 112 mmol/L — ABNORMAL HIGH (ref 98–107)
Co2: 19 mmol/L — ABNORMAL LOW (ref 21–32)
Creatinine: 5.24 mg/dL — ABNORMAL HIGH (ref 0.60–1.30)
EGFR (African American): 12 — ABNORMAL LOW
EGFR (Non-African Amer.): 10 — ABNORMAL LOW
GLUCOSE: 94 mg/dL (ref 65–99)
OSMOLALITY: 289 (ref 275–301)
Potassium: 4.8 mmol/L (ref 3.5–5.1)
Sodium: 139 mmol/L (ref 136–145)

## 2013-12-08 LAB — BASIC METABOLIC PANEL
Anion Gap: 5 — ABNORMAL LOW (ref 7–16)
BUN: 50 mg/dL — ABNORMAL HIGH (ref 7–18)
CO2: 28 mmol/L (ref 21–32)
CREATININE: 5.45 mg/dL — AB (ref 0.60–1.30)
Calcium, Total: 7.1 mg/dL — ABNORMAL LOW (ref 8.5–10.1)
Chloride: 106 mmol/L (ref 98–107)
EGFR (African American): 11 — ABNORMAL LOW
EGFR (Non-African Amer.): 10 — ABNORMAL LOW
Glucose: 101 mg/dL — ABNORMAL HIGH (ref 65–99)
OSMOLALITY: 291 (ref 275–301)
POTASSIUM: 4.2 mmol/L (ref 3.5–5.1)
Sodium: 139 mmol/L (ref 136–145)

## 2013-12-09 LAB — BASIC METABOLIC PANEL
ANION GAP: 4 — AB (ref 7–16)
BUN: 47 mg/dL — AB (ref 7–18)
Calcium, Total: 6.8 mg/dL — CL (ref 8.5–10.1)
Chloride: 106 mmol/L (ref 98–107)
Co2: 29 mmol/L (ref 21–32)
Creatinine: 4.29 mg/dL — ABNORMAL HIGH (ref 0.60–1.30)
EGFR (African American): 15 — ABNORMAL LOW
EGFR (Non-African Amer.): 13 — ABNORMAL LOW
Glucose: 86 mg/dL (ref 65–99)
OSMOLALITY: 289 (ref 275–301)
Potassium: 3.8 mmol/L (ref 3.5–5.1)
Sodium: 139 mmol/L (ref 136–145)

## 2013-12-09 LAB — CBC WITH DIFFERENTIAL/PLATELET
BASOS PCT: 0.4 %
Basophil #: 0 10*3/uL (ref 0.0–0.1)
EOS ABS: 0.2 10*3/uL (ref 0.0–0.7)
Eosinophil %: 2.7 %
HCT: 20.1 % — ABNORMAL LOW (ref 40.0–52.0)
HGB: 6.6 g/dL — AB (ref 13.0–18.0)
LYMPHS ABS: 0.6 10*3/uL — AB (ref 1.0–3.6)
Lymphocyte %: 8.4 %
MCH: 28.8 pg (ref 26.0–34.0)
MCHC: 33 g/dL (ref 32.0–36.0)
MCV: 87 fL (ref 80–100)
Monocyte #: 1.1 x10 3/mm — ABNORMAL HIGH (ref 0.2–1.0)
Monocyte %: 14.5 %
NEUTROS PCT: 74 %
Neutrophil #: 5.5 10*3/uL (ref 1.4–6.5)
Platelet: 212 10*3/uL (ref 150–440)
RBC: 2.3 10*6/uL — ABNORMAL LOW (ref 4.40–5.90)
RDW: 16.4 % — AB (ref 11.5–14.5)
WBC: 7.5 10*3/uL (ref 3.8–10.6)

## 2013-12-10 LAB — BASIC METABOLIC PANEL
Anion Gap: 6 — ABNORMAL LOW (ref 7–16)
BUN: 42 mg/dL — AB (ref 7–18)
CREATININE: 2.9 mg/dL — AB (ref 0.60–1.30)
Calcium, Total: 6.6 mg/dL — CL (ref 8.5–10.1)
Chloride: 107 mmol/L (ref 98–107)
Co2: 27 mmol/L (ref 21–32)
EGFR (Non-African Amer.): 21 — ABNORMAL LOW
GFR CALC AF AMER: 24 — AB
GLUCOSE: 85 mg/dL (ref 65–99)
Osmolality: 289 (ref 275–301)
POTASSIUM: 3.6 mmol/L (ref 3.5–5.1)
Sodium: 140 mmol/L (ref 136–145)

## 2013-12-10 LAB — CBC WITH DIFFERENTIAL/PLATELET
Basophil #: 0 10*3/uL (ref 0.0–0.1)
Basophil %: 0.4 %
Eosinophil #: 0.2 10*3/uL (ref 0.0–0.7)
Eosinophil %: 2.6 %
HCT: 23.4 % — AB (ref 40.0–52.0)
HGB: 7.9 g/dL — AB (ref 13.0–18.0)
Lymphocyte #: 0.6 10*3/uL — ABNORMAL LOW (ref 1.0–3.6)
Lymphocyte %: 8.8 %
MCH: 29.4 pg (ref 26.0–34.0)
MCHC: 33.8 g/dL (ref 32.0–36.0)
MCV: 87 fL (ref 80–100)
MONOS PCT: 16.7 %
Monocyte #: 1.2 x10 3/mm — ABNORMAL HIGH (ref 0.2–1.0)
Neutrophil #: 5.3 10*3/uL (ref 1.4–6.5)
Neutrophil %: 71.5 %
PLATELETS: 225 10*3/uL (ref 150–440)
RBC: 2.68 10*6/uL — ABNORMAL LOW (ref 4.40–5.90)
RDW: 16 % — ABNORMAL HIGH (ref 11.5–14.5)
WBC: 7.4 10*3/uL (ref 3.8–10.6)

## 2013-12-11 LAB — BASIC METABOLIC PANEL
Anion Gap: 5 — ABNORMAL LOW (ref 7–16)
BUN: 31 mg/dL — ABNORMAL HIGH (ref 7–18)
CO2: 25 mmol/L (ref 21–32)
Calcium, Total: 6.4 mg/dL — CL (ref 8.5–10.1)
Chloride: 109 mmol/L — ABNORMAL HIGH (ref 98–107)
Creatinine: 2.11 mg/dL — ABNORMAL HIGH (ref 0.60–1.30)
EGFR (African American): 36 — ABNORMAL LOW
EGFR (Non-African Amer.): 31 — ABNORMAL LOW
GLUCOSE: 80 mg/dL (ref 65–99)
Osmolality: 283 (ref 275–301)
Potassium: 3.9 mmol/L (ref 3.5–5.1)
SODIUM: 139 mmol/L (ref 136–145)

## 2013-12-11 LAB — CBC WITH DIFFERENTIAL/PLATELET
BASOS ABS: 0 10*3/uL (ref 0.0–0.1)
Basophil %: 0.2 %
EOS PCT: 1.2 %
Eosinophil #: 0.1 10*3/uL (ref 0.0–0.7)
HCT: 23 % — ABNORMAL LOW (ref 40.0–52.0)
HGB: 7.7 g/dL — AB (ref 13.0–18.0)
Lymphocyte #: 0.6 10*3/uL — ABNORMAL LOW (ref 1.0–3.6)
Lymphocyte %: 6.8 %
MCH: 29.6 pg (ref 26.0–34.0)
MCHC: 33.5 g/dL (ref 32.0–36.0)
MCV: 88 fL (ref 80–100)
MONOS PCT: 13.8 %
Monocyte #: 1.2 x10 3/mm — ABNORMAL HIGH (ref 0.2–1.0)
Neutrophil #: 6.9 10*3/uL — ABNORMAL HIGH (ref 1.4–6.5)
Neutrophil %: 78 %
PLATELETS: 242 10*3/uL (ref 150–440)
RBC: 2.61 10*6/uL — ABNORMAL LOW (ref 4.40–5.90)
RDW: 15.9 % — ABNORMAL HIGH (ref 11.5–14.5)
WBC: 8.8 10*3/uL (ref 3.8–10.6)

## 2013-12-11 LAB — MISC AER/ANAEROBIC CULT.

## 2013-12-12 LAB — CBC WITH DIFFERENTIAL/PLATELET
BASOS ABS: 0.1 10*3/uL (ref 0.0–0.1)
BASOS PCT: 0.5 %
EOS ABS: 0.3 10*3/uL (ref 0.0–0.7)
Eosinophil %: 2.9 %
HCT: 23.9 % — AB (ref 40.0–52.0)
HGB: 7.7 g/dL — ABNORMAL LOW (ref 13.0–18.0)
LYMPHS ABS: 0.8 10*3/uL — AB (ref 1.0–3.6)
LYMPHS PCT: 7.8 %
MCH: 28.6 pg (ref 26.0–34.0)
MCHC: 32.1 g/dL (ref 32.0–36.0)
MCV: 89 fL (ref 80–100)
MONOS PCT: 10.2 %
Monocyte #: 1 x10 3/mm (ref 0.2–1.0)
NEUTROS ABS: 8 10*3/uL — AB (ref 1.4–6.5)
NEUTROS PCT: 78.6 %
PLATELETS: 264 10*3/uL (ref 150–440)
RBC: 2.68 10*6/uL — ABNORMAL LOW (ref 4.40–5.90)
RDW: 16.2 % — ABNORMAL HIGH (ref 11.5–14.5)
WBC: 10.1 10*3/uL (ref 3.8–10.6)

## 2013-12-12 LAB — BASIC METABOLIC PANEL
Anion Gap: 5 — ABNORMAL LOW (ref 7–16)
BUN: 29 mg/dL — ABNORMAL HIGH (ref 7–18)
CREATININE: 2.08 mg/dL — AB (ref 0.60–1.30)
Calcium, Total: 6.4 mg/dL — CL (ref 8.5–10.1)
Chloride: 110 mmol/L — ABNORMAL HIGH (ref 98–107)
Co2: 26 mmol/L (ref 21–32)
EGFR (African American): 36 — ABNORMAL LOW
GFR CALC NON AF AMER: 31 — AB
Glucose: 80 mg/dL (ref 65–99)
OSMOLALITY: 286 (ref 275–301)
POTASSIUM: 4.1 mmol/L (ref 3.5–5.1)
Sodium: 141 mmol/L (ref 136–145)

## 2013-12-12 LAB — PATHOLOGY REPORT

## 2013-12-14 ENCOUNTER — Telehealth: Payer: Self-pay

## 2013-12-14 NOTE — Telephone Encounter (Signed)
Mrs Doolin said pt was discharged on 12/13/13 from [facility] and [facility] doctor wrote rx for belladona opium supp with instructions one suppository rectally q8h prn pain. Pharmacy cannot accept rx without dosage. Mrs Schools is unable to reach [facility] physician and request Dr Silvio Pate to write new rx for belladona supp. Mrs Lewallen son, Marlinda Mike will pick up rx.and request cb to [facility].Please advise.

## 2013-12-14 NOTE — Telephone Encounter (Signed)
I don't prescribe belladona and am not really comfortable with using that medication. I haven't seen him in a while and am not really comfortable prescribing any powerful meds in that setting

## 2013-12-14 NOTE — Telephone Encounter (Signed)
Spoke with stepson and advised results

## 2013-12-17 ENCOUNTER — Telehealth: Payer: Self-pay | Admitting: Internal Medicine

## 2013-12-17 NOTE — Telephone Encounter (Signed)
Called to check on him He states that he noted a decline in urine output from one of his urostomy tubes since yesterday. Left message for him to call Dr Grayland Ormond to see if he could facilitate a reeval in radiology  Left voice message for Dr Suzi Roots to radiologist---would probably be able to check the function of the tube without ER visit if it wasn't working Will check back with the patient tomorrow

## 2013-12-17 NOTE — Telephone Encounter (Signed)
Got phone call from Dr Grayland Ormond He has arranged appt in radiology for tube repositioning on Wednesday

## 2013-12-19 ENCOUNTER — Ambulatory Visit: Payer: Self-pay | Admitting: Internal Medicine

## 2013-12-19 ENCOUNTER — Ambulatory Visit: Payer: Self-pay | Admitting: Oncology

## 2013-12-26 DIAGNOSIS — C7952 Secondary malignant neoplasm of bone marrow: Secondary | ICD-10-CM

## 2013-12-26 DIAGNOSIS — N183 Chronic kidney disease, stage 3 unspecified: Secondary | ICD-10-CM

## 2013-12-26 DIAGNOSIS — C679 Malignant neoplasm of bladder, unspecified: Secondary | ICD-10-CM

## 2013-12-26 DIAGNOSIS — C787 Secondary malignant neoplasm of liver and intrahepatic bile duct: Secondary | ICD-10-CM

## 2013-12-26 DIAGNOSIS — C7951 Secondary malignant neoplasm of bone: Secondary | ICD-10-CM

## 2013-12-27 ENCOUNTER — Ambulatory Visit: Payer: Self-pay | Admitting: Oncology

## 2013-12-27 LAB — COMPREHENSIVE METABOLIC PANEL
ALT: 44 U/L (ref 12–78)
Albumin: 2.3 g/dL — ABNORMAL LOW (ref 3.4–5.0)
Alkaline Phosphatase: 260 U/L — ABNORMAL HIGH
Anion Gap: 5 — ABNORMAL LOW (ref 7–16)
BUN: 28 mg/dL — ABNORMAL HIGH (ref 7–18)
Bilirubin,Total: 0.3 mg/dL (ref 0.2–1.0)
CO2: 23 mmol/L (ref 21–32)
Calcium, Total: 7.8 mg/dL — ABNORMAL LOW (ref 8.5–10.1)
Chloride: 107 mmol/L (ref 98–107)
Creatinine: 1.94 mg/dL — ABNORMAL HIGH (ref 0.60–1.30)
EGFR (Non-African Amer.): 34 — ABNORMAL LOW
GFR CALC AF AMER: 39 — AB
GLUCOSE: 135 mg/dL — AB (ref 65–99)
Osmolality: 278 (ref 275–301)
POTASSIUM: 4.7 mmol/L (ref 3.5–5.1)
SGOT(AST): 63 U/L — ABNORMAL HIGH (ref 15–37)
Sodium: 135 mmol/L — ABNORMAL LOW (ref 136–145)
Total Protein: 7.3 g/dL (ref 6.4–8.2)

## 2013-12-27 LAB — CBC CANCER CENTER
BASOS PCT: 0.2 %
Basophil #: 0 x10 3/mm (ref 0.0–0.1)
Eosinophil #: 0.1 x10 3/mm (ref 0.0–0.7)
Eosinophil %: 0.5 %
HCT: 24.4 % — ABNORMAL LOW (ref 40.0–52.0)
HGB: 7.7 g/dL — AB (ref 13.0–18.0)
LYMPHS ABS: 0.6 x10 3/mm — AB (ref 1.0–3.6)
LYMPHS PCT: 5.3 %
MCH: 27.6 pg (ref 26.0–34.0)
MCHC: 31.5 g/dL — ABNORMAL LOW (ref 32.0–36.0)
MCV: 88 fL (ref 80–100)
MONO ABS: 1.2 x10 3/mm — AB (ref 0.2–1.0)
Monocyte %: 11.3 %
NEUTROS PCT: 82.7 %
Neutrophil #: 9.2 x10 3/mm — ABNORMAL HIGH (ref 1.4–6.5)
PLATELETS: 408 x10 3/mm (ref 150–440)
RBC: 2.79 10*6/uL — ABNORMAL LOW (ref 4.40–5.90)
RDW: 16.4 % — ABNORMAL HIGH (ref 11.5–14.5)
WBC: 11.1 x10 3/mm — ABNORMAL HIGH (ref 3.8–10.6)

## 2013-12-27 LAB — MAGNESIUM: Magnesium: 2.1 mg/dL

## 2014-01-01 ENCOUNTER — Ambulatory Visit (INDEPENDENT_AMBULATORY_CARE_PROVIDER_SITE_OTHER): Payer: Medicare Other | Admitting: Internal Medicine

## 2014-01-01 ENCOUNTER — Encounter: Payer: Self-pay | Admitting: Internal Medicine

## 2014-01-01 VITALS — BP 110/60 | HR 97 | Temp 97.9°F | Wt 179.0 lb

## 2014-01-01 DIAGNOSIS — C7951 Secondary malignant neoplasm of bone: Secondary | ICD-10-CM

## 2014-01-01 DIAGNOSIS — C679 Malignant neoplasm of bladder, unspecified: Secondary | ICD-10-CM

## 2014-01-01 DIAGNOSIS — C7952 Secondary malignant neoplasm of bone marrow: Secondary | ICD-10-CM

## 2014-01-01 NOTE — Progress Notes (Signed)
   Subjective:    Patient ID: Cody Barry, male    DOB: 04-30-1946, 68 y.o.   MRN: [id number]  HPI Here with wife  Reviewed [facility] records Still has urostomy Did see Dr Grayland Ormond on 4/9--- offered chemo but still considering He isn't sure what to offer now--may repeat past Rx (he responded to this but with significant side effects--made him weak)  Mild hematuria-- both in some that he voids Some blood in urostomy on right side also  Some pain--but not often Wife notes--- that he spends all day in bed some days due to pain. Uses the pain pill if needed  Family is important to him Kyra Searles is important but not involved with church  Current Outpatient Prescriptions on File Prior to Visit  Medication Sig Dispense Refill  . amiodarone (PACERONE) 200 MG tablet TAKE 1 TABLET BY MOUTH DAILY.  30 tablet  3   No current facility-administered medications on file prior to visit.    Allergies  Allergen Reactions  . Penicillins     Past Medical History  Diagnosis Date  . ED (erectile dysfunction)   . BPH (benign prostatic hypertrophy)   . COPD (chronic obstructive pulmonary disease)   . Hyperlipidemia   . Bladder cancer 2014    Past Surgical History  Procedure Laterality Date  . Umbilical hernia repair    . Transurethral resection of bladder tumor  1/14    Dr Yves Dill    Family History  Problem Relation Age of Onset  . Hypertension Mother   . Coronary artery disease Neg Hx   . Diabetes Neg Hx   . Cancer Neg Hx     History   Social History  . Marital Status: Married    Spouse Name: N/A    Number of Children: 3  . Years of Education: N/A   Occupational History  . farmer- grain    Social History Main Topics  . Smoking status: Former Smoker -- 1.00 packs/day for 45 years    Types: Cigarettes    Quit date: 09/20/2006  . Smokeless tobacco: Never Used  . Alcohol Use: No     Comment: rare  . Drug Use: No  . Sexual Activity: Not on file   Other Topics Concern  .  Not on file   Social History Narrative   No living will   Would want wife as health care POA   Would want attempts at resuscitation   Not sure about tube feedings   Review of Systems Appetite is not good--- needs to have something that really appeals to him Lost 10# in past 4 months    Objective:   Physical Exam  Psychiatric:  At least reasonable insight but certainly some denial Wife seems to have better sense of the future          Assessment & Plan:

## 2014-01-01 NOTE — Progress Notes (Signed)
Pre visit review using our clinic review tool, if applicable. No additional management support is needed unless otherwise documented below in the visit note. 

## 2014-01-01 NOTE — Assessment & Plan Note (Signed)
And liver, etc  Assessed his feelings about treatment He still wants to be aggressive--even still wants CPR, etc Makes sense to try chemo again--- stop if ineffective or sig side effects.  Would then go ahead with hospice and I can manage his care at that time 25 minute --all care management and assessment

## 2014-01-03 ENCOUNTER — Emergency Department (HOSPITAL_BASED_OUTPATIENT_CLINIC_OR_DEPARTMENT_OTHER): Payer: Medicare Other

## 2014-01-03 ENCOUNTER — Telehealth: Payer: Self-pay

## 2014-01-03 ENCOUNTER — Encounter (HOSPITAL_BASED_OUTPATIENT_CLINIC_OR_DEPARTMENT_OTHER): Payer: Self-pay | Admitting: Emergency Medicine

## 2014-01-03 ENCOUNTER — Encounter (HOSPITAL_COMMUNITY): Payer: Self-pay | Admitting: Emergency Medicine

## 2014-01-03 ENCOUNTER — Emergency Department (HOSPITAL_BASED_OUTPATIENT_CLINIC_OR_DEPARTMENT_OTHER)
Admission: EM | Admit: 2014-01-03 | Discharge: 2014-01-04 | Disposition: A | Discharge status: Other Acute Inpatient Hospital | Payer: Medicare Other | Attending: Emergency Medicine | Admitting: Emergency Medicine

## 2014-01-03 DIAGNOSIS — Z880 Allergy status to penicillin: Secondary | ICD-10-CM | POA: Insufficient documentation

## 2014-01-03 DIAGNOSIS — Z87891 Personal history of nicotine dependence: Secondary | ICD-10-CM | POA: Insufficient documentation

## 2014-01-03 DIAGNOSIS — R319 Hematuria, unspecified: Secondary | ICD-10-CM

## 2014-01-03 DIAGNOSIS — C679 Malignant neoplasm of bladder, unspecified: Secondary | ICD-10-CM | POA: Insufficient documentation

## 2014-01-03 DIAGNOSIS — J4489 Other specified chronic obstructive pulmonary disease: Secondary | ICD-10-CM | POA: Insufficient documentation

## 2014-01-03 DIAGNOSIS — E785 Hyperlipidemia, unspecified: Secondary | ICD-10-CM | POA: Insufficient documentation

## 2014-01-03 DIAGNOSIS — Z79899 Other long term (current) drug therapy: Secondary | ICD-10-CM | POA: Insufficient documentation

## 2014-01-03 DIAGNOSIS — J449 Chronic obstructive pulmonary disease, unspecified: Secondary | ICD-10-CM | POA: Insufficient documentation

## 2014-01-03 DIAGNOSIS — C799 Secondary malignant neoplasm of unspecified site: Secondary | ICD-10-CM

## 2014-01-03 DIAGNOSIS — C787 Secondary malignant neoplasm of liver and intrahepatic bile duct: Secondary | ICD-10-CM | POA: Insufficient documentation

## 2014-01-03 DIAGNOSIS — D649 Anemia, unspecified: Secondary | ICD-10-CM | POA: Insufficient documentation

## 2014-01-03 DIAGNOSIS — Z8719 Personal history of other diseases of the digestive system: Secondary | ICD-10-CM | POA: Insufficient documentation

## 2014-01-03 DIAGNOSIS — N390 Urinary tract infection, site not specified: Secondary | ICD-10-CM | POA: Insufficient documentation

## 2014-01-03 LAB — CBC WITH DIFFERENTIAL/PLATELET
BAND NEUTROPHILS: 1 % (ref 0–10)
Basophils Absolute: 0 10*3/uL (ref 0.0–0.1)
Basophils Relative: 0 % (ref 0–1)
Blasts: 0 %
Eosinophils Absolute: 0.1 10*3/uL (ref 0.0–0.7)
Eosinophils Relative: 1 % (ref 0–5)
HEMATOCRIT: 23.2 % — AB (ref 39.0–52.0)
Hemoglobin: 7.3 g/dL — ABNORMAL LOW (ref 13.0–17.0)
LYMPHS PCT: 7 % — AB (ref 12–46)
Lymphs Abs: 1 10*3/uL (ref 0.7–4.0)
MCH: 28.1 pg (ref 26.0–34.0)
MCHC: 31.5 g/dL (ref 30.0–36.0)
MCV: 89.2 fL (ref 78.0–100.0)
METAMYELOCYTES PCT: 0 %
MONO ABS: 0.6 10*3/uL (ref 0.1–1.0)
MONOS PCT: 4 % (ref 3–12)
Myelocytes: 0 %
Neutro Abs: 12.9 10*3/uL — ABNORMAL HIGH (ref 1.7–7.7)
Neutrophils Relative %: 87 % — ABNORMAL HIGH (ref 43–77)
PLATELETS: 467 10*3/uL — AB (ref 150–400)
PROMYELOCYTES ABS: 0 %
RBC: 2.6 MIL/uL — ABNORMAL LOW (ref 4.22–5.81)
RDW: 16.2 % — ABNORMAL HIGH (ref 11.5–15.5)
WBC: 14.6 10*3/uL — AB (ref 4.0–10.5)
nRBC: 0 /100 WBC

## 2014-01-03 LAB — BASIC METABOLIC PANEL
BUN: 43 mg/dL — ABNORMAL HIGH (ref 6–23)
CO2: 21 meq/L (ref 19–32)
Calcium: 8.3 mg/dL — ABNORMAL LOW (ref 8.4–10.5)
Chloride: 104 mEq/L (ref 96–112)
Creatinine, Ser: 2 mg/dL — ABNORMAL HIGH (ref 0.50–1.35)
GFR calc Af Amer: 37 mL/min — ABNORMAL LOW (ref >90)
GFR calc non Af Amer: 32 mL/min — ABNORMAL LOW (ref >90)
Glucose, Bld: 112 mg/dL — ABNORMAL HIGH (ref 70–99)
POTASSIUM: 5.2 meq/L (ref 3.7–5.3)
Sodium: 140 mEq/L (ref 137–147)

## 2014-01-03 LAB — COMPREHENSIVE METABOLIC PANEL
ALBUMIN: 2.1 g/dL — AB (ref 3.4–5.0)
AST: 73 U/L — AB (ref 15–37)
Alkaline Phosphatase: 373 U/L — ABNORMAL HIGH
Anion Gap: 7 (ref 7–16)
BUN: 40 mg/dL — ABNORMAL HIGH (ref 7–18)
Bilirubin,Total: 0.4 mg/dL (ref 0.2–1.0)
CALCIUM: 8.3 mg/dL — AB (ref 8.5–10.1)
Chloride: 110 mmol/L — ABNORMAL HIGH (ref 98–107)
Co2: 22 mmol/L (ref 21–32)
Creatinine: 1.92 mg/dL — ABNORMAL HIGH (ref 0.60–1.30)
EGFR (African American): 40 — ABNORMAL LOW
GFR CALC NON AF AMER: 35 — AB
Glucose: 95 mg/dL (ref 65–99)
Osmolality: 287 (ref 275–301)
POTASSIUM: 4.9 mmol/L (ref 3.5–5.1)
SGPT (ALT): 54 U/L (ref 12–78)
Sodium: 139 mmol/L (ref 136–145)
Total Protein: 7.4 g/dL (ref 6.4–8.2)

## 2014-01-03 LAB — CBC
HCT: 24.7 % — ABNORMAL LOW (ref 40.0–52.0)
HGB: 7.9 g/dL — AB (ref 13.0–18.0)
MCH: 27.5 pg (ref 26.0–34.0)
MCHC: 31.8 g/dL — ABNORMAL LOW (ref 32.0–36.0)
MCV: 86 fL (ref 80–100)
Platelet: 464 10*3/uL — ABNORMAL HIGH (ref 150–440)
RBC: 2.86 10*6/uL — ABNORMAL LOW (ref 4.40–5.90)
RDW: 16.2 % — ABNORMAL HIGH (ref 11.5–14.5)
WBC: 14.6 10*3/uL — ABNORMAL HIGH (ref 3.8–10.6)

## 2014-01-03 LAB — URINALYSIS, COMPLETE
BACTERIA: NONE SEEN
RBC,UR: 70397 /HPF (ref 0–5)
SQUAMOUS EPITHELIAL: NONE SEEN
Specific Gravity: 1.021 (ref 1.003–1.030)
WBC UR: 888 /HPF (ref 0–5)

## 2014-01-03 NOTE — ED Provider Notes (Signed)
CSN: [id number]     Arrival date & time 01/03/14  2243 History  This chart was scribed for Michaila Kenney Alfonso Patten, MD by Eston Mould, ED Scribe. This patient was seen in room MH01/MH01 and the patient's care was started at 11:14 PM.   Chief Complaint  Patient presents with  . Hematuria   Patient is a 68 y.o. male presenting with hematuria. The history is provided by the patient. No language interpreter was used.  Hematuria This is a recurrent problem. The current episode started yesterday. The problem occurs constantly. The problem has not changed since onset.Pertinent negatives include no abdominal pain. Nothing aggravates the symptoms. Nothing relieves the symptoms. He has tried nothing for the symptoms. The treatment provided no relief.   HPI Comments: Cody Barry is a 68 y.o. male with a hx of bladder cancer who presents to the Emergency Department complaining of ongoing hematuria that began yesterday. Pt has had hematuria previously. His wife states pt has had episodes of hematuria without her knowledge. She states pt was seen in the ED and had catherization placed that eventually placed him in the OR due to kidney failure. His wife states pts PCP was mentioning potential dialysis. Pts L side of urine has had an increased amount of blood. Pts wife states his urologist, Dr. Eliberto Ivory with [facility], never showed up or answered his phone and now she states he does not have a urologist at this time.   Pt is currently between cycles of chemo. Pts wife states he is having 3 month break periods due to having an episode of loss of sensation to hands and other sx last year.   Past Medical History  Diagnosis Date  . ED (erectile dysfunction)   . BPH (benign prostatic hypertrophy)   . COPD (chronic obstructive pulmonary disease)   . Hyperlipidemia   . Bladder cancer 2014   Past Surgical History  Procedure Laterality Date  . Umbilical hernia repair    . Transurethral resection of  bladder tumor  1/14    Dr Yves Dill   Family History  Problem Relation Age of Onset  . Hypertension Mother   . Coronary artery disease Neg Hx   . Diabetes Neg Hx   . Cancer Neg Hx    History  Substance Use Topics  . Smoking status: Former Smoker -- 1.00 packs/day for 45 years    Types: Cigarettes    Quit date: 09/20/2006  . Smokeless tobacco: Never Used  . Alcohol Use: No     Comment: rare    Review of Systems  Constitutional: Negative for fever.  Gastrointestinal: Negative for abdominal pain.  Genitourinary: Positive for hematuria. Negative for dysuria, frequency, decreased urine volume, discharge, penile swelling, difficulty urinating and testicular pain.  All other systems reviewed and are negative.  Allergies  Penicillins  Home Medications   Prior to Admission medications   Medication Sig Start Date End Date Taking? Authorizing Provider  amiodarone (PACERONE) 200 MG tablet TAKE 1 TABLET BY MOUTH DAILY. 10/18/13   Minna Merritts, MD  oxyCODONE-acetaminophen (PERCOCET/ROXICET) 5-325 MG per tablet  12/27/13   Historical Provider, MD  zolpidem (AMBIEN) 10 MG tablet  12/27/13   Historical Provider, MD   Triage Vitals:BP 122/59  Pulse 85  Temp(Src) 97.8 F (36.6 C) (Oral)  Resp 16  Ht 5\' 11"  (1.803 m)  Wt 180 lb (81.647 kg)  BMI 25.12 kg/m2  SpO2 97%  Physical Exam  Nursing note and vitals reviewed. Constitutional: He is oriented to  person, place, and time. He appears well-developed and well-nourished. No distress.  HENT:  Head: Normocephalic and atraumatic.  Mouth/Throat: Oropharynx is clear and moist. No oropharyngeal exudate.  Eyes: EOM are normal. Pupils are equal, round, and reactive to light.  Neck: Normal range of motion. Neck supple. No tracheal deviation present.  Cardiovascular: Normal rate, regular rhythm, normal heart sounds and intact distal pulses.   Pulmonary/Chest: Effort normal and breath sounds normal. No respiratory distress. He has no wheezes. He has  no rales. He exhibits no tenderness.  No crepitance of step offs nor point tenderness of the c t or l spine  Abdominal: Soft. Bowel sounds are normal. There is no tenderness. There is no rebound and no guarding.  Genitourinary:  Conduit on the L has yellow . Oxidized blood to the conduit and  R bag, moderate.  Musculoskeletal: Normal range of motion. He exhibits no edema and no tenderness.  No step-offs.  Neurological: He is alert and oriented to person, place, and time. He has normal reflexes.  Skin: Skin is warm and dry.  Nephrostomy tube Site to the L is clean, dry and intact. Nephrostomy Site to the R is clean, dry and intact. No lesions to the skin. Port is clean, dry and intact.   Psychiatric: He has a normal mood and affect. His behavior is normal.   ED Course  Procedures DIAGNOSTIC STUDIES: Oxygen Saturation is 97% on RA, normal by my interpretation.    COORDINATION OF CARE: 11:23 PM-Discussed treatment plan which includes multiple UA's, labs and X-rays. Pt agreed to plan.   Labs Review Labs Reviewed  CBC WITH DIFFERENTIAL - Abnormal; Notable for the following:    WBC 14.6 (*)    RBC 2.60 (*)    Hemoglobin 7.3 (*)    HCT 23.2 (*)    RDW 16.2 (*)    Platelets 467 (*)    All other components within normal limits  URINALYSIS, ROUTINE W REFLEX MICROSCOPIC  BASIC METABOLIC PANEL   Imaging Review No results found.   EKG Interpretation None     MDM   Final diagnoses:  None    Medications  sodium chloride 0.9 % bolus 500 mL (not administered)  0.9 %  sodium chloride infusion (not administered)  cefTRIAXone (ROCEPHIN) 1 G injection (not administered)  cefTRIAXone (ROCEPHIN) 1 g in dextrose 5 % 50 mL IVPB (1 g Intravenous New Bag/Given 01/04/14 0110)    MDM Reviewed: nursing note and vitals Reviewed previous: x-ray Interpretation: labs, x-ray and CT scan (anemia elevated WBS count UTI elevated creatinine) Consults: admitting MD    I personally performed the  services described in this documentation, which was scribed in my presence. The recorded information has been reviewed and is accurate.     Carlisle Beers, MD 01/04/14 [id number]

## 2014-01-03 NOTE — ED Notes (Signed)
Blood in urostomy tube x 2 days  Unable to irrigate till clear

## 2014-01-03 NOTE — ED Notes (Signed)
Pt in from home c/o hematuria and bleeding into tubes from his kidneys, states these were placed two weeks ago in the hospital, since being home they have noted blood in the tubes draining the urine over the last week, last night patient noted blood in his urine, denies new pain with this, states they have not seen a urologist since discharge, pt alert and oriented, no distress noted

## 2014-01-03 NOTE — ED Notes (Signed)
Pt c/o bloody urine, pt recently admitted with urostomy tubes placed x 2

## 2014-01-03 NOTE — Telephone Encounter (Signed)
Vinnie Level nurse with [facility] is at pts home; rt urostomy has grossly bloody urine for over 24 hours. Pt has no pain, burning or fever. BP 125/45 P 82. Pt does not have urologist; Vinnie Level said urostomy was put in at [facility] ED. Dr Damita Dunnings advised pt should go to ED now; Vinnie Level voiced understanding and will convey to pt and pts family.

## 2014-01-04 ENCOUNTER — Encounter (HOSPITAL_BASED_OUTPATIENT_CLINIC_OR_DEPARTMENT_OTHER): Payer: Self-pay | Admitting: Emergency Medicine

## 2014-01-04 LAB — URINALYSIS, ROUTINE W REFLEX MICROSCOPIC
BILIRUBIN URINE: NEGATIVE
GLUCOSE, UA: NEGATIVE mg/dL
Glucose, UA: NEGATIVE mg/dL
Glucose, UA: NEGATIVE mg/dL
Hgb urine dipstick: NEGATIVE
KETONES UR: 40 mg/dL — AB
Ketones, ur: 40 mg/dL — AB
Ketones, ur: NEGATIVE mg/dL
NITRITE: NEGATIVE
NITRITE: NEGATIVE
Nitrite: NEGATIVE
PH: 5 (ref 5.0–8.0)
Protein, ur: >300 mg/dL — AB
Protein, ur: >300 mg/dL — AB
SPECIFIC GRAVITY, URINE: 1.03 (ref 1.005–1.030)
Specific Gravity, Urine: 1.03 (ref 1.005–1.030)
Specific Gravity, Urine: 1.03 (ref 1.005–1.030)
UROBILINOGEN UA: 0.2 mg/dL (ref 0.0–1.0)
UROBILINOGEN UA: 0.2 mg/dL (ref 0.0–1.0)
Urobilinogen, UA: 0.2 mg/dL (ref 0.0–1.0)
pH: 5 (ref 5.0–8.0)
pH: 5 (ref 5.0–8.0)

## 2014-01-04 LAB — URINE MICROSCOPIC-ADD ON

## 2014-01-04 MED ORDER — DEXTROSE 5 % IV SOLN
1.0000 g | Freq: Once | INTRAVENOUS | Status: AC
  Administered 2014-01-04: 1 g via INTRAVENOUS

## 2014-01-04 MED ORDER — SODIUM CHLORIDE 0.9 % IV SOLN: Freq: Once | INTRAVENOUS | Status: DC

## 2014-01-04 MED ORDER — SODIUM CHLORIDE 0.9 % IV BOLUS (SEPSIS): 500.0000 mL | Freq: Once | INTRAVENOUS | Status: DC

## 2014-01-04 MED ORDER — CEFTRIAXONE SODIUM 1 G IJ SOLR
INTRAMUSCULAR | Status: AC
  Filled 2014-01-04: qty 10

## 2014-01-04 NOTE — ED Notes (Signed)
Called [facility] for [employer]

## 2014-01-04 NOTE — ED Notes (Signed)
Pt transported w port acesssed

## 2014-01-05 ENCOUNTER — Inpatient Hospital Stay: Payer: Self-pay | Admitting: Internal Medicine

## 2014-01-05 LAB — URINE CULTURE
Colony Count: >=100000
SPECIAL REQUESTS: NORMAL
SPECIAL REQUESTS: NORMAL
Special Requests: NORMAL

## 2014-01-05 LAB — BASIC METABOLIC PANEL
Anion Gap: 8 (ref 7–16)
BUN: 34 mg/dL — AB (ref 7–18)
CALCIUM: 7.2 mg/dL — AB (ref 8.5–10.1)
CHLORIDE: 109 mmol/L — AB (ref 98–107)
Co2: 21 mmol/L (ref 21–32)
Creatinine: 1.68 mg/dL — ABNORMAL HIGH (ref 0.60–1.30)
GFR CALC AF AMER: 47 — AB
GFR CALC NON AF AMER: 41 — AB
Glucose: 68 mg/dL (ref 65–99)
Osmolality: 282 (ref 275–301)
Potassium: 5 mmol/L (ref 3.5–5.1)
Sodium: 138 mmol/L (ref 136–145)

## 2014-01-05 LAB — CBC WITH DIFFERENTIAL/PLATELET
BASOS PCT: 0.4 %
Basophil #: 0.1 10*3/uL (ref 0.0–0.1)
EOS ABS: 0 10*3/uL (ref 0.0–0.7)
Eosinophil %: 0.2 %
HCT: 24.5 % — AB (ref 40.0–52.0)
HGB: 7.7 g/dL — ABNORMAL LOW (ref 13.0–18.0)
Lymphocyte #: 0.7 10*3/uL — ABNORMAL LOW (ref 1.0–3.6)
Lymphocyte %: 4.1 %
MCH: 27.6 pg (ref 26.0–34.0)
MCHC: 31.4 g/dL — ABNORMAL LOW (ref 32.0–36.0)
MCV: 88 fL (ref 80–100)
Monocyte #: 1.5 x10 3/mm — ABNORMAL HIGH (ref 0.2–1.0)
Monocyte %: 9.1 %
NEUTROS ABS: 14.4 10*3/uL — AB (ref 1.4–6.5)
Neutrophil %: 86.2 %
Platelet: 374 10*3/uL (ref 150–440)
RBC: 2.79 10*6/uL — AB (ref 4.40–5.90)
RDW: 16 % — ABNORMAL HIGH (ref 11.5–14.5)
WBC: 16.7 10*3/uL — ABNORMAL HIGH (ref 3.8–10.6)

## 2014-01-06 LAB — CBC WITH DIFFERENTIAL/PLATELET
BASOS PCT: 0.2 %
Basophil #: 0 10*3/uL (ref 0.0–0.1)
EOS PCT: 0.4 %
Eosinophil #: 0.1 10*3/uL (ref 0.0–0.7)
HCT: 24.9 % — ABNORMAL LOW (ref 40.0–52.0)
HGB: 7.7 g/dL — AB (ref 13.0–18.0)
LYMPHS ABS: 0.7 10*3/uL — AB (ref 1.0–3.6)
LYMPHS PCT: 4.6 %
MCH: 27.1 pg (ref 26.0–34.0)
MCHC: 30.8 g/dL — AB (ref 32.0–36.0)
MCV: 88 fL (ref 80–100)
MONOS PCT: 8.6 %
Monocyte #: 1.2 x10 3/mm — ABNORMAL HIGH (ref 0.2–1.0)
Neutrophil #: 12.2 10*3/uL — ABNORMAL HIGH (ref 1.4–6.5)
Neutrophil %: 86.2 %
Platelet: 377 10*3/uL (ref 150–440)
RBC: 2.82 10*6/uL — ABNORMAL LOW (ref 4.40–5.90)
RDW: 16.5 % — AB (ref 11.5–14.5)
WBC: 14.2 10*3/uL — ABNORMAL HIGH (ref 3.8–10.6)

## 2014-01-07 LAB — CBC WITH DIFFERENTIAL/PLATELET
BASOS PCT: 0.3 %
Basophil #: 0 10*3/uL (ref 0.0–0.1)
EOS PCT: 0.7 %
Eosinophil #: 0.1 10*3/uL (ref 0.0–0.7)
HCT: 26.2 % — ABNORMAL LOW (ref 40.0–52.0)
HGB: 8.3 g/dL — AB (ref 13.0–18.0)
Lymphocyte #: 0.7 10*3/uL — ABNORMAL LOW (ref 1.0–3.6)
Lymphocyte %: 6.1 %
MCH: 27.8 pg (ref 26.0–34.0)
MCHC: 31.8 g/dL — ABNORMAL LOW (ref 32.0–36.0)
MCV: 87 fL (ref 80–100)
MONO ABS: 1.3 x10 3/mm — AB (ref 0.2–1.0)
MONOS PCT: 10.3 %
Neutrophil #: 10.1 10*3/uL — ABNORMAL HIGH (ref 1.4–6.5)
Neutrophil %: 82.6 %
PLATELETS: 333 10*3/uL (ref 150–440)
RBC: 3 10*6/uL — AB (ref 4.40–5.90)
RDW: 16.5 % — ABNORMAL HIGH (ref 11.5–14.5)
WBC: 12.2 10*3/uL — ABNORMAL HIGH (ref 3.8–10.6)

## 2014-01-08 LAB — CBC WITH DIFFERENTIAL/PLATELET
Basophil #: 0.1 10*3/uL (ref 0.0–0.1)
Basophil %: 0.4 %
EOS ABS: 0 10*3/uL (ref 0.0–0.7)
EOS PCT: 0.3 %
HCT: 26.2 % — ABNORMAL LOW (ref 40.0–52.0)
HGB: 8.1 g/dL — AB (ref 13.0–18.0)
Lymphocyte #: 0.7 10*3/uL — ABNORMAL LOW (ref 1.0–3.6)
Lymphocyte %: 5.1 %
MCH: 27.8 pg (ref 26.0–34.0)
MCHC: 30.9 g/dL — ABNORMAL LOW (ref 32.0–36.0)
MCV: 90 fL (ref 80–100)
Monocyte #: 1.3 x10 3/mm — ABNORMAL HIGH (ref 0.2–1.0)
Monocyte %: 9 %
Neutrophil #: 12.4 10*3/uL — ABNORMAL HIGH (ref 1.4–6.5)
Neutrophil %: 85.2 %
Platelet: 317 10*3/uL (ref 150–440)
RBC: 2.91 10*6/uL — ABNORMAL LOW (ref 4.40–5.90)
RDW: 16.5 % — ABNORMAL HIGH (ref 11.5–14.5)
WBC: 14.5 10*3/uL — ABNORMAL HIGH (ref 3.8–10.6)

## 2014-01-10 LAB — CBC WITH DIFFERENTIAL/PLATELET
Basophil #: 0 10*3/uL (ref 0.0–0.1)
Basophil %: 0.2 %
Eosinophil #: 0.1 10*3/uL (ref 0.0–0.7)
Eosinophil %: 0.6 %
HCT: 25.2 % — ABNORMAL LOW (ref 40.0–52.0)
HGB: 7.9 g/dL — AB (ref 13.0–18.0)
LYMPHS PCT: 5.7 %
Lymphocyte #: 0.9 10*3/uL — ABNORMAL LOW (ref 1.0–3.6)
MCH: 28.1 pg (ref 26.0–34.0)
MCHC: 31.3 g/dL — AB (ref 32.0–36.0)
MCV: 90 fL (ref 80–100)
MONO ABS: 1.7 x10 3/mm — AB (ref 0.2–1.0)
Monocyte %: 10.5 %
Neutrophil #: 13.4 10*3/uL — ABNORMAL HIGH (ref 1.4–6.5)
Neutrophil %: 83 %
Platelet: 311 10*3/uL (ref 150–440)
RBC: 2.8 10*6/uL — ABNORMAL LOW (ref 4.40–5.90)
RDW: 16.7 % — ABNORMAL HIGH (ref 11.5–14.5)
WBC: 16.2 10*3/uL — ABNORMAL HIGH (ref 3.8–10.6)

## 2014-01-10 LAB — BASIC METABOLIC PANEL
ANION GAP: 5 — AB (ref 7–16)
BUN: 31 mg/dL — AB (ref 7–18)
Calcium, Total: 7.7 mg/dL — ABNORMAL LOW (ref 8.5–10.1)
Chloride: 108 mmol/L — ABNORMAL HIGH (ref 98–107)
Co2: 23 mmol/L (ref 21–32)
Creatinine: 1.58 mg/dL — ABNORMAL HIGH (ref 0.60–1.30)
EGFR (African American): 51 — ABNORMAL LOW
EGFR (Non-African Amer.): 44 — ABNORMAL LOW
GLUCOSE: 82 mg/dL (ref 65–99)
Osmolality: 278 (ref 275–301)
Potassium: 4.7 mmol/L (ref 3.5–5.1)
Sodium: 136 mmol/L (ref 136–145)

## 2014-01-11 ENCOUNTER — Telehealth: Payer: Self-pay

## 2014-01-11 NOTE — Telephone Encounter (Signed)
Long conversation with wife Quite an ordeal---to [facility] ER for bleeding then to [facility] due to wait. Finally to [facility] urgent care ER---diagnosed with UTI. Rx started then transferred to [facility]. Unhappy with care by Dr Andrey Campanile seen by [facility] again. Needed blood transfusions due to the blood loss Wife took him yesterday to [facility]--- Dr Marcello Moores (urologic oncology). Coordinated with Dr Baruch Gouty here---will try RT again to see if they can stop the bleeding that has been going on  Told her to let me know when that Rx is done---I will step back in. May be ready for hospice care at that time

## 2014-01-11 NOTE — Telephone Encounter (Signed)
Mrs Panepinto said pt was discharged from [facility] on 01/10/14 due to blood in urine; Mrs Enriques is returning Dr Alla German call and Mrs Manuele said [facility] wanted Dr Silvio Pate to set up hospice care for pt. Pt has f/u appt with Dr Silvio Pate on 01/18/14. I faxed request to [facility] for med records for hospitalization.

## 2014-01-15 ENCOUNTER — Telehealth: Payer: Self-pay

## 2014-01-16 ENCOUNTER — Telehealth: Payer: Self-pay

## 2014-01-16 NOTE — Telephone Encounter (Signed)
Called to pass on condolences Left message Will call back later

## 2014-01-16 NOTE — Telephone Encounter (Signed)
Cody Barry with [facility] said pt expired on Feb 12, 2014 at 4:42 pm.

## 2014-01-18 ENCOUNTER — Ambulatory Visit: Payer: Medicare Other | Admitting: Internal Medicine

## 2014-01-18 ENCOUNTER — Ambulatory Visit: Payer: Self-pay | Admitting: Oncology

## 2014-01-18 ENCOUNTER — Ambulatory Visit: Payer: Self-pay | Admitting: Internal Medicine

## 2014-01-18 NOTE — Telephone Encounter (Signed)
Spoke with Helene Kelp.  She said that Dr. Grayland Ormond is the attending but she wanted to notify Dr. Silvio Pate of pt's admission per his wife's request.

## 2014-01-18 NOTE — Telephone Encounter (Signed)
okay

## 2014-01-18 NOTE — Telephone Encounter (Signed)
Cody Barry with [facility] left v/m wanting Dr Silvio Pate to know that pt was admitted to hospice services on 01/13/14.

## 2014-01-18 NOTE — Telephone Encounter (Signed)
Let her know that I was expecting that but he was considering more radiation or even other treatment  I am fine with being the attending of record.

## 2014-01-18 DEATH — deceased

## 2014-03-20 NOTE — Telephone Encounter (Signed)
This encounter was created in error - please disregard.

## 2014-04-01 ENCOUNTER — Encounter: Payer: Medicare Other | Admitting: Internal Medicine

## 2014-11-11 ENCOUNTER — Encounter: Payer: Medicare Other | Admitting: Internal Medicine

## 2015-01-10 NOTE — Consult Note (Signed)
   Comments   Dr Phifer and I met with pt's wife. She gives history of acute confusion and hallucinations starting about 1.5 weeks prior to admission. In fact, she cites the worsening confusion as the primary reason for bringing patient to the hospital. Patient had been taking as needed percocet (left over from a previous sgy) starting about the time of the onset of confusion. Likely this may have been a precipitating cause. However wife agrees with managing his pain. So will try low dose hydrocodone. She is also in agreement with scheduled antipyschotics for agitation.   Pt does not have a living will. Wife says that pt recently lost a friend of cancer who died on a ventilator. Both she and pt discussed that event and did not think him being on a ventilator was the right decision given his friend's cancer. However wife is hesitant to make any decisions and hopes that if pt's mental status clears we can have a conversation with him about his wishes. Pt to remain a full code for now.     Electronic Signatures for Addendum Section:  Phifer, Izora Gala (MD) (Signed Addendum 05-Aug-14 13:25)  Billey Chang, NP, and I met with pt's wife. Discussed tx plan in detail. Agree with assessment and plan as outlined in above note.   Electronic Signatures: Cynthia Cogle, Kirt Boys (NP)  (Signed 05-Aug-14 12:23)  Authored: Palliative Care   Last Updated: 05-Aug-14 13:25 by Phifer, Izora Gala (MD)

## 2015-01-10 NOTE — H&P (Signed)
PATIENT NAME:  Cody Barry, Cody Barry MR#:  [id number] DATE OF BIRTH:  [birth year]  DATE OF ADMISSION:  04/22/2013  PRIMARY CARE PHYSICIAN: Dr. Viviana Simpler.   CHIEF COMPLAINT: Status post fall and hip pain.   HISTORY OF PRESENT ILLNESS: This is a 69 year old male who presents to the hospital after suffering a fall last night and having significant hip pain. The patient has a history of bladder cancer with bony mets. He was scheduled to start some radiation therapy in the next coming week. Last night when he attempted to get and go to the bathroom, he fell. As per the wife the patient has also been hallucinating a little bit since he has been started on some Percocet for pain. The patient apparently was seeing things in the room and seeing people in the room when they were not there. Last night he said when he attempted to get up he noticed that people were trying to lift off been getting up, but they were not there. The patient eventually was able to get himself up back into the bedroom and back asleep. This morning when he woke up, he was still having significant hip pain and therefore was brought to the ER. In the Emergency Room, the patient was noted to have a right-sided pubic ramus fracture and also noted to be new onset atrial fibrillation. Hospitalist services were contacted for treatment and elevation. The patient presently denies any palpitations, any shortness of breath, any chest pain, any syncope or any other associated symptoms presently.   REVIEW OF SYSTEMS:  CONSTITUTIONAL: No documented fever. No weight gain or weight loss.  EYES: No blurred or double vision.  ENT: No tinnitus. No postnasal drip. No redness of the oropharynx.  RESPIRATORY: No cough, no wheeze, no hemoptysis, no dyspnea.  CARDIOVASCULAR: No chest pain, no orthopnea, no palpitations or syncope.  GASTROINTESTINAL: No nausea, vomiting, diarrhea, no abdominal pain, no melena or hematochezia.  GENITOURINARY: No dysuria, no  hematuria.  ENDOCRINE: No polyuria or nocturia. Heat or cold intolerance.  HEMATOLOGIC: No anemia, no bruising, no bleeding.  INTEGUMENTARY: No rashes. No lesions.  MUSCULOSKELETAL: No arthritis, no swelling, no gout.  NEUROLOGIC: No numbness. No tingling. No ataxia. No seizure-type activity.   PSYCHIATRIC: No anxiety, no insomnia. No ADD.   PAST MEDICAL HISTORY: Consistent with bladder cancer, chronic kidney disease stage III.   ALLERGIES: TO PENICILLIN.   SOCIAL HISTORY: No smoking. No alcohol abuse. No illicit drug abuse. Lives at home with his wife.   FAMILY HISTORY: Father died from prostate cancer. Mother died from complications of kidney and liver failure.   CURRENT MEDICATIONS: Percocet 1 tab q. 4-6 hours as needed for pain and prednisone 10 mg daily.   PHYSICAL EXAMINATION: On admission is as follows:   VITAL SIGNS:  Temperature is 97.6, pulse 115, respirations 20, blood pressure 137/60, sats 98% on room air.  GENERAL: He is a pleasant-appearing male, no apparent distress.  HEENT: Atraumatic, normocephalic. Extraocular muscles are intact. Pupils are equal and reactive to light. Sclerae anicteric. No conjunctival injection. No pharyngeal erythema.  NECK: Supple. There is no jugular venous distention, no bruits, no lymphadenopathy, no thyromegaly.  HEART: Irregular. No murmurs, rubs or clicks.  LUNGS: Clear to auscultation bilaterally. No rales, no rhonchi, no wheezes.  ABDOMEN: Soft, flat, nontender, nondistended. Has good bowel sounds. No hepatosplenomegaly appreciated.  EXTREMITIES: No evidence of any cyanosis, clubbing, or peripheral edema. Has +2 pedal and radial pulses bilaterally.  NEUROLOGICAL: The patient is alert, awake, and  oriented x 3. No other focal motor or sensory deficits appreciated bilaterally.  SKIN: Moist and warm with no rashes appreciated.  LYMPHATIC: There is no cervical or axillary lymphadenopathy.   LABORATORY, DIAGNOSTIC AND RADIOLOGIC DATA:  Serum  glucose of 106, BUN 50, creatinine 2.06, sodium 140, potassium 4.8, chloride 110, bicarbonate 23. The patient's AST 46, ALT is 103, albumin 2.6, CK 31. Troponin is still pending. CK-MB less than 0.5. White cell count 6.1, hemoglobin 11.1, hematocrit 32.8, platelet count 196. INR is one. Urinalysis shows 1+ leukocyte esterase with 22 white cells, no bacteria.   The patient did have a CT of the head done without contrast, which showed chronic involutional changes without evidence of acute abnormalities.   The patient had a CT of the pelvis without contrast which shows cortical disruption involving the expansile lytic lesion in the ischial tuberosity on the right consistent with a pathologic fracture.   The patient also had an x-ray of the chest which showed no acute cardiopulmonary disease.   X-ray of the pelvis showing pathologic fracture of the inferior pubic ramus on the right.   ASSESSMENT AND PLAN: This is a 69 year old male with history of bladder cancer with bone mets, chronic kidney disease stage III, presents in the hospital after a fall and having significant hip and pelvic pain. The patient noted to have a pathologic right pubic rami fracture and also noted to be in new-onset atrial fibrillation.  1.  New-onset atrial fibrillation. This is an incidental finding as the patient is clinically asymptomatic. The patient has no chest pain, no palpitations. No shortness of breath. The patient received one dose of IV metoprolol in the ER and rate seems to have improved from the 130s to the low 100s. I will start the patient on some oral metoprolol for now, we will follow serial cardiac markers, get a 2-dimensional echocardiogram get a cardiology consult. I will defer anticoagulation as per cardiology for now. Since the patient is a high fall risk, I would avoid anticoagulation at this point and just considered daily, aspirin for now.  2.  Status post fall and pubic ramus fracture. This is a pathologic  fracture given his history of bladder cancer with METs. For now, we will continue as needed pain control with Percocet, get a physical therapy consult to assess his mobility status. The patient may benefit from radiation therapy because of the bone mets.  3.  Bladder cancer with bony mets. I will get oncology consult. The patient is followed by Dr. Grayland Ormond. The patient's urologist is Dr. Maryan Puls. I will await for the oncology input to see if he would benefit from radiation treatment given his bony mets. The patient apparently was planning on starting radiation in the next 1 to 2 weeks. We can probably start some treatment while he is in the hospital this time.  4. Chronic kidney disease stage III. The patient's creatinine currently is at baseline. There is no acute issue related to this at this moment. The patient likely would benefit from outpatient nephrology follow-up.   CODE STATUS: The patient is a full code.   TIME SPENT: 50 minutes    ____________________________ Belia Heman. Verdell Carmine, MD vjs:cc D: 04/22/2013 18:07:14 ET T: 04/22/2013 20:03:33 ET JOB#: [id number]  cc: Belia Heman. Verdell Carmine, MD, <Dictator> Henreitta Leber MD ELECTRONICALLY SIGNED 04/23/2013 14:48

## 2015-01-10 NOTE — Consult Note (Signed)
Brief Consult Note: Diagnosis: Right hip ischium fracture through metastatic lesion.   Patient was seen by consultant.   Consult note dictated.   Comments: No operative intervention needed.  Can have PT evaluation when patient's confusion resolves.  Electronic Signatures: Thornton Park (MD)  (Signed 05-Aug-14 18:05)  Authored: Brief Consult Note   Last Updated: 05-Aug-14 18:05 by Thornton Park (MD)

## 2015-01-10 NOTE — Consult Note (Signed)
Reason for Visit: This 69 year old Male patient presents to the clinic for initial evaluation of  bone metastases to right hip from bladder cancer .   Referred by Dr. Grayland Ormond.  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 Back in January 2014 he had TUR B again for muscle invasive bladder cancer.he has been complaining of right hip pain and difficulty ambulating PET CT scan was performed showing lytic destruction of the right acetabulum. I have reviewed the case with Dr. Grayland Ormond and he is asked me to evaluate the patient for possibility of palliative treatment. He has been startedon Zometa is also receiving gemcitabine and Taxol. Plain films show nondisplaced inferior pubic ramus involvement of the right.  Past Hx:    Hard of Hearing:    kidney stones:    anemia:    BPH:    bladder cancer:    Laser vaporization of the prostate: 2012   Heernia repair with mesh:    cysto x 2:   Past, Family and Social History:  Past Medical History positive   Genitourinary kidney stones   Past Surgical History herniorrhaphy repair multiple bladder surgeries as well as prostate surgery   Family History positive   Family History Comments family history positive for prostate cancer   Social History noncontributory   Additional Past Medical and Surgical History accompanied by his wife today.   Allergies:   Penicillin: Rash, Swelling  Home Meds:  Home Medications: Medication Instructions Status  predniSONE 10 mg  oral tablet 1 tab(s) orally once a day Active  Flexeril 10 mg oral tablet 1 tab(s)  3 times a day as needed  FOR MUSCLE SPASM Active  Percocet 5/325 oral tablet 1-2 tab(s) PO every 4 hours as needed  FOR PAIN Active  acetaminophen-oxycodone 325 mg-10 mg oral tablet 1 tab(s) orally 1 to 2 times a day, As Needed Active  ibuprofen 600 mg oral tablet 1 tab(s) orally 4 times a day, As Needed - for Pain Active   Review of Systems:  General negative   Performance Status (ECOG) 0   Skin negative   Breast negative   Ophthalmologic negative   ENMT negative   Respiratory and Thorax negative   Cardiovascular negative   Gastrointestinal negative   Genitourinary see HPI   Musculoskeletal negative   Neurological negative   Psychiatric negative   Hematology/Lymphatics negative   Endocrine negative   Allergic/Immunologic negative   Nursing Notes:  Nursing Vital Signs and Chemo Nursing Nursing Notes: *CC Vital Signs Flowsheet:   29-Jul-14 09:25  Temp Temperature 97.1  Pulse Pulse 87  Respirations Respirations 20  SBP SBP 157  DBP DBP 69  Pain Scale (0-10)  5  Current Weight (kg) (kg) 89.4    15:15  Vital Signs Type Vital Signs Type Post Chemo  Pulse Pulse 76  Respirations Respirations 20  SBP SBP 128  DBP DBP 75   Physical Exam:  General/Skin/HEENT:  General normal   Skin normal   Eyes normal   ENMT normal   Head and Neck normal  Additional PE well-developed elderly gentleman in NAD. Lungs are clear to A&P cardiac examination shows regular rate and rhythm. He has a Port-A-Cath placed in his left anterior chest. Range of motion of right lower extremities doesn't elicit some pain. No pain is elicited on deep palpation of his lumbosacral region. No motor or sensory level is noted in either extremity.   Breasts/Resp/CV/GI/GU:  Respiratory and Thorax normal   Cardiovascular normal   Gastrointestinal normal   Genitourinary normal   MS/Neuro/Psych/Lymph:   Musculoskeletal normal   Neurological normal   Lymphatics normal   Other Results:  Radiology Results: XRay:    26-Jul-14 21:09, Femur Right  Femur Right   REASON FOR EXAM:    pain after injury  -  ed subwait  COMMENTS:   May transport without cardiac monitor    PROCEDURE: DXR - DXR FEMUR RIGHT  - Apr 14 2013  9:09PM     RESULT: There is no evidence of fracture, dislocation, or malalignment.    IMPRESSION:     1. No evidence of acute abnormalities.   2. If there are persistent complaints of pain or persistent clinical   concern, a repeat evaluation in 7-10 days is recommended if clinically   warranted.     Thank you for the opportunity to contributeto the care of your patient.    Verified By: Mikki Santee, M.D., MD  LabUnknown:    22-Jul-14 10:29, PET/CT Restaging Bladder  PACS Image     26-Jul-14 21:09, Femur Right  PACS Image     26-Jul-14 23:10, CT Pelvis Without Contrast  PACS Image   CT:  CT Pelvis Without Contrast   REASON FOR EXAM:    RIGHT HIP PAIN, IRREG ON XRAY AT RIGHT PUBIC RAMI, INF  COMMENTS:       PROCEDURE: CT  - CT PELVIS STANDARD WO  - Apr 14 2013 11:10PM     RESULT:     Technique: Multiplanar imaging of the pelvis was obtained utilizing   helical 3 mm acquisition and bone reconstruction algorithm.    Findings: Lytic lesions are appreciated involving the ischial tuberosity   on the right as well as the posterior medial wall of acetabulum on the   right. No definite fracture is appreciated though the amount of osseous   destruction raises concern for impending areas of pathologic fracture.   The acetabular region measures 1.1 x 2.6 cm and the ischial tuberosity     region measures 2.4 x 2.4 cm. There is otherwise no evidence of fracture,   dislocation or malalignment. The soft tissues are grossly unremarkable.   The subcutaneous fat is unremarkable. The musculature appears symmetric   without evidence of infiltration nor gross evidence of  edema.     IMPRESSION:      1. Findings consistent with areas of osseous foci of metastatic disease   until proven otherwise involving the acetabulum and ischial tuberosity on   the right. Correlation with the patient's history is recommended. The   amount of cortical destruction associated with theseareas raise concern   of impending regions of pathologic fracture. No definite acute fracture   is appreciated at this time.   2. Dr. Reita Cliche of the Emergency Department was informed of these findings   via a preliminary faxed report.  Thank you for theopportunity to contribute to the care of your patient.         Verified By: Mikki Santee, M.D., MD  Nuclear Med:    [id number]  10:29, PET/CT Restaging Bladder  PET/CT Restaging Bladder   REASON FOR EXAM:    restaging bladder CA  COMMENTS:       PROCEDURE: PET - PET/CT RESTAGING BLADDER  - Apr 10 2013 10:29AM     RESULT: History: Bladder cancer.    Comparison Study: Head CT of 01/01/2013.    Findings: Following determination of fastingblood sugar 79 mg/dL 12.9   mCi of F-18 FDG administered. CT obtained for attenuation correction and   fusion. Bilateral PET positive iliac chain and left inguinal lymph nodes   are noted. These are new from prior study. Intensely PET positive right   acetabular new bony lesion is present with maximum SUV of 19.7.   Persistent ischial PET positive lesion on the right.  IMPRESSION:    1. Progressive iliac and left inguinal adenopathy.  2. New PET positive acetabular lesion. Persistent PET positiveright   ischial lesion.    These findings together suggest progressive metastatic disease.        Verified By: Osa Craver, M.D., MD   Relevent Results:   Relevant Scans and Labs CT scans and PET CT scan reviewed compatible with the above-stated findings.   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 Would plan on delivering 3000 cGy in 10 fractions. Risks and benefits of treatment including possible skin reaction, possible fatigue, were all explained in detail to the patient and his wife. They both seem to comprehend my treatment plan well. I set patient up for CT simulation early next week. I've advised and on any significant activity over the next several weeks. He will continue on chemotherapy and Zometa therapy with Dr. Grayland Ormond.  I would like to take this opportunity to thank you for allowing me to continue to participate in this patient's care.  CC Referral:  cc: Dr. Viviana Simpler   Electronic Signatures: Baruch Gouty, Roda Shutters (MD)  (Signed 30-Jul-14 14:07)  Authored: HPI, Diagnosis, Past Hx, PFSH, Allergies, Home Meds, ROS, Nursing Notes, Physical Exam, Other Results, Relevent Results, Encounter Assessment and Plan, CC Referring Physician   Last Updated: 30-Jul-14 14:07 by Armstead Peaks (MD)

## 2015-01-10 NOTE — Consult Note (Signed)
   Comments   Follow up visit made. Pt very agitated, sitter and wife at bedside trying to calm him. Just got risperidal 0.25mg . Will give 4mg  morphine now in the event agitation is related to pain.    Electronic Signatures: Cyndal Kasson, Izora Gala (MD)  (Signed 05-Aug-14 16:31)  Authored: Palliative Care   Last Updated: 05-Aug-14 16:31 by Jamera Vanloan, Izora Gala (MD)

## 2015-01-10 NOTE — Consult Note (Signed)
History of Present Illness:  Reason for Consult Stage IV bladder cancer, now with pathologic fracture.   HPI   Patient last seen in clinic and received chemotherapy on April 17, 2013.  He was scheduled to start XRT to his right hip and leg to prevent pathologic fracture, but over the weekend patient became confused and fell causing a pathologic fracture.  Currently other than right leg and hip pain he is back to his baseline. He does not complain of weakness or fatigue today. He has no peripheral neuropathy or other neurologic complaints.  Patient denies any chest pain, cough, or shortness of breath.  He has a fair appetite, but denies weight loss.  He has no nausea, vomiting, constipation, or diarrhea.  Patient offers no further specific complaints today.  PFSH:  Additional Past Medical and Surgical History Non-invasive bladder cancer, kidney stones.  Bladder and prostate surgery, hernia repair.  Social history: Patient denies tobacco or alcohol.  Family history: Prostate cancer.   Review of Systems:  Performance Status (ECOG) 1   Review of Systems   As per HPI. Otherwise, 10 point system review was negative.   NURSING NOTES: **Vital Signs.:   04-Aug-14 13:39   Vital Signs Type: Q 4hr   Temperature Temperature (F): 98   Celsius: 36.6   Pulse Pulse: 78   Respirations Respirations: 18   Systolic BP Systolic BP: 102   Diastolic BP (mmHg) Diastolic BP (mmHg): 63   Mean BP: 83   Pulse Ox % Pulse Ox %: 97   Pulse Ox Activity Level: At rest   Oxygen Delivery: Room Air/ 21 %   Physical Exam:  Physical Exam General: Well-developed, well-nourished, no acute distress. Eyes: Pink conjunctiva, anicteric sclera. HEENT: Normocephalic, moist mucous membranes, clear oropharnyx. Lungs: Clear to auscultation bilaterally. Heart: Regular rate and rhythm. No rubs, murmurs, or gallops. Abdomen: Soft, nontender, nondistended. No organomegaly noted, normoactive bowel  sounds. Musculoskeletal: No edema, cyanosis, or clubbing. Neuro: Alert, answering all questions appropriately. Cranial nerves grossly intact. Skin: No rashes or petechiae noted. Psych: Normal affect.    Penicillin: Rash, Swelling    predniSONE 10 mg oral tablet: 1 tab(s) orally once a day, Status: Active, Quantity: 30, Refills: 2   Percocet 5/325 oral tablet: 1-2 tab(s) PO every 4 hours as needed  FOR PAIN, Status: Active, Quantity: 30, Refills: None  Laboratory Results: Hepatic:  03-Aug-14 15:40   Bilirubin, Total 0.5  Alkaline Phosphatase 78  SGPT (ALT)  103  SGOT (AST)  46  Total Protein, Serum 7.5  Albumin, Serum  2.6  Routine Chem:  03-Aug-14 15:40   Ammonia, Plasma < 25 (Result(s) reported on 22 Apr 2013 at 05:05PM.)  Lipase  66 (Result(s) reported on 22 Apr 2013 at 04:06PM.)  Glucose, Serum  106  BUN  50  Creatinine (comp)  2.06  Sodium, Serum 140  Potassium, Serum 4.8  Chloride, Serum  110  CO2, Serum 23  Calcium (Total), Serum 8.9  Osmolality (calc) 293  eGFR (African American)  37  eGFR (Non-African American)  32 (eGFR values <44m/min/1.73 m2 may be an indication of chronic kidney disease (CKD). Calculated eGFR is useful in patients with stable renal function. The eGFR calculation will not be reliable in acutely ill patients when serum creatinine is changing rapidly. It is not useful in  patients on dialysis. The eGFR calculation may not be applicable to patients at the low and high extremes of body sizes, pregnant women, and vegetarians.)  Anion Gap 7  Cardiac:  03-Aug-14 15:40   CPK-MB, Serum  < 0.5 (Result(s) reported on 22 Apr 2013 at 04:58PM.)  CK, Total  31  Routine Coag:  03-Aug-14 15:40   Prothrombin 13.7  INR 1.0 (INR reference interval applies to patients on anticoagulant therapy. A single INR therapeutic range for coumarins is not optimal for all indications; however, the suggested range for most indications is 2.0 - 3.0. Exceptions to the  INR Reference Range may include: Prosthetic heart valves, acute myocardial infarction, prevention of myocardial infarction, and combinations of aspirin and anticoagulant. The need for a higher or lower target INR must be assessed individually. Reference: The Pharmacology and Management of the Vitamin K  antagonists: the seventh ACCP Conference on Antithrombotic and Thrombolytic Therapy. XBMWU.1324 Sept:126 (3suppl): [id number]. A HCT value >55% may artifactually increase the PT.  In one study,  the increase was an average of 25%. Reference:  "Effect on Routine and Special Coagulation Testing Values of Citrate Anticoagulant Adjustment in Patients with High HCT Values." American Journal of Clinical Pathology 2006;126:400-405.)  Activated PTT (APTT) 29.7 (A HCT value >55% may artifactually increase the APTT. In one study, the increase was an average of 19%. Reference: "Effect on Routine and Special Coagulation Testing Values of Citrate Anticoagulant Adjustment in Patients with High HCT Values." American Journal of Clinical Pathology 2006;126:400-405.)  Routine Hem:  03-Aug-14 15:40   WBC (CBC) 6.1  RBC (CBC)  3.70  Hemoglobin (CBC)  11.1  Hematocrit (CBC)  32.8  Platelet Count (CBC) 196 (Result(s) reported on 22 Apr 2013 at 04:02PM.)  MCV 89  MCH 30.0  MCHC 33.8  RDW  14.8   Assessment and Plan: Impression:   Stage IV bladder cancer, now with pathologic fracture.  Plan:   1.  Bladder cancer:  Patient last received chemotherapy on April 17, 2013 using gemcitabine and Taxol.  Will likely delay any further treatments if patient requires a surgical intervention to his right leg.  If surgical intervention is not necessary,can likely proceed with chemotherapy as previously scheduled.  XRT will also be delayed several days the patient to initiate this later this week.  Renal insufficiency: Patient's creatinine in approximately his baseline, monitor. Right hip pain: Secondary to pathologic  fracture. Palliative XRT as above.  Electronic Signatures: Delight Hoh (MD)  (Signed 04-Aug-14 16:51)  Authored: HISTORY OF PRESENT ILLNESS, PFSH, ROS, NURSING NOTES, PE, ALLERGIES, HOME MEDICATIONS, LABS, ASSESSMENT AND PLAN   Last Updated: 04-Aug-14 16:51 by Delight Hoh (MD)

## 2015-01-10 NOTE — Discharge Summary (Signed)
PATIENT NAME:  ILIAS, STCHARLES MR#:  [id number] DATE OF BIRTH:  1946/07/23  DATE OF ADMISSION:  04/22/2013 DATE OF DISCHARGE:  04/26/2013  PRESENTING COMPLAINT:  Fall and hip pain.  DISCHARGE DIAGNOSES: 1.  Acute right hip ischial tuberosity/pubic fracture status post mechanical fall at home.  2.  New onset atrial fibrillation, improved.  3.  Bladder cancer with bony mets. The patient to get radiation therapy. 4.  Chronic kidney disease, stage III.  5.  Acute delirium, resolved.   CODE STATUS: FULL CODE.   DISPOSITION:  The patient was discharged to home with home health PT.  CONSULTATIONS: 1.  Dr. Mack Guise - orthopedics.  2.  Dr. Rockey Situ - cardiology.  3.  Dr. Ermalinda Memos - palliative care.   LABORATORY AND DIAGNOSTICS: At discharge: White count 4.3, H and H are 9.8 and 37.8. Glucose is 87, BUN 41, creatinine 1.77, and sodium and potassium within normal limits. Cardiac enzymes negative. TSH 0.651.  Ammonia is less than 25.   Echo Doppler showed EF of 50% to 55%, normal left ventricular systolic function. Mild concentric left ventricular hypertrophy.   Repeat EKG on 08/04 showed sinus rhythm with PACs.  Cardiac enzymes x 3 negative. Urine culture: No growth in 18 to 24 hours. Albumin is 2.6.   DISCHARGE MEDICATIONS: 1.  Amiodarone 200 mg b.i.d.  2.  Trazodone 25 mg at bedtime.  3.  Risperidone 0.25 disintegrating tablet p.o. daily.  4.  Metoprolol 25 mg b.i.d.  5.  Aspirin 81 mg daily.  6.  Percocet 5/325 mg 1 to 2 q. 4 p.r.n. as needed for pain.  7.  Cipro 250 mg p.o. b.i.d.  8.  Prednisone 10 mg p.o. daily.   DISCHARGE FOLLOWUP:  1.  With Dr. Silvio Pate in 1 to 2 weeks.  2.  With Dr. Rockey Situ in 1 to 2 weeks.  3.  With Dr. Baruch Gouty in radiation oncology on 08/11 and follow up with Dr. Grayland Ormond after 08/18.  4.  With Dr. Mack Guise in 2 to 4 weeks.  BRIEF SUMMARY OF HOSPITAL COURSE: Mr. Dunlow is a 69 year old Caucasian gentleman with history of bladder cancer with bony mets, CKD  stage III who presents to the hospital after a fall and having significant left hip and pelvic pain. He was noted to have pathologic right pubic rami fracture and also noted to have new onset atrial fibrillation.  He was admitted with:  1.  Status post fall and right pathologic pubic ramus fracture. This is pathologic fracture given his history of bladder cancer with mets. No surgery needed for pelvic fracture per ortho eval, by Dr. Mack Guise. PT was started. The patient will see Dr. Mack Guise in 4 weeks. Home health PT has been set up.  2.  New onset A-fib.  The patient was started on p.o. amiodarone per Dr. Rockey Situ.  No anticoagulation due to fall risk. Baby aspirin was continued. The patient is on amiodarone 200 b.i.d. and follow up with Dr. Rockey Situ as outpatient.  3.  Bladder cancer with bony mets. The patient has been set up an appointment with Dr.  Noreene Filbert, radiation oncology, for starting radiation. He will follow up with Dr. Grayland Ormond after August 18th.  4.  Acute delirium, likely multifactorial, with pain medications and abnormal UA with mild UTI. Resolved; the patient is back to baseline. Occasional mild confusion was noted. CT head reviewed with radiology.  No evidence of mets were noted. I spoke with Dr. Grayland Ormond and recommended to hold off on MRI brain  for now and follow up with him as outpatient.   Hospital stay otherwise remained stable. The patient remained a FULL CODE.   TIME SPENT: 40 minutes.  ____________________________ Hart Rochester Posey Pronto, MD sap:sb D: 04/27/2013 06:54:31 ET T: 04/27/2013 07:12:37 ET JOB#: [id number]  cc: Dawanda Mapel A. Posey Pronto, MD, <Dictator> Timoteo Gaul, MD Armstead Peaks, MD Kathlene November. Grayland Ormond, MD Venia Carbon, MD Minna Merritts, MD  Ilda Basset MD ELECTRONICALLY SIGNED 05/10/2013 7:30

## 2015-01-10 NOTE — Consult Note (Signed)
General Aspect Cody Barry is a 69 yo male with no prior cardiac history, PMHx s/f bladder CA s/p TUR-bladder tumor, recently identified metastasis to R ischemial tuberosity, lytic lesion with pathologic fracture, progressive CKD (stage III), HTN, h/o tobacco abuse and COPD who was admitted to [facility] yesterday for fall, hip fracture and new onset a-fib with RVR.   The patient has a history of bladder CA. He underwent transurethral resection of bladder tumor. This had been stable until last month. He followed up with his PCP c/o R hip pain. Follow-up with oncology revealed positive metabolic activity to R acetabulum. Further work-up demonstrated bony metastasis to R hip/pelvis. The plan was made to restart chemotherapy- first gemcitabine and paclitaxel tx last week. He unforunately had a fall last night. He has been having hallucinations while on Percocet for pain. He was walking toward the TV, felt "swimmy headed and ended up on the floor." Denies LOC. He denies palpitations, chest pain, PND, orthopnea or LE edema. He has chronic SOB. H/o tobacco abuse- 40+ pack-year history.   Present Illness There, EKG revealed course a-fib with RVR, rate 120. Initial trop-I WNL.Marland Kitchen CMET- BUN 50/Cr 2.06. Albumin 2. 6. AST 46/ALT 103. CT pelvis indicated R ischial pathologic fracture. Noncontrast head CT- showed chronic involutional changes without evidence of acute abnormalities. There were areas of hypoattenutation. Follow-up MRI was recommended for persistent clinical concern. He was started on metoprolol IV with good rate control. He was admitted by the medicine service and started on Lopressor 25 BID. A subsequent troponin returned WNL. Echo pending.   PAST MEDICAL HISTORY: Consistent with bladder cancer, chronic kidney disease stage III.   ALLERGIES: TO PENICILLIN.   SOCIAL HISTORY: No smoking. No alcohol abuse. No illicit drug abuse. Lives at home with his wife.   FAMILY HISTORY: Cody Barry died from prostate cancer.  Cody Barry died from complications of kidney and liver failure.   CURRENT MEDICATIONS: Percocet 1 tab q. 4-6 hours as needed for pain and prednisone 10 mg daily.   Physical Exam:  HEENT pink conjunctivae, PERRL, hearing intact to voice   NECK supple  No masses  trachea midline   RESP normal resp effort  clear BS  no use of accessory muscles   CARD Regular rate and rhythm  Normal, S1, S2  No murmur   ABD denies tenderness  soft   EXTR negative cyanosis/clubbing, negative edema   SKIN normal to palpation, No ulcers   NEURO follows commands, motor/sensory function intact   PSYCH alert, A+O to time, place, person   Review of Systems:  Subjective/Chief Complaint fall   General: Weakness   Respiratory: Short of breath   Cardiovascular: No Complaints   Musculoskeletal: Muscle or joint pain   Review of Systems: All other systems were reviewed and found to be negative   Lab Results:  Routine Chem:  03-Aug-14 15:40   BUN  50  Creatinine (comp)  2.06   EKG:  Interpretation atrial fibrillation with RVR, nonspecific ST changes   Rate 120    Penicillin: Rash, Swelling  Vital Signs/Nurse's Notes: **Vital Signs.:   04-Aug-14 09:43  Pulse Pulse 88  Telemetry pattern Cardiac Rhythm Normal sinus rhythm; pattern reported by Telemetry Clerk    Impression 69 yo male with no prior cardiac history, PMHx s/f bladder CA s/p TUR-bladder tumor, recently identified metastasis to R ischemial tuberosity, lytic lesion with pathologic fracture, progressive CKD (stage III), HTN, h/o tobacco abuse and COPD who was admitted to [facility] yesterday for fall, hip  fracture and new onset a-fib with RVR.  1. New onset a-fib with RVR Rate-controlled overnight on Lopressor 25 alone. Has converted back to NSR. EKG ordered and confirmed this AM. He has been largely asymptomatic, not knowing when he is in a-fib. He does note becoming lightheaded prior to falling yesterday. Question if this was secondary to  a-fib with RVR or due hypotension from Percocet use. He had undergone first treatment of chemo last week. Suspect he will continue to have paroxysms of a-fib during treatment. Issue going forward will be anticoagulation- CHADSVASc currently 2 (HTN, age > 7). He has CKD and malignancy increasing his risk of thromboembolism. He typically does not fall and is not unsteady on his feet. He tells me this was an isolated incident.  -- Continue Lopressor 25 BID -- Could start apixaban 2.5mg  with renal function -- Check 2D echo with cardiotoxic chemotherapy use   2. Fall with pathologic fracture Currently pain free. Management per primary team.   3. Metastatic bladder CA Recently restarted on chemotherapy. Primary team has consulted oncology service for recommendations on radiation therapy as well given bone mets.   4. Hypertension Well-controlled.  5. CKD, stage III Progessively worsened. ? Prerenal +/- chemotherapy. Per primary team.   6. Abnormal head CT Areas of hypoattenuation. Consider MRI to r/o brain mets.  7. Transaminitis Interval change since starting chemotherapy. Per primary team.   Electronic Signatures for Addendum Section:  Kathlyn Sacramento (MD) (Signed Addendum 04-Aug-14 17:22)  The patient was seen and examined. Agree with the above with the addition of: stage 4 bladder CA with pthologic fracture. Was noted to be in A-fib with RVR yesterday. Converted to NSR. first documented episode.  Based on his thrombembolic risks, I think he would benefit from longerm anticoagulation. However, he is getting chemotherapy with Gemcitabine which might increase his bleeding complications. For now will treat with Aspirin. Continue Metoprolol. Will have to check with Cody Barry about safety of anticoagulation.   Electronic Signatures: Meriel Pica (PA-C)  (Signed 04-Aug-14 10:22)  Authored: General Aspect/Present Illness, History and Physical Exam, Review of System, Home Medications,  Labs, EKG , Allergies, Vital Signs/Nurse's Notes, Impression/Plan Kathlyn Sacramento (MD)  (Signed 04-Aug-14 17:22)  Co-Signer: General Aspect/Present Illness, Home Medications, Allergies, Impression/Plan   Last Updated: 04-Aug-14 17:22 by Kathlyn Sacramento (MD)

## 2015-01-10 NOTE — Consult Note (Signed)
PATIENT NAME:  Cody Barry, Cody Barry MR#:  [id number] DATE OF BIRTH:  08-Dec-1945  DATE OF CONSULTATION:  04/24/2013  CONSULTING PHYSICIAN:  Timoteo Gaul, MD  REASON FOR CONSULTATION:  Right pelvic fracture.  REQUESTING PHYSICIAN:  Dustin Flock, M.D.   HISTORY OF PRESENT ILLNESS: Cody Barry is a 69 year old male with a history of bladder cancer and chronic stage 3 kidney disease, who fell the night prior to admission. He was complaining of significant hip pain after the fall. The patient has bladder cancer with known bony metastasis. He is being followed in our [place] by Dr. Grayland Ormond. The patient sustained a fall while he attempted to get to the bathroom. The patient had had chemotherapy last week, per his wife, who is at the bedside. He had been having hallucinations and restlessness following this last round of chemotherapy. Following the injury, he was able to get up and get back into bed, but complained of more right hip pain the following morning, was brought to the ER. Upon presentation to the ER, the patient was noted to be in atrial fibrillation. He was admitted to the hospitalist service.   I have reviewed the patient's medical history from the EMR and the admission H and P.   PHYSICAL EXAMINATION: The patient is seen in his hospital room this evening. His wife was at the bedside. The patient is very confused and agitated. There is a Data processing manager also at the bedside. The patient is reaching out trying to grab things, which his wife says is consistent with his hallucinations. He is spontaneously moving both lower extremities and flexing and extending the hip, as well as rotating the right hip. He does have point tenderness over the ischial tuberosity, but does not seem to have pain with logrolling or internal/external rotation of the right hip. Flexion of the hip to 90 degrees seemed to be uncomfortable for the patient. He had spontaneous motor function and had palpable pedal  pulses. He had a soft and compressible thigh and leg compartments. He is unable to follow commands or participate  with the examination and his sensation cannot be assessed.   RADIOLOGY: I have reviewed the x-ray films from the Emergency Department. The pelvis film demonstrates a fracture through the ischial tuberosity on the right side. There is a lytic lesion consistent with the bony metastasis in the ischium on the right side. This fracture would be considered pathologic. There is minimal displacement at the fracture.   ASSESSMENT: Fracture through  metastatic lesion of the ischium.   PLAN:  Cody Barry injury is not one that will require surgical intervention. The ischium is the attachment site to the hamstrings, so hip flexion which may stretch his hamstrings may be  an uncomfortable motion for him. I would expect he would also have pain when sitting on a hard surface. He may require additional padding, such as sitting on a pillow until this fracture heals. I recommend that he be evaluated by physical therapy once his confusion and agitation has resolved. I explained the plan to the wife and she agrees. The patient may follow up in my office in 4 weeks following discharge for re-evaluation and x-ray.   ____________________________ Timoteo Gaul, MD klk:nts D: 04/24/2013 23:43:28 ET T: 04/25/2013 03:45:54 ET JOB#: [id number]  cc: Timoteo Gaul, MD, <Dictator> Timoteo Gaul MD ELECTRONICALLY SIGNED 05/03/2013 16:57

## 2015-01-10 NOTE — Op Note (Signed)
PATIENT NAME:  Cody Barry, Cody Barry MR#:  [id number] DATE OF BIRTH:  1946/05/20  DATE OF PROCEDURE:  10/09/2012  PREOPERATIVE DIAGNOSIS: Bladder cancer.   POSTOPERATIVE DIAGNOSES: Bladder cancer.  PROCEDURE: Transurethral resection of bladder tumor.   SURGEON: Otelia Limes. Yves Dill, M.D.   ANESTHETIST: Dr. Boston Service and Dr. Yves Dill.   ANESTHESIA: General per Dr. Boston Service and local per Dr. Yves Dill.   INDICATIONS: See the dictated history and physical. After informed consent, the patient requested the above procedure.   OPERATIVE SUMMARY: After adequate general anesthesia had been obtained, the patient was placed into the dorsal lithotomy position and the perineum was prepped and draped in the usual fashion. Penile meatus was dilated up to 30-French with male dilators. Resectoscope sheath was then advanced into the bladder with the obturator in place. The resectoscope was coupled with the camera and then placed into the sheath. The bladder was thoroughly inspected. The bladder was heavily trabeculated. The patient had obvious tumor at the bladder neck extending from approximately the 4 o'clock to the 8 o'clock positions. Ureteral orifices were not identified. At this point, the tumor was resected. Fragments were submitted to pathology. The base of the tumor was cauterized. At this point, the scope was removed and 10 mL of viscous Xylocaine instilled within the urethra and the bladder. A 75-OITGPQ silicone catheter was placed. The catheter was irrigated until clear. The procedure was then terminated and the patient was transferred to the recovery room in stable condition.   ____________________________ Otelia Limes. Yves Dill, MD mrw:aw D: 10/09/2012 08:32:06 ET T: 10/09/2012 08:49:04 ET JOB#: [id number]  cc: Otelia Limes. Yves Dill, MD, <Dictator> Royston Cowper MD ELECTRONICALLY SIGNED 10/09/2012 13:19

## 2015-01-10 NOTE — H&P (Signed)
PATIENT NAME:  Cody Barry, Cody Barry MR#:  [id number] DATE OF BIRTH:  07-19-1946  DATE OF ADMISSION:  10/09/2012  CHIEF COMPLAINT: Bleeding.   HISTORY OF PRESENT ILLNESS: The patient is a 69 year old white male who presented to the office with a several-day history of gross painless hematuria and passage of clots. 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 Most recent tumor resection was 10/11/2011 which indicated muscle-invasive disease at that time. He was also found to have probable positive lymph node disease following that. He comes in now for transurethral resection of any residual tumor and fulguration.   ALLERGIES: PENICILLIN.   MEDICATIONS: Currently, he is not taking any medications on a regular basis.   PAST SURGICAL HISTORY: 1. Inguinal herniorrhaphy.  2. TUR bladder tumors in 2011, 2012, and 2013.   SOCIAL HISTORY: Denied tobacco or alcohol use.   FAMILY HISTORY: Noncontributory.   PAST AND CURRENT MEDICAL CONDITIONS: Negative.   REVIEW OF SYSTEMS: The patient denied chest pain, heart disease, diabetes or stroke.   PHYSICAL EXAMINATION: GENERAL: A well-nourished white male in no acute distress.  HEENT: Sclerae were clear. Pupils were equally round and reactive to light and accommodation. Extraocular movements were intact.  NECK: Supple. No palpable cervical adenopathy.  LUNGS: Clear to auscultation.  CARDIOVASCULAR: Regular rhythm and rate without audible murmurs.  ABDOMEN: Soft, nontender abdomen.  GENITOURINARY: Uncircumcised. Testes atrophic.  RECTAL: Deferred.  NEUROMUSCULAR: Alert and oriented x 3.   IMPRESSION:  1. Gross hematuria.  2. Muscle-invasive bladder cancer.   PLAN: TURBT and fulguration.   ____________________________ Otelia Limes. Yves Dill, MD mrw:cb D: 10/04/2012 14:33:05 ET T: 10/04/2012 14:45:47 ET JOB#: [id number]  cc: Otelia Limes. Yves Dill, MD, <Dictator> Royston Cowper MD ELECTRONICALLY SIGNED 10/05/2012 8:23

## 2015-01-11 NOTE — Consult Note (Signed)
PATIENT NAME:  Cody Barry, BRAU MR#:  [id number] DATE OF BIRTH:  13-Feb-1946  DATE OF CONSULTATION:  01/04/2014  REFERRING PHYSICIAN: Dr. Waldron Labs   CONSULTING PHYSICIAN:  Otelia Limes. Yves Dill, MD  REASON FOR CONSULTATION: Bladder cancer.   HISTORY OF PRESENT ILLNESS: Mr. Cappelletti is a 69 year old white male with metastatic bladder cancer, who was admitted to the hospital with persistent hematuria. He reports hematuria from his nephrostomy tubes as well as when he voids. He is voiding with an adequate urinary stream and denies obstructive symptoms. He was hospitalized in March and underwent cystoscopy with fulguration, but the bleeding recurred shortly thereafter. He is currently receiving a blood transfusion. He apparently has not has not received chemotherapy recently, but his oncologist told him he might be able to get some more chemotherapy at the end of this month. He has known metastasis to bone, prostate, lymph nodes and liver.   ALLERGIES: PENICILLIN.   CURRENT MEDICATIONS: Percocet. B and O suppositories, amiodarone, multivitamins, vitamin B and vitamin C.   PAST SURGICAL HISTORY: Include transurethral resection of bladder tumors.  PAST AND CURRENT MEDICAL CONDITIONS:  1.  Atrial fibrillation.  2.  Metastatic bladder cancer. 3.  Chronic renal disease.  4.  Anemia.   IMPRESSION: Metastatic and end-stage bladder cancer with diffuse urological bleeding possibly due to DIC.   SUGGESTIUONS:   1.  The patient appears to be voiding, so catheter is not indicated. 2.  Irrigate nephrostomy tubes as needed and consult interventional radiology if needed.  3.  The bladder cancer is progressive and incurable at this point, so I would suggest palliative and comfort measures only. Surgical intervention is not indicated this time. ____________________________ Otelia Limes. Yves Dill, MD mrw:aw D: 01/04/2014 13:33:12 ET T: 01/04/2014 13:46:08 ET JOB#: [id number]  cc: Otelia Limes. Yves Dill, MD, <Dictator> Royston Cowper MD ELECTRONICALLY SIGNED 01/07/2014 7:43

## 2015-01-11 NOTE — Consult Note (Signed)
PATIENT NAME:  Cody Barry, Cody Barry MR#:  [id number] DATE OF BIRTH:  12-Apr-1946  DATE OF CONSULTATION:  01/04/2014  REFERRING PHYSICIAN:  Albertine Patricia, MD CONSULTING PHYSICIAN:  Darriel Utter R. Ma Hillock, MD  REASON FOR CONSULTATION: Bladder cancer.   HISTORY OF PRESENT ILLNESS: The patient is a 69 year old gentleman with past medical history significant for widely metastatic recurrent bladder cancer, status post multiple lines of treatment, recent prolonged hospitalization in March 2015 when he required bilateral nephrostomy tubes for hydronephrosis, also has intermittent hematuria. He was last seen by Dr. Grayland Ormond on March 25, who is his primary oncologist, and plan at that time was to see him back at the end of this month as outpatient and then make decision whether he would pursue further chemotherapy. The patient states that his generalized weakness and fatigue is about the same, ambulates slowly and minimally inside the home. He is now admitted back with recurrent gross hematuria, hemoglobin 7.3. He is getting packed red blood cell transfusion. He denies any pain issues. Appetite is fairly steady.   PAST MEDICAL HISTORY AND PAST SURGICAL HISTORY: 1.  Stage IV bladder cancer as described above.  2.  Chronic kidney disease, stage III.  3.  Right hip and pubic fracture.  4.  Atrial fibrillation.  5.  Anemia.  6.  Bilateral hydronephrosis, status post nephrostomy tubes.   FAMILY HISTORY: Remarkable for prostate cancer.   SOCIAL HISTORY: Denies smoking or alcohol usage. Generalized weakness and ambulates minimally.   PAST SURGICAL HISTORY:  Cystoscopy, hernia repair, laser vaporization of the prostate, bilateral nephrostomy tubes.   ALLERGIES: INCLUDE PENICILLIN.   HOME MEDICATIONS: Amiodarone 200 mg p.o. daily, multivitamin 1 tablet daily, vitamin B-100 and B-complex 1 daily, vitamin C 250 mg daily, Percocet p.r.n. for pain, Palladone opium rectally q.8 hours as needed.   REVIEW OF  SYSTEMS: CONSTITUTIONAL: As in HPI. No fever or chills.  HEENT: Denies headaches, dizziness at rest, epistaxis, ear or jaw pain. No sinus symptoms.  CARDIAC: No angina, palpitation, orthopnea, or PND.  LUNGS: Has chronic dyspnea on exertion. No chest pain or hemoptysis.  GASTROINTESTINAL: Denies nausea, vomiting, or diarrhea. No bright red blood in stools or melena.  GENITOURINARY: As in HPI. No dysuria. Has bilateral nephrostomy tubes.  SKIN: No new rashes or pruritus.  HEMATOLOGIC: As in HPI.  EXTREMITIES: No new swelling or pain.  MUSCULOSKELETAL: Has arthritis. Denies new bone pains.  NEUROLOGIC: Denies any focal weakness, seizures, or loss of consciousness.   PHYSICAL EXAMINATION: GENERAL: The patient is weak and tired-looking, resting in bed, otherwise alert and oriented x 4 and converses appropriately. No acute distress.  VITAL SIGNS: 98.4, 75, 18, 132/73, 97% on room air.  HEENT: Normocephalic, atraumatic. Extraocular movements intact. Sclerae anicteric.  NECK: Negative for lymphadenopathy.  CARDIOVASCULAR: S1, S2, regular rate and rhythm.  LUNGS: Bilateral good air entry, decreased at bases, no rhonchi.  ABDOMEN: Soft, nontender. No hepatomegaly or masses palpable clinically.  EXTREMITIES: Mild edema. No cyanosis.  SKIN: No generalized rashes or major bruising.  NEUROLOGIC: Cranial nerves seem intact. Moves all extremities spontaneously.   LABORATORY DATA:  April 16 - hemoglobin 7.9, WBC 14,600, platelets 464. Creatinine 1.92, potassium 4.9, calcium 8.3. Liver function tests: Alkaline phosphatase 373, ALT 53, AST 73, albumin 2.1.   IMPRESSION AND RECOMMENDATION: This is a 69 year old gentleman with recurrent stage IV metastatic bladder cancer with poor performance status (currently ECoG 3 at least), status post multiple kinds of chemotherapy in the past, with most recent CT scan on  March 25 showing progression of metastatic disease with liver lesions and in the bladder. The  patient was last seen by Dr. Grayland Ormond in March and deemed a poor candidate for chemotherapy at that time. His performance status continues to be poor. I have explained that it is unlikely that he is a good candidate to continue on aggressive chemotherapy regimens and also, the chance of chemotherapy working would be quite small. The patient still wants to wait, if possible, until he meets with Dr. Grayland Ormond on April 30 and then make decision whether he will take further treatment or not. He is admitted with hematuria. Urology is following and have recommended irrigation and supportive treatment. Agree with supportive transfusion as indicated. Palliative care also is following. They have discussed code status. I have also discussed code status with patient and wife present at bedside. He is still undecided at this time and wants to think about it. No major pain issues at this time. Agree with ongoing supportive treatment and medications. If he is discharged soon, he was advised to keep followup appointment with Dr. Grayland Ormond as previously scheduled. In between, if condition declines, I have recommended that they consider initiating hospice.   Thank you for the referral. Please feel free to contact me if any additional questions.   ____________________________ Rhett Bannister Ma Hillock, MD srp:jcm D: 01/04/2014 16:53:47 ET T: 01/04/2014 18:06:01 ET JOB#: [id number]  cc: Gedeon Brandow R. Ma Hillock, MD, <Dictator> Alveta Heimlich MD ELECTRONICALLY SIGNED 01/05/2014 15:35

## 2015-01-11 NOTE — Op Note (Signed)
PATIENT NAME:  Cody Barry, Cody Barry MR#:  [id number] DATE OF BIRTH:  October 10, 1945  DATE OF PROCEDURE:  12/11/2013  PREOPERATIVE DIAGNOSES: 1.  Hematuria.  2.  Metastatic bladder cancer.   POSTOPERATIVE DIAGNOSIS:  Recurrent bladder tumor.   PROCEDURES: 1.  Clot evacuation.  2.  Transurethral resection of bladder tumor.    SURGEON:  Cody C. Bernardo Heater, MD  ASSISTANT: None.   ANESTHESIA: General.   INDICATION: This is a 69 year old male with a history of bladder cancer treated with radiation and chemotherapy.  He has bone metastasis.  He was recently admitted for gross hematuria. Recent cystoscopy in the office by Dr. Yves Dill was apparently felt consistent with radiation change. A CT scan on 03/10 showed left bladder wall thickening suspicious for recurrent tumor. He has been on continuous bladder irrigation. He had attempted cystoscopy last week by Dr. Elnoria Howard, however, upon introduction of the cystoscope went into pulseless V. tach. He has been evaluated by cardiology and cleared for surgery. CT scan performed last week showed new-onset bilateral hydronephrosis. He has had a rise in creatinine. He had bilateral percutaneous nephrostomy tubes placed last week with significant improvement in his creatinine and hematuria.   DESCRIPTION OF PROCEDURE:  The patient was taken to the Operating Room and placed on the table in a supine position. A general anesthetic was administered without problems.  He was placed in the low lithotomy position and his external genitalia were prepped and draped in the usual fashion. A timeout was performed per protocol. A 21-French cystoscope with 30 degree lens was lubricated, passed under direct vision.  A large amount of clot was present and multiple syringefuls of clot were evacuated. Once all clot was evacuated, cystoscopy was performed which showed abnormal-appearing mucosa on the left bladder wall consistent with recurrent tumor. The ureteral orifices could not be identified. The  bladder was noted to be contracted. The cystoscope was removed. A 27-French continuous-flow resectoscope sheath with visual obturator was placed without problems. The [facility] resectoscope with loop was then placed into the sheath. Several areas of the left lateral wall tumor were resected. The tumor was not completely resected. Hemostasis was obtained with the button electrode. All specimen was removed with irrigation. A 24-French, 3-way Foley catheter was placed without difficulty. The catheter was irrigated with effluent pink-tinged. Catheter was placed to CBI. A B and O suppository was placed per rectum.  He was taken to the PACU in stable condition. There were no complications. EBL minimal.    ____________________________ Ronda Fairly. Bernardo Heater, MD scs:cs D: 12/19/2013 16:27:07 ET T: 12/19/2013 20:25:39 ET JOB#: [id number]  cc: Nicki Reaper C. Bernardo Heater, MD, <Dictator> Abbie Sons MD ELECTRONICALLY SIGNED 12/26/2013 13:55

## 2015-01-11 NOTE — Discharge Summary (Signed)
PATIENT NAME:  Cody Barry, Cody Barry MR#:  [id number] DATE OF BIRTH:  03-19-1946  DATE OF ADMISSION:  12/03/2013 DATE OF DISCHARGE:  12/13/2013  ADMITTING PHYSICIAN: Dr. Bridgett Larsson  DISCHARGING PHYSICIAN: Gladstone Lighter, MD  PRIMARY ONCOLOGIST: Dr. Grayland Ormond  PRIMARY CARE PHYSICIAN: Dr. Leotis Shames IN HOSPITAL:  1.  Oncology by Dr. Grayland Ormond. 2.  Urology by Dr. Elnoria Howard and Dr. Bernardo Heater.   3.  Palliative care by Dr. Ermalinda Memos.  4.  Cardiology by Dr. Rockey Situ. 5.  Nephrology by Dr. Anthonette Legato.    DISCHARGE DIAGNOSES: 1.  Gross hematuria.  2.  Acute on chronic anemia status post 2 units packed RBC transfusion this admission.  3.  Progressive stage IV bladder cancer with bone and liver metastases.  4.  Cardiac arrhythmia during cystoscopy procedure.  5.  Acute renal failure, which is secondary to post obstructive causes status post bilateral nephrostomy tubes for bilateral hydronephrosis.  6.  Atrial fibrillation.  7.  Chronic kidney disease, stage III, at baseline.  DISCHARGE HOME MEDICATIONS: 1.  Vitamin B complex with vitamin C 1 capsule p.o. daily.  2.  Multivitamin 1 tablet p.o. daily.  3.  Vitamin C 250 mg p.o. daily.  4.  Percocet 5/325 mg 1 tablet q. 6 hours p.r.n. for pain.  5.  Belladonna opium suppository 1 per rectum every 8 hours as needed for bladder spasm pain.  6.  Amiodarone 200 mg p.o. daily.  7.  Ferrous sulfate 325 mg p.o. daily.   DISCHARGE DIET: Low-sodium diet.   DISCHARGE ACTIVITY: As tolerated.  FOLLOWUP INSTRUCTIONS: 1.  Follow up with Dr. Grayland Ormond in 2 weeks.  2.  PCP followup in 1 to 2 weeks.  3.  Home health nursing and physical therapy.  4.  Nursing instructions for home health: Advised to flush the nephrostomy tubes with 10 mL water twice a day. Do not aspirate and also clean the nephrostomy tube area on the skin and apply dry dressing daily.   LABS AND IMAGING STUDIES PRIOR TO DISCHARGE: WBC 10.1, hemoglobin 7.7, hematocrit 23.9, platelet count  264,000.   Sodium 141, potassium 4.1, chloride 110, bicarb 26, BUN 29, creatinine 2.08, glucose 80, and calcium 6.4.  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   BRIEF HOSPITAL COURSE: For more details, please look at the history and physical dictated by Dr. Bridgett Larsson on 16th of March 2015 and also interim discharge summary dictated by him for December 12, 2013. In brief, Cody Barry is a 69 year old pleasant Caucasian male with history of advanced bladder cancer in spite of chemotherapy to stage IV with bone and new liver mass,  chronic atrial fibrillation, presents to the hospital secondary to hematuria.  1.  Gross hematuria secondary to bladder cancer. The patient had a Foley and continuous bladder irrigation in spite of which he started to bleed a lot so urologist saw the patient and attempted cystoscopy, and the patient had a cardiac arrhythmia so the procedure was aborted, and the patient was monitored on telemetry. He continued to have hematuria to the point that he became anemic and required total of 2 units of packed RBC transfusion this admission. Finally, after cardiac clearance, which was obtained after the patient had a negative stress test in the hospital, cystoscopy was repeated and the patient's bladder wall was showing a mass biopsy of which showed high-grade urothelial carcinoma. The patient's Foley catheter was taken out as his clot was evacuated from the bladder. The patient does  not have anymore bleeding at this time. He will be following up with urology as needed as an outpatient.  2.  Progressive bladder cancer with stage IV mets to bone and liver. Seen by Dr. Grayland Ormond. The patient was on chemo before. Chemo had to be stopped and repeat PET scan showing new liver masses. However, due to the all the acute nature of his illness at time chemo will be held until the patient sees Dr. Grayland Ormond in the office in 2 weeks. Repeat labs will be  done depending upon the patient's clinical condition. Decision will be made whether to start chemo or consider hospice services at that time. The patient is slightly active at baseline. Palliative care has been following the patient while he was in the hospital. The patient wished to be a FULL code at this time. 3.  Acute renal failure on top of CKD, stage III. Baseline creatinine seems to be around 1.7 to 2; however, renal failure occurred to the point that his creatinine went up to 5 with oliguria. Ultrasound showed bilateral hydronephrosis and cystoscopy confirmed that the bladder mass was blocking the outlets of the ureter into the bladder. So, urgent bilateral nephrostomy tubes were placed. At this time, they are draining clear yellow urine. Home health will be following the patient and orders have been given to flush the catheters twice every day.  4.  Atrial fibrillation. The patient's rate is controlled. He is in normal sinus rhythm and he is on amiodarone. He is not on anticoagulation, which would worsen his anemia and also hematuria.  CODE STATUS: FULL code.   His course has been otherwise uneventful in the hospital.   DISCHARGE CONDITION: Guarded with poor long-term prognosis.   DISCHARGE DISPOSITION: Home with home health.   TIME SPENT ON DISCHARGE: 45 minutes.  ____________________________ Gladstone Lighter, MD rk:sb D: 12/13/2013 14:52:55 ET T: 12/13/2013 17:15:18 ET JOB#: [id number]  cc: Gladstone Lighter, MD, <Dictator> Simonne Come. Inez Pilgrim, MD Venia Carbon, MD Gladstone Lighter MD ELECTRONICALLY SIGNED 12/27/2013 13:48

## 2015-01-11 NOTE — Discharge Summary (Signed)
PATIENT NAME:  Cody Barry, Cody Barry MR#:  [id number] DATE OF BIRTH:  11/10/1945  DATE OF ADMISSION:  12/03/2013 DATE OF DISCHARGE:    PRIMARY CARE PHYSICIAN:  Venia Carbon, MD.    INTERIM DISCHARGE INSTRUCTIONS    CONSULTATIONS: Urology, Janice Coffin. Elnoria Howard DO; Oncology, Orwin Grayland Ormond, MD; Nephrology, Tama High, MD.    CURRENT DIAGNOSES:  1. Acute on chronic anemia from hematuria status post 2 units packed red blood cell transfusion.  2. Acute renal failure.  3. Bilateral hydronephrosis.  4. Prostate cancer with bone metastasis.  5. Chronic atrial fibrillation.   6. Chronic kidney disease.    REASON FOR ADMISSION: Hematuria for one week, urine retention for 3 days.   HOSPITAL COURSE: The patient is a 69 year old Caucasian male with a history of atrial fibrillation, bladder cancer with bone metastasis, presents in the ED with hematuria for 1 week and urinary retention 3 days. For detailed history and physical examination, please refer to the admission note dictated by me. On admission date, the patient's laboratory data showed BUN 32, creatinine 2.59. Electrolytes were normal. Urinalysis showed hematuria. WBC 9.6, hemoglobin 11.  CAT scan of abdomen and pelvis showed progression of metastatic disease from bladder carcinoma.   1. Hematuria and urinary retention.  After admission, the patient was treated with CBI according to Dr. Guinevere Ferrari recommendations.  However, the patient continuously had a blood clot which blocked CBI.  So the patient was sent to OR for cystoscopy; however, the patient developed Ventricular fibrillation and was transferred to Critical Care Unit.  Since the patient had no complaints, the patient was transferred to floor after a cardiology evaluation. The patient underwent a stress test which was negative.  Echocardiogram was normal.  However, patient continuously had hematuria and renal function has been worsening. The patient's creatinine increased to 5.45, BUN  increased to 50 so urologist did an emergent bilateral nephrostomy.  Kidney ultrasound showed bilateral hydronephrosis so the patient was sent to OR.  The patient underwent bilateral nephrostomy. The patient's renal function has been improving after bilateral nephrostomy.  BUN decreased to 42, creatinine decreased to 2.9. We will continue monitor BMP.   2. Anemia, possibly due to hematuria. The patient hemoglobin decreased to 6.6 yesterday. The patient so far got a total of 2 units PRBC transfusion.  Hemoglobin increased to 7.9. The patient has no active bleeding.   3. Atrial fibrillation, chronic.  The patient has no anticoagulation due to hematuria and acute  blood loss from hematuria. Heart rate is controlled.  4. Prostate and bladder cancer with bone metastasis. The patient needs to follow up with Dr. Grayland Ormond as outpatient.   I discussed the patient's condition and the poor prognosis with the patient, the patient's wife.  They still want full code.  The patient will be  possibly discharged to home in 2 days.    ____________________________ Demetrios Loll, MD qc:by D: 12/10/2013 16:55:00 ET T: 12/10/2013 21:03:25 ET JOB#: [id number]  cc: Demetrios Loll, MD, <Dictator> Demetrios Loll MD ELECTRONICALLY SIGNED 12/14/2013 16:17

## 2015-01-11 NOTE — Consult Note (Signed)
For cystoscopy this am.questions were answered.  Electronic Signatures: Abbie Sons (MD)  (Signed on 24-Mar-15 07:10)  Authored  Last Updated: 24-Mar-15 07:10 by Abbie Sons (MD)

## 2015-01-11 NOTE — Consult Note (Signed)
Brief Consult Note: Diagnosis: Metastatic bladder CA with ureteral obstruction.   Patient was seen by consultant.   Consult note dictated.   Recommend further assessment or treatment.   Discussed with Attending MD.   Comments: Hematuria will continue because the cancer is progressing and incurable. Suggest proceeding with palliative/comfort measures only. Dr. Deatra Ina is on call for urology this weekend if needed.  Electronic Signatures: Royston Cowper (MD)  (Signed 17-Apr-15 13:23)  Authored: Brief Consult Note   Last Updated: 17-Apr-15 13:23 by Royston Cowper (MD)

## 2015-01-11 NOTE — Consult Note (Signed)
PATIENT NAME:  Cody Barry, Cody Barry MR#:  [id number] DATE OF BIRTH:  May 29, 1946  DATE OF CONSULTATION:  12/06/2013  CONSULTING PHYSICIAN:  Octavious Zidek Lilian Kapur, MD  REQUESTING PHYSICIAN:  Dr. Gladstone Lighter  REASON FOR CONSULTATION:  Acute renal failure in the setting of chronic kidney disease stage III.   HISTORY OF PRESENT ILLNESS: The patient is a pleasant, 69 year old Caucasian male with past medical history of chronic kidney disease stage III, bladder cancer with bony metastasis, atrial fibrillation, history of right hip fracture, who presented to [facility] originally with hematuria. He reports that hematuria had been ongoing for one week prior to admission. It was intermittent in nature. He has known underlying bladder cancer, previously followed by Dr. Eliberto Ivory. He had cystoscopy after admission. He went into an apparent dysrhythmia, which may have been ventricular fibrillation postoperatively. The patient has had a recent history of clot retention. We are asked to see him now for acute renal failure. Upon presentation, creatinine was 2.59. However, his baseline creatinine appears to be 1.5. Creatinine is now up to 4.36, with a potassium of 6.0. Serum bicarbonate is also low at 19. He does not have a Foley catheter in place at present. It appears that another cystoscopy is being planned; however, preoperative cardiac evaluation has been requested.   PAST MEDICAL HISTORY: 1.  Chronic kidney disease stage III.  2.  Bladder cancer with bony metastasis.  3.  Atrial fibrillation.  4.  History of right hip and pubic fracture.   ALLERGIES: PENICILLIN.   CURRENT INPATIENT MEDICATIONS:  Include 0.9 normal saline at 100 mL per hour, amiodarone 200 mg p.o. daily, vitamin C 500 mg p.o. b.i.d., morphine 2 mg IV q. 2 hours p.r.n. pain, multivitamin 1 tablet p.o. daily, calcium carbonate 500 mg p.o. b.i.d.   SOCIAL HISTORY: The patient is married. He lives in [place]. He denies  tobacco, alcohol, or illicit drug use. He does have history of tobacco use in the past, however.  FAMILY HISTORY: The patient's father deceased secondary to prostate cancer. The patient's mother died from complications of liver and kidney failure.   REVIEW OF SYSTEMS:   GENERAL:  Denies fevers, chills, weight loss.  EYES: Denies diplopia, blurry vision.  HEENT: Denies headaches or hearing loss. Denies epistaxis.  CARDIOVASCULAR: Denies chest pain, palpitations. Had recent history of dysrhythmia.  RESPIRATORY: Denies cough, shortness of breath, hemoptysis.  GASTROINTESTINAL: Endorses nausea and vomiting.  GENITOURINARY: Reports difficulty urinating and pain overlying his bladder.  MUSCULOSKELETAL: Denies joint pain, swelling or redness.  INTEGUMENTARY: Denies skin rashes or lesions.  NEUROLOGIC: Denies focal extremity numbness, weakness or tingling.  PSYCHIATRIC: Denies depression, bipolar disorder.  ENDOCRINE: Denies polyuria, polydipsia, or polyphagia.  HEMATOLOGIC AND LYMPHATIC: Denies easy bruisability, bleeding, or swollen lymph nodes.  ALLERGY AND IMMUNOLOGIC: Denies seasonal allergies or history of immunodeficiency.   PHYSICAL EXAMINATION: VITAL SIGNS: Temperature 98.4, pulse 72, respirations 11, blood pressure 132/63, pulse ox 97% with on 2 liters.  GENERAL: Exam reveals a well-developed, well-nourished Caucasian male who appears his stated age, currently in no acute distress.  HEENT: Normocephalic, atraumatic. Extraocular movements are intact. Pupils are equal, round and reactive to light. No scleral icterus. Conjunctivae are pink. No epistaxis noted. Gross hearing intact. Oral mucosa moist.  NECK: Supple, without JVD or lymphadenopathy.  LUNGS: Clear to auscultation bilaterally, with normal respiratory effort.  CARDIOVASCULAR:  S1, S2. Periods of irregularity noted. No murmurs or rubs appreciated.  ABDOMEN: Soft, nontender, nondistended. Bowel sounds positive. No rebound,  no  guarding. No organomegaly appreciated.  EXTREMITIES: No clubbing or cyanosis noted. There was 1+ bilateral lower extremity edema noted.  NEUROLOGIC: The patient is awake, alert, oriented to time, person, place. Strength is 5/5 in both upper and lower extremities.  GENITOURINARY: There is some suprapubic tenderness noted.  MUSCULOSKELETAL: No joint redness, swelling or tenderness appreciated.  SKIN: Warm and dry. No rashes noted.  PSYCHIATRIC: The patient with appropriate affect, and appears to have good insight into his current illness.   LABORATORY DATA: Sodium 136, potassium 6, chloride 113, CO2 of 19, BUN 43, creatinine 4.36, glucose 105, serum calcium 6.8, albumin 2.6. CBC shows WBC 9.4, hemoglobin 8.2, hematocrit 25, platelets of 224. Urinalysis showed too numerous count RBCs. A 2-D echocardiogram showed ejection fraction 55% to 60%, impaired relaxation pattern of LV diastolic filling.   IMPRESSION: This is a 69 year old Caucasian male with past medical history of chronic kidney disease stage III, bladder cancer with bony metastasis, atrial fibrillation, right hip fracture, who presented to [facility] with gross hematuria. He has now developed acute renal failure, metabolic acidosis, hyperkalemia.   1.  Acute renal failure/chronic kidney disease stage III. We suspect that obstruction may be playing some role now in his acute renal failure. We agree with plans for a renal ultrasound. He wants to have cystoscopy; however, he is underlying preprocedural cardiac evaluation. They were requesting stress testing. However, it was felt that serum potassium would need to be corrected before this could occur. We talked about the potential for renal placement therapy if renal function were not improved. It appears that patient and his wife are agreeable to this if needed. There is no urgent indication at present; however, we will re-evaluate his renal parameters in the morning.  2.   Hyperkalemia. Serum potassium elevated at 6.0. The patient is quite nauseous at present. Therefore, we will administer rectal Kayexalate 45 grams PR x 1, and continue to follow serum potassium.   3.  Metabolic acidosis. Serum bicarbonate noted to be 19. If this drops further, we may consider switching him to a bicarbonate-based IV fluid regimen.   4.  Bladder cancer with hematuria and anemia blood loss. The patient has been transfused this admission. He has been followed by Urology, Dr. Elnoria Howard. Cystoscopy was to occur; however, there is a cardiology evaluation undergoing first, since there was V Fib arrest earlier this admission. We are obtaining renal ultrasound to see if there is any underlying obstruction now.   I would like to thank Dr. Tressia Miners for this kind referral.  Further plan as patient progresses.      ____________________________ Tama High, MD mnl:mr D: 12/06/2013 19:49:19 ET T: 12/06/2013 20:33:51 ET JOB#: [id number]  cc: Tama High, MD, <Dictator> Tama High MD ELECTRONICALLY SIGNED 12/18/2013 8:55

## 2015-01-11 NOTE — Consult Note (Signed)
General Aspect 69 year old Caucasian male with a history of right hip fracture, A-fib, and bladder cancer with bone metastasis who presented to the ED with hematuria for 1 week, worsening for the past 3 days with urine retention.S/p cystoscopy with notes indicating VT arrest (details unclear). Cardiology has been consulted for arrhythmia, preop eval.   hematuria 2 weeks ago so he discontinued aspirin, but the patient???s hematuria has been worsening for the past 1 week. Significant blood clots in the urine, urine retention, urinary frequency with less urine. Cystoscopy on Tuesday with VF arrest per the notes. Wife reports it did not require shock to terminate. No rhythm strips available.   This evening he reports significant bladder pain, passing clots.Pain is 6 to 7/10. He is not eating well, no BMs. Potassium elevated at 6.0. Nurses report orders have been placed by hospitalist to address potassium. HCT has been dropping presumably from hematuria, now HCT 25, up from 22 following PRBC. As an inpatient, no significant arrhythmia, lots of artifact. maintaining NSR  Last received chemotherapy in October 2014. CT scan reveals possible progressive disease in his liver.  he denies any prior cardiac hx apart from paroxysmal atrial fibrillation.    PAST MEDICAL HISTORY:  1.  Right hip and pubic fracture.  2.  A-fib.  3.  Bladder cancer with bone metastasis. 4.  CKD stage III.   SOCIAL HISTORY: No smoking or drinking or illicit drugs.   FAMILY HISTORY: Father died of prostate cancer. Mother died of complications of kidney and liver failure.   PAST SURGICAL HISTORY: Hernia repair, cystoscopy, laser vaporization of the prostate.   ALLERGIES: PENICILLIN.   Review of Systems:  Subjective/Chief Complaint ABD pain, bladder discomfort, sense that he has to urinate   General: Weakness   Skin: No Complaints   ENT: No Complaints   Eyes: No Complaints   Neck: No Complaints   Respiratory:  Short of breath   Cardiovascular: Dyspnea   Gastrointestinal: No Complaints   Genitourinary: Frequent urination  Hematuria   Vascular: No Complaints   Musculoskeletal: No Complaints   Neurologic: No Complaints   Hematologic: No Complaints   Endocrine: No Complaints   Psychiatric: No Complaints   Review of Systems: All other systems were reviewed and found to be negative   Medications/Allergies Reviewed Medications/Allergies reviewed     Bone Met:    Hard of Hearing:    kidney stones:    anemia:    BPH:    bladder cancer:    Laser vaporization of the prostate: 2012   Heernia repair with mesh:    cysto x 2:        Admit Diagnosis:   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: Onset Date: 06-Dec-2013, Status: Active, Description: 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  Home Medications: Medication Instructions Status  amiodarone 200 mg oral tablet 1 tab(s) orally once a day Active  Vitamin B-100 Vitamin B Complex with C oral tablet, extended release 1 tab(s) orally once a day Active  multivitamin 1 tab(s) orally once a day Active  Vitamin C 250 mg oral tablet, chewable 1 tab(s) orally once a day Active   Radiology Results: Cardiology:    19-Mar-15 14:17, Echo Doppler  Echo Doppler   REASON FOR EXAM:      COMMENTS:       PROCEDURE: [place] - ECHO DOPPLER COMPLETE(TRANSTHOR)  - Dec 06 2013  2:17PM     RESULT: Echocardiogram Report    Patient Name:   Cody Barry [facility]  Date of Exam: 12/06/2013  Medical Rec #:  [id number]              Custom1:  Date of Birth:  [birth year]            Height:       71.0 in  Patient Age:    69 years            Weight:       190.0 lb  Patient Gender: M                   BSA:          2.06 m??    Indications: Atrial Fib  Sonographer:    Sherrie Sport RDCS  Referring Phys: Demetrios Loll    Sonographer Comments: Suboptimal parasternal window.    Summary:   1. Challenging image quality   2. Normal global left ventricular systolic  function.   3. Left ventricular ejection fraction, by visual estimation, is 55 to   60%.   4. Impaired relaxation pattern of LV diastolic filling.   5. No focal wall motion abnormality.   6. Normal right ventricular size and systolic function.   7. Mild tricuspid regurgitation.   8. Mild mitral valve regurgitation.   9. Mildly elevated pulmonary artery systolic pressure.  2D AND M-MODE MEASUREMENTS (normal ranges within parentheses):  Left Ventricle:          Normal  IVSd (2D):      1.17 cm (0.7-1.1)  LVPWd (2D):     1.46 cm (0.7-1.1) Aorta/LA:                  Normal  LVIDd (2D): 4.75 cm (3.4-5.7) Aortic Root (2D): 2.90 cm (2.4-3.7)  LVIDs (2D):     3.50 cm           Left Atrium (2D): 3.50 cm (1.9-4.0)  LV FS (2D):     26.3 %   (>25%)  LV EF (2D):     51.5 %   (>50%)                                    Right Ventricle:                              RVd (2D):        0.45 cm  LV SYSTOLIC FUNCTION BY 2D PLANIMETRY (MOD):  EF-A4C View: 58.0 % EF-A2C View: 53.2 % EF-Biplane: 40.9 %  LV DIASTOLIC FUNCTION:  MV Peak E: 1.10 m/s E/e' Ratio: 13.40  MV Peak A: 1.11 m/s Decel Time: 190 msec  E/A Ratio: 0.99  SPECTRAL DOPPLER ANALYSIS (where applicable):  Mitral Valve:  MV P1/2 Time: 55.10 msec  MV Area, PHT: 3.99 cm??  Aortic Valve: AoV Max Vel: 1.34 m/s AoV Peak PG: 7.2 mmHg AoV Mean PG:  LVOT Vmax: 1.01 m/s LVOT VTI:  LVOTDiameter: 2.20 cm  AoV Area, Vmax: 2.85 cm?? AoV Area, VTI:  AoV Area, Vmn:  Tricuspid Valve and PA/RV Systolic Pressure: TR Max Velocity: 2.88 m/s RA   Pressure: 5 mmHg RVSP/PASP: 38.3 mmHg  Pulmonic Valve:  PV Max Velocity: 1.09 m/s PV Max PG: 4.8 mmHg PV Mean PG:    PHYSICIAN INTERPRETATION:  Left Ventricle: The left ventricular internal cavity size was normal. LV     posterior wall thickness was normal. Borderline  left ventricular   hypertrophy. Global LV systolic function was normal. Left ventricular   ejection fraction, by visual estimation, is 55 to 60%.  Spectral Doppler   shows impaired relaxation pattern of LV diastolic filling.  Right Ventricle: Normal right ventricular size, wall thickness, and   systolic function. The right ventricular size is normal. Global RV   systolic function is normal.  Left Atrium: The left atrium is normal in size.  Right Atrium: The right atrium is normal in size.  Pericardium: There is no evidence of pericardial effusion.  Mitral Valve: The mitral valve is normal in structure. Mild mitral valve   regurgitation is seen.  Tricuspid Valve: The tricuspid valve is normal. Mild tricuspid   regurgitation is visualized. The tricuspid regurgitant velocity is 2.88   m/s, and with an assumed right atrial pressure of 5 mmHg, the estimated     right ventricular systolic pressure is mildly elevated at 38.3 mmHg.  Aortic Valve: The aortic valve is structurally normal, with no evidence   of sclerosis or stenosis. No evidence of aortic valve regurgitation is   seen.  Pulmonic Valve: The pulmonic valve is normal. Trace pulmonic valve   regurgitation.  Aorta: The aortic root and ascending aorta are structurally normal, with   no evidence of dilitation.    [id number] Ida Rogue MD  Electronically signed by [id number] Ida Rogue MD  Signature Date/Time: 12/06/2013/6:06:47 PM    *** Final ***  IMPRESSION: .        Verified By: Minna Merritts, M.D., MD    Penicillin: Rash, Swelling  Vital Signs/Nurse's Notes: **Vital Signs.:   19-Mar-15 15:00  Vital Signs Type Blood Transfusion Complete  Temperature Temperature (F) 98  Celsius 36.6  Pulse Pulse 76  Respirations Respirations 22  Systolic BP Systolic BP 767  Diastolic BP (mmHg) Diastolic BP (mmHg) 78  Mean BP 98  Pulse Ox % Pulse Ox % 99  Oxygen Delivery 2L    Impression 69 year old Caucasian male with a history of right hip fracture, A-fib, and bladder cancer with bone metastasis who presented to the ED with hematuria for 1 week, worsening for the past 3  days with urine retention.S/p cystoscopy with notes indicating VT arrest (details unclear). Cardiology has been consulted for arrhythmia, preop eval.  1) VF arrest: details unavailable, no tele or rhythmi strips no notes in computer to detail events significant artifact on tele since 3/18 through 3/19 with no arrhythmia (possible artifact concerning for arrhythmia?) -Echo is essentially normal No knonwn CAD.  Stress test has been ordered for tomorrow though will need to reevaluate in the AM His potassium is too high currently for stress testing (6), need to monitor anemia (if less than 24, would probably delay testing), also pain control an issue and may be unable to lay flat and still for images if not well controlled. --Suggest our team evaluate in AM prior to stress testing. If no ischemia and normal echo, would continue amiodarone po, proceed with procedure.  Would place on telemetry so that we can monitor the rhythm  Further arrhythmia such as VT or VT or atriaol fib can be treated with additional amiodarone.    2)   Renal insufficiency: Creatinine worse than baseline, likely due to bladder obstruction. 3.  Hematuria: Likely secondary to his known bladder cancer, bladder irrigation ongoing.  Appreciate urology input. 4.  Anemia: Despite his hematuria, patient's hemoglobin has remained relatively stable.  Monitor. 5.  Pathologic fracture to right hip: Patient has completed  XRT.   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 self catheterisation recommended   4)  acute renal failure on CKD-   likely ATN vs obstruction (clots in bladder)   5) Hyperkalemia on fluids  6)  Prostrate/bladder cancer-  bone mets, follows with Dr. Grayland Ormond pain meds prn  7)  Afib-  on amiodarone,  not on anti coagulation, in NSR   Electronic Signatures: Ida Rogue (MD)  (Signed 19-Mar-15 19:06)  Authored: General Aspect/Present Illness, Review of System, Past Medical History, Health Issues, Home Medications, Radiology, Allergies, Vital Signs/Nurse's Notes, Impression/Plan   Last Updated: 19-Mar-15 19:06 by Ida Rogue (MD)

## 2015-01-11 NOTE — Consult Note (Signed)
Cody Barry is an established patient of Cody Barry.  He was seen in consult by Dr. Elnoria Howard yesterday for hematuria and clots.  He was started on continuous bladder irrigation.  He has a known diagnosis of metastatic bladder cancer with prior radiation therapy to the pelvic region.  He was seen by Cody Barry last week.  Cystoscopy was performed.  It was felt that the bleeding was coming from the previous radiation within the bladder.  The nursing staff was unable to contact Dr. Elnoria Howard this morning for issues with the bladder irrigation.  I agreed to see the patient due to clot retention with the Foley catheter.  The bladder was manually irrigated.  An approximate 100 mL collection of clots was irrigated from the bladder.  Subsequent irrigation flowed well.  The catheter was also repositioned with deflation and reinflation of the balloon.  This helped with irrigation and subsequent flow.  The potential need for a Couvelaire hematuria catheter was discussed with the nurse.  One was provided from the operating room to have on hand.  Cody Barry has agreed to come and see the patient around noon today.  It appears that he has agreed to follow the patient from Dr. Elnoria Howard.  Electronic Signatures: Murrell Redden (MD)  (Signed on 17-Mar-15 12:14)  Authored  Last Updated: 17-Mar-15 12:14 by Murrell Redden (MD)

## 2015-01-11 NOTE — H&P (Signed)
PATIENT NAME:  Cody Barry, Cody Barry MR#:  [id number] DATE OF BIRTH:  November 03, 1945  DATE OF ADMISSION:  12/03/2013  PRIMARY CARE PHYSICIAN: Viviana Simpler, MD  CHIEF COMPLAINT: Hematuria for 1 week, urine retention for 3 days.   HISTORY OF PRESENT ILLNESS: A 69 year old Caucasian male with a history of right hip fracture, A-fib, and bladder cancer with bone metastasis who presented to the ED with hematuria for 1 week, worsening for the past 3 days with urine retention The patient is alert, awake, and oriented, in no acute distress. The patient noticed he had hematuria 2 weeks ago so he discontinued aspirin, but the patient's hematuria has been worsening for the past 1 week, especially for the past 3 days. He noticed blood clot in the urine. In addition, the patient has urine retention for 3 days. The patient has urinary frequency and less urine, but denies any dysuria. No fever or chills. The patient and his wife mentions that the patient had bladder cancer with bone metastasis status post chemotherapy. The patient denies any other symptoms.   PAST MEDICAL HISTORY:  1.  Right hip and pubic fracture.  2.  A-fib.  3.  Bladder cancer with bone metastasis. 4.  CKD stage III.   SOCIAL HISTORY: No smoking or drinking or illicit drugs.   FAMILY HISTORY: Father died of prostate cancer. Mother died of complications of kidney and liver failure.   PAST SURGICAL HISTORY: Hernia repair, cystoscopy, laser vaporization of the prostate.   ALLERGIES: PENICILLIN.   HOME MEDICATIONS:  1.  Vitamin C 250 mg p.o. daily. 2.  Vitamin B 100 one tablet p.o. daily. 3.  Multivitamin 1 tablet p.o. daily. 4.  Amiodarone 200 mg once daily.   REVIEW OF SYSTEMS:  CONSTITUTIONAL: The patient denies any fever or chills. No headache or dizziness. No weakness.  EYES: No double vision.  ENT: No postnasal drip, slurred speech or dysphagia.  CARDIOVASCULAR: No chest pain, palpitation, orthopnea, nocturnal dyspnea. No leg edema.   PULMONARY: No cough, sputum, shortness of breath or hemoptysis.  GASTROINTESTINAL: No abdominal pain, nausea, vomiting or diarrhea. No melena or bloody stool.  GENITOURINARY: No dysuria but has hematuria and urinary frequency and urinary retention.  ENDOCRINE: No polyuria, polydipsia, heat or cold intolerance.  SKIN: No rash or jaundice.  HEMATOLOGIC: No easy bruising or bleeding.  NEUROLOGIC: No syncope, loss of consciousness or seizure.   PHYSICAL EXAMINATION: VITAL SIGNS: Temperature 98, blood pressure 123/74, pulse 96, O2 saturation 96% in room air.  GENERAL: The patient is alert, awake, and oriented, in no acute distress.  HEENT: Pupils round, equal and reactive to light and accommodation. Moist oral mucosa. Clear oropharynx.  NECK: Supple. No JVD or carotid bruits. No lymphadenopathy. No thyromegaly.  CARDIOVASCULAR: S1 and S2. Regular rate, rhythm. No murmurs or gallops.  PULMONARY: Bilateral air entry. No wheezing or rales. No use of accessory muscles to breathe.  ABDOMEN: Soft. No distention or tenderness. No organomegaly. Bowel sounds present.  EXTREMITIES: No edema, clubbing or cyanosis. No calf tenderness. Bilateral pedal pulses present.  SKIN: No rash or jaundice.  NEUROLOGIC: Alert and oriented x3. No focal deficit. Power 5/5. Sensation intact.   DIAGNOSTIC DATA: Laboratory: WBC 9.6, hemoglobin 11, platelets 282,000. Glucose 108, BUN 32, creatinine 2.59, sodium 136, potassium 4.5, chloride 108, bicarb 21. Urinalysis showed hematuria.   CAT scan of abdomen and pelvis on March 10th showed progression of metastatic disease from presumably bladder carcinoma.  IMPRESSION:  1.  Hematuria and urinary retention due to  bladder cancer with metastasis.  2.  Acute renal failure with chronic kidney disease.  3.  Atrial fibrillation.  4.  Anemia.   PLAN OF TREATMENT: 1.  The patient was started with a CBI in ED. We will request a urology consult an oncology consult.  2.  We will  give normal saline IV. Follow up BMP and CBC.  3.  For atrial fibrillation, will continue amiodarone, but no anticoagulation due to hematuria.   I discussed the patient's condition and plan of treatment with the patient and the patient's wife. The patient wants FULL code.   TIME SPENT: About 55 minutes.   ____________________________ Demetrios Loll, MD qc:sb D: 12/03/2013 15:39:35 ET T: 12/03/2013 16:08:21 ET JOB#: [id number]  cc: Demetrios Loll, MD, <Dictator> Demetrios Loll MD ELECTRONICALLY SIGNED 12/03/2013 16:42

## 2015-01-11 NOTE — Consult Note (Signed)
Nephrostogram was reviewed and the tube is in good position.  No filling defects are noted.  The mid, distal ureter and bladder was not scanned. The bloody urine in his right nephrostomy appears old. No new recommendations.  His wife states he has an appointment at [facility] today at 2:45 for a second opinion and they would like to make this appointment.  Electronic Signatures: Abbie Sons (MD)  (Signed on 23-Apr-15 08:15)  Authored  Last Updated: 23-Apr-15 08:15 by Abbie Sons (MD)

## 2015-01-11 NOTE — Consult Note (Signed)
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   HPI   Patient last seen in clinic on November 22, 2013 for Xgeva.  He was feeling well until several days ago when he developed hematuria and difficulty urinating so complains of increased right hip pain.  He continues to have a mild peripheral neuropathy, but states it does not affect his day-to-day activity.  He does not complain of nausea or vomiting today.  He has a good appetite and his weight has remained stable.  He denies any recent fevers. He has no chest pain or shortness of breath. He has no urinary complaints.  Patient offers no further specific complaints today.  PFSH:  Additional Past Medical and Surgical History Non-invasive bladder cancer, kidney stones.  Bladder and prostate surgery, hernia repair.  Social history: Patient denies tobacco or alcohol.  Family history: Prostate cancer.   Review of Systems:  Performance Status (ECOG) 2   Review of Systems   As per HPI. Otherwise, 10 point system review was negative.   NURSING NOTES: **Vital Signs.:   16-Mar-15 09:31   Vital Signs Type: Admission   Temperature Temperature (F): 98   Celsius: 36.6   Temperature Source: oral   Pulse Pulse: 69   Respirations Respirations: 18   Systolic BP Systolic BP: 790   Diastolic BP (mmHg) Diastolic BP (mmHg): 74   Mean BP: 90   Pulse Ox % Pulse Ox %: 96   Pulse Ox Activity Level: At rest   Oxygen Delivery: Room Air/ 21 %   Physical Exam:  Physical Exam General: Well-developed, well-nourished, no acute distress. Eyes: Pink conjunctiva, anicteric sclera. HEENT: Normocephalic, moist mucous membranes, clear oropharnyx. Lungs: Clear to auscultation bilaterally. Heart: Regular rate and rhythm. No rubs, murmurs, or gallops. Abdomen: Soft, nontender, nondistended. No organomegaly noted, normoactive bowel sounds. Musculoskeletal: No  edema, cyanosis, or clubbing. Neuro: Alert, answering all questions appropriately. Cranial nerves grossly intact. Skin: No rashes or petechiae noted. Psych: Normal affect. Lymphatics: No cervical, calvicular, axillary or inguinal LAD.    Penicillin: Rash, Swelling    amiodarone 200 mg oral tablet: 1 tab(s) orally once a day, Status: Active, Quantity: 0, Refills: None   Vitamin B-100 Vitamin B Complex with C oral tablet, extended release: 1 tab(s) orally once a day, Status: Active, Quantity: 0, Refills: None   multivitamin: 1 tab(s) orally once a day, Status: Active, Quantity: 0, Refills: None   Vitamin C 250 mg oral tablet, chewable: 1 tab(s) orally once a day, Status: Active, Quantity: 0, Refills: None  Laboratory Results: Routine Chem:  16-Mar-15 05:45   Glucose, Serum  108  BUN  32  Creatinine (comp)  2.59  Sodium, Serum 136  Potassium, Serum 4.5  Chloride, Serum  108  CO2, Serum 21  Calcium (Total), Serum  7.9  Anion Gap 7  Osmolality (calc) 279  eGFR (African American)  28  eGFR (Non-African American)  24 (eGFR values <73m/min/1.73 m2 may be an indication of chronic kidney disease (CKD). Calculated eGFR is useful in patients with stable renal function. The eGFR calculation will not be reliable in acutely ill patients when serum creatinine is changing rapidly. It is not useful in  patients on dialysis. The eGFR calculation may not be applicable to patients at the low and high extremes of body sizes, pregnant women, and vegetarians.)  Routine Hem:  16-Mar-15 05:45   WBC (CBC) 9.6  RBC (CBC)  3.94  Hemoglobin (CBC)  11.0  Hematocrit (CBC)  35.0  Platelet Count (CBC) 282 (Result(s) reported on 03 Dec 2013 at 05:57AM.)  MCV 89  MCH 28.1  MCHC  31.6  RDW  16.6   Radiology Results: CT:    10-Mar-15 11:57, CT Abdomen and Pelvis W/WO Contrast  CT Abdomen and Pelvis W/WO Contrast   REASON FOR EXAM:    LAB 1st hematuria  COMMENTS:       PROCEDURE: KCT - KCT  ABDOMEN/PELVIS W/WO  - Nov 27 2013 11:57AM     CLINICAL DATA:  Hematuria for 1 week. History of bladder carcinoma.  This was treated with surgery, radiation therapy and chemotherapy  for years ago.    EXAM:  CT ABDOMEN AND PELVIS WITHOUT AND WITH CONTRAST    TECHNIQUE:  Multidetector CT imaging of the abdomen and pelvis was performed  without contrast material in one or both body regions, followed by  contrast material(s)and further sections in one or both body  regions.    CONTRAST:  80 mL of Isovue 370 intravenous contrast.    COMPARISON:  PET-CT, 10/23/2013.  Pelvis CT, 04/22/2013.    FINDINGS:  Bladder mass is similar to the recent PET-CT. Is heterogeneous  contiguous with the left prostate extending along the posterior left  wall of the bladder. It measures approximately 3.8 cm from anterior  to posterior 2 cm from right to left and estimated at 4.5 cm from  superior to inferior. Hip involves the left aspectthe seminal  vesicles and extends beyond the confines of the bladder and to the  left posterior left lateral perivesicular fat.  There is a 13 mm left external iliac chain lymph node, an 8 mm  posterior right external iliac chain lymph node, and several smaller  pelvic lymph nodes. There is a borderline enlarged mesenteric lymph  node at the level of the aortic bifurcation measuring 1 cm in short  axis.    There are low-attenuation liver lesions not evident on the prior  PET-CT There are several of these at the dome of the right and left  lobes of the liver with several along the inferior margin of the  posterior segment of the right lobe. The most inferior lesion  measures 15 mm in size. These have somewhat ill-defined margins. A  single small leads in mildly bulges the anterior contour of the  lateral segment of the left lobe.    The expansile lytic lesion of the inferior ischium and the more  subtle expansile lesion of the ischium along the posterior  medial  right acetabulum are stablefrom the prior PET-CT. No other bone  lesion suspicious for neoplastic disease.    No lung base nodules. Normal spleen, gallbladder and pancreas. No  bile duct dilation. No adrenal masses.    Multiple low-density renal masses consistent with bilateral cysts.  No evidence of an enhancing renal mass. Small nonobstructing stone  in the lower pole of the right kidney. No hydronephrosis. Normal  ureters.    Atherosclerosis is noted throughout the abdominal aorta and its  branch vessels. Diffuse abdominal aortic ectasia. Aorta measures 2.6  cm x 2.8 cm in its infrarenal portion.  Scattered colonic diverticula. Bowel is otherwise unremarkable.  Normal appendix is visualized.     IMPRESSION:  1. Progression of metastatic disease from presumed bladder  carcinoma.  2. Primary neoplastic mass involves the left lateral and left  posterior bladder and is contiguous with the left prostate and left  seminal vesicle. It  extends beyond the confines of the bladder  towards the left pelvic side wall.  3. There is metastatic adenopathy, the largest node a left external  iliac chain node measuring 13 mm in short axis.  4. There are low-density liver lesions that were not evident on the  recent prior PET-CT. These are presumed to be hepatic metastatic  disease.  5. Right ischial bone lesions are stable from the prior PET-CT  consistent with osseous metastatic disease. No new bone lesions.      Electronically Signed    By: Lajean Manes M.D.    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 Plan:   1.  Bladder cancer:  Patient last received chemotherapy in October 2014. CT scan reveals possible progressive disease in his liver. Patient has a previously scheduled followup appointment on December 21, 2013 at which point  we can discuss reinitiating chemotherapy. No further intervention needed at this time from an oncology standpoint. Renal insufficiency: Creatinine worse than baseline, likely due to bladder obstruction. Hematuria: Likely secondary to his known bladder cancer, bladder irrigation ongoing.  Appreciate urology input. Anemia: Despite his hematuria, patient's hemoglobin has remained relatively stable.  Monitor. Pathologic fracture to right hip: Patient has completed XRT.   Electronic Signatures: Delight Hoh (MD)  (Signed 16-Mar-15 12:27)  Authored: HISTORY OF PRESENT ILLNESS, PFSH, ROS, NURSING NOTES, PE, ALLERGIES, HOME MEDICATIONS, LABS, OTHER RESULTS, ASSESSMENT AND PLAN   Last Updated: 16-Mar-15 12:27 by Delight Hoh (MD)

## 2015-01-11 NOTE — Op Note (Signed)
PATIENT NAME:  Cody Barry, Cody Barry MR#:  [id number] DATE OF BIRTH:  September 05, 1946  DATE OF PROCEDURE:  12/04/2013  PREOPERATIVE DIAGNOSIS: Clot retention secondary to unknown cause.   POSTOPERATIVE DIAGNOSIS: Clot retention secondary to unknown cause.   PROCEDURE: Cysto with attempted clot evacuation.   COMPLICATIONS:  Patient went into ventricular fibrillation and had to be coded and sent to ICU.  So, because of this, the procedure was terminated.    PROCEDURE IN DETAIL:  The 61 French 3-way hematuria irrigation tube was inserted into the bladder.  Irrigation is now clear.  Some clot had been taken out of the bladder.  There still was clot present, but it will have to be lysed utilizing the catheter.  So patient is now on his way to ICU for overnight care.  He is in sinus rhythm after the code.    ____________________________ Janice Coffin. Elnoria Howard, [place]og[place]ell rdh:mk D: 12/04/2013 19:28:49 ET T: 12/04/2013 20:40:18 ET JOB#: [id number]  cc: Janice Coffin. Elnoria Howard, DO, <Dictator> Tierra Thoma D Arion Shankles DO ELECTRONICALLY SIGNED 12/06/2013 17:35

## 2015-01-11 NOTE — Consult Note (Signed)
ONCOLOGY followup note -still weak, mostly resting in bed. Continues to have hematuria from nephrostomy tube.eating steady. No pain issues.weak, resting in bed, otherwise alert and oriented converses appropriately. NAD.          vitals - 97.7, 82, 20, 119/60, 97% on room air          lungs - bilaterally good air entry          abdomen - soft, nontenderWBC 12200, ANC 80998, platelets 230, hemoglobin 8.3 (was 7.7 yesterday).  Recurrent stage IV metastatic bladder cancer with poor performance status (currently ECOG 3 at least), status post multiple kinds of chemotherapy in the past, with most recent CT scan in March showing progression of metastatic disease with liver lesions and in the bladder. The patient was last seen by Dr. Grayland Ormond in March and deemed a poor candidate for chemotherapy at that time. His performance status continues to be poor. I have explained that it is unlikely that he is a good candidate to continue on aggressive chemotherapy regimens and also, the chance of chemotherapy working would be quite small. The patient still wants to wait, if possible, until he meets with Dr. Grayland Ormond on April 30 and then make decision whether he will take further treatment or not. He continues to have issues with hematuria, per discussion that hospitalist Dr.Konidena who has discussed with the urologist Dr. Eliberto Ivory no further intervention available. Patient also wants to see urologist Dr. Bernardo Heater since he has had procedure by him in the past, and is awaiting his evaluation. Agree with supportive transfusion as indicated. Palliative care also is following. The patient states that he understands malignancy is incurable and likelihood of any treatment working is small but he wants to exhaust all options before considering hospice. He also wants to remain full code at this time. No major pain issues at this time. Agree with ongoing supportive treatment and medications. If he is discharged soon, we can arrange close  monitoring of hemoglobin as outpatient and transfuse as indicated until he meets with Dr. Grayland Ormond who is his primary oncologist. In between, if condition declines, I have recommended that they consider initiating hospice. Patient and wife present are agreeable to this plan.   Electronic Signatures: Jonn Shingles (MD)  (Signed on 20-Apr-15 21:03)  Authored  Last Updated: 20-Apr-15 21:03 by Jonn Shingles (MD)

## 2015-01-11 NOTE — H&P (Signed)
PATIENT NAME:  Cody Barry, Cody Barry MR#:  [id number] DATE OF BIRTH:  1945-09-24  DATE OF ADMISSION:  01/04/2014  REFERRING PHYSICIAN:  Patient is transferred from [facility].  CHIEF COMPLAINT: Gross hematuria.   PRIMARY CARE PHYSICIAN: Viviana Simpler, MD   HISTORY OF PRESENT ILLNESS: This is a 69 year old male with known history of stage IV bladder cancer with bone metastasis, being followed by Doctors Bernardo Heater and Grayland Ormond. The patient had a lengthy hospital stay recently in March of last year where he had bilateral nephrostomy tubes for bilateral hydronephrosis. The patient presented earlier to [facility] for hematuria but he left due to a long wait, but then he went to [facility] and as well, the patient left [facility] due to a long wait. Then he went to [facility] where he was evaluated for his hematuria. The patient had CT abdomen and pelvis which did not show any evidence of hydronephrosis, did show evidence of bladder cancer and blood clots in the bladder. As well, the patient had urinalysis sent from both of his nephrostomy tubes and a urine sample which came back positive for leukocyte esterase and too numerous white blood cells to count, so [facility] requested admission at [facility] for further treatment. As well, the patient is known to have baseline anemia. His hemoglobin has dropped, as per [facility], his hemoglobin was at 7.3. He is known to have a history of chronic kidney disease, baseline creatinine around 2, which remains the same. The patient denies any fever, any chills, any productive sputum, any coffee-ground emesis, any melena or bright red blood per rectum, any abdominal pain. The patient had a bladder scan done here which did show only 45 mL of post residual urine volume.   PAST MEDICAL HISTORY: 1. Atrial fibrillation,  2. Bladder cancer with bone metastasis.  3. Chronic kidney disease, stage 3.   4. Right hip and pubic fracture.  5. Anemia.  6. Bilateral hydronephrosis, status post bilateral nephrostomy tubes.  7. Anemia.   SOCIAL HISTORY: No smoking. No drinking. No illicit drugs.   FAMILY HISTORY: Significant for prostate cancer in father.   PAST SURGICAL HISTORY: Hernia repair, cystoscopy, laser vaporization of the prostate and bilateral nephrostomy tubes.   ALLERGIES: PENICILLIN.   HOME MEDICATIONS: 1. Percocet as needed.  2. Palladone opium one inserted rectally every 8 hours as needed.  3. Amiodarone 200 mg oral daily.  4. Multivitamin 1 tablet daily.  5. Vitamin B 100, vitamin B complex oral 1 tablet daily.  6. Vitamin C 250 mg oral daily.   REVIEW OF SYSTEMS:  CONSTITUTIONAL: The patient denies fever, chills, fatigue, weakness, weight gain, weight loss.  EYES: Denies blurry vision, double vision, inflammation, glaucoma.  ENT: Denies tinnitus, ear pain, hearing loss, epistaxis or discharge.  RESPIRATORY: Denies cough, wheezing, hemoptysis, dyspnea. CARDIOVASCULAR: Denies chest pain, edema, palpitations, syncope.  GASTROINTESTINAL: Denies nausea, vomiting, diarrhea, abdominal pain, hematemesis.  GENITOURINARY: Denies dysuria, renal colic. Reports hematuria. Has bilateral nephrostomies.  ENDOCRINE: Denies polyuria, polydipsia, heat or cold intolerance.  HEMATOLOGY: Denies easy bruising, bleeding diathesis. Reports history of anemia. INTEGUMENTARY: Denies acne, rash or skin lesion.  MUSCULOSKELETAL: Denies any swelling, gout, arthritis, cramps. NEUROLOGIC: Denies CVA, TIA, vertigo, tremors, ataxia.  PSYCHIATRIC: Denies anxiety, insomnia or depression.   PHYSICAL EXAMINATION: VITAL SIGNS: Temperature 98.2, pulse 84, respiratory rate 20, blood pressure 123/65, saturating 97% on room air.  GENERAL: Elderly male who looks comfortable in  bed, in no apparent distress.  HEENT: Head is atraumatic, normocephalic. Pupils equal, reactive to light. Pink conjunctivae.  Anicteric sclerae. Moist oral mucosa.  NECK: Supple. No thyromegaly. No JVD.  CHEST: Good air entry bilaterally. No wheezing, rales or rhonchi.  CARDIOVASCULAR: S1, S2 heard. No rubs, murmurs or gallops.  ABDOMEN: Soft, nontender, nondistended. Bowel sounds present. Has bilateral nephrostomy tubes. Sites look clean. No oozing, no drainage. Has right nephrostomy tube with clear urine in the tube. Left nephrostomy tube with bloody urine in the tube. No suprapubic tenderness.  EXTREMITIES: No edema. No clubbing. No cyanosis.  PSYCHIATRIC: Appropriate affect. Awake, alert x3. Intact judgment and insight.  NEUROLOGIC: Cranial nerves grossly intact. Motor 5/5. No focal deficits. Pedal and radial pulses +2 bilaterally.  MUSCULOSKELETAL: No joint effusion or erythema.  SKIN: Normal skin turgor. Warm and dry. Pale.   PERTINENT LABORATORY DATA: Glucose 95, creatinine 1.92, sodium 132, potassium 4.9, chloride 110, CO2 of 22. ALT 54, AST 73, alkaline phosphatase 373. White blood cells 14.6, hemoglobin 7.9, hematocrit 24.7, platelets 464. These are the labs which were done earlier during the day before patient went to [facility]. As of [facility]'s records, the patient's last hemoglobin is 7.3 and the urinalysis has all specimens from bilateral nephrostomies and urine sample showing positive leukocyte esterase and too numerous white blood cells to count.   ASSESSMENT AND PLAN: 1. Gross hematuria, as well bloody urine in the left hydronephrosis drain, this is most likely related to his bladder cancer, but the patient does not appear to be having any urinary retention. Will consult urology and oncology service for further recommendations.  2. Anemia, acute on chronic. The patient has active bleeding  due to his hematuria and he reports some lethargy and weakness. Will transfuse 1 unit of packed red blood cells. Will hold chemical anticoagulation at this point.  3. Positive urinalysis. Unclear if patient is having a  urinary tract infection or not. He is afebrile, but does have leukocytosis. Will await urine culture. Will continue with Rocephin for the time being.  4. Chronic kidney disease, appears to be at baseline. We will continue gentle hydration. 5. Atrial fibrillation, rate controlled. Continue with amiodarone. Certainly the patient is not a candidate for anticoagulation. For DVT prophylaxis, sequential compression device and Venodyne.  6. CODE STATUS: Discussed with the patient. He does not have a living will, no healthcare power of attorney. He reports he is a FULL CODE.   TOTAL TIME SPENT ON ADMISSION AND PATIENT CARE: 50 minutes.    ____________________________ Albertine Patricia, MD dse:dr D: 01/04/2014 04:07:03 ET T: 01/04/2014 05:03:47 ET JOB#: [id number]  cc: Albertine Patricia, MD, <Dictator> Jamahl Lemmons Graciela Husbands MD ELECTRONICALLY SIGNED 01/04/2014 23:44

## 2015-01-11 NOTE — Discharge Summary (Signed)
PATIENT NAME:  Cody Barry, Cody Barry MR#:  [id number] DATE OF BIRTH:  September 04, 1946  DATE OF ADMISSION:  01/05/2014 DATE OF DISCHARGE:  01/10/2014  ADMITTING DIAGNOSIS: Hematuria.   DISCHARGE DIAGNOSES: 1.  Gross hematuria from the right nephrostomy tube, status post nephrostogram.   2. Acute posthemorrhagic anemia, status post 2 units of packed red blood cell transfusion, suspected urinary tract infection.  3. History of recent bilateral ureteral obstruction due to tumor, status post percutaneous nephrostomy tube placement.  4.  Transurethral resection of bladder tumor.  5.  Fulguration.  6.  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  7.  Atrial fibrillation. 8.  Chronic kidney disease, stage 3.  9.  Anxiety.  10.  Right pubic hip fracture.  11.  Bilateral hydronephrosis.  12.  Bilateral nephrostomy tubes.   DISCHARGE CONDITION: Guarded.   DISCHARGE MEDICATIONS: The patient is to continue vitamin B complex with C oral vitamins once daily, multivitamins once daily, vitamin C 250 mg p.o. daily, Belladonna opium 1 suppository every 8 hours as needed, amiodarone 200 mg p.o. daily, acetaminophen/oxycodone 325/5 mg 1 tablet every six hours as needed, alprazolam 0.5 mg twice daily, Zofran ODT 4 mg 3 times daily as needed, senna 1 tablet twice daily as needed, docusate sodium 100 mg twice daily as needed, Ensure 240 mL 4 times daily and iron gluconate 240 mg twice daily.   HOME OXYGEN: At 2 liters of oxygen through nasal cannula with portable tank.   DIET: Regular, mechanical soft.   ACTIVITY LIMITATIONS: As tolerated.   REFERRAL: To hospice at home.   FOLLOWUP APPOINTMENT:  With Dr. Silvio Pate in two days after discharge.   CONSULTANTS: Care management, social work,  Dr. Bernardo Heater , Dr. Yves Dill, Dr. Ma Hillock, Dr. Ermalinda Memos.   RADIOLOGIC STUDIES:  Fluoroscopy for nephrostomy check 27 of April, 2015, revealing normal exam and no leak. Right nephrostomy tube is in good anatomic position.    HISTORY OF PRESENT ILLNESS:  The patient is a 69 year old Caucasian male with past medical history significant for history of transitional bladder carcinoma who presents to the hospital with gross hematuria from the right nephrostomy bag. Please refer to Dr. Graciela Husbands admission note on 17 of April, 2015. On arrival to the hospital, the patient's temperature was 98.2, pulse was 84, respiratory rate was 20, blood pressure 123/65, saturation was 97% on room air. Physical exam revealed right nephrostomy tube with bloody urine on the left, he had no blood.  Otherwise. physical exam was unremarkable. The patient's lab data done in the Emergency Room on 16 of April, 2015, revealed elevation of BUN and creatinine to 40 and 1.92; otherwise, BMP was unremarkable. The patient's liver enzymes revealed albumin level of 2.1, alkaline phosphatase 373, AST of 73; otherwise, liver enzymes were normal. White blood cell count was elevated to 14.6, hemoglobin was 7.9, platelet count was 464. The patient's urinalysis revealed a red blood in urine, specific gravity 1.0 to 1; otherwise, it was impossible to evaluate because of significant amount of blood. There was 70,397 red blood cells and 888 white blood cells.   HOSPITAL COURSE: The patient was admitted to the hospital for further evaluation. He was typed and screened because of his severe acute posthemorrhagic anemia, and was transfused with 2 units of packed cells. However, even with transfusion, the patient's hemoglobin fluctuated between 7.9 to 8.3. He was rehydrated and his kidney function somewhat improved by the 23rd of April, 2015. It was 1.58, with BUN level of 31. He was evaluated  by Dr. Yves Dill as well as Dr. Ma Hillock, as well as Dr. Bernardo Heater. The patient under a nephrostogram and was found to have the tube in good position. No filling defects were noted. Mid distal ureter, as well as bladder, was not scanned. The bloody urine in his right nephrostomy appeared old,  according to Dr. Bernardo Heater. He recommended nothing new, except hospice care. However, the patient's wife noted and arranged appointment at [facility] for a second opinion and would like to maintain that appointment. The patient was noted to be anemic with a hemoglobin level of 7.9 on the 23rd of  April, 2015. We tried to arrange a transfusion, however, it was not possible to be done before the patient needed to leave to see [facility] urologist. He is being discharged to home with hospice. His vital signs on the day of discharge, temperature was 98.1, pulse was 96, respiratory rate was 20, blood pressure 112/64. Saturation was 94% on room air at rest.   In regards to suspected urinary tract infection, the patient was initially treated with antibiotic therapy; however, and, unfortunately, his urine cultures were not taken. The patient received antibiotics. He had no symptomatology in regards to infection, so, it was felt that the patient's elevated white blood cell count in urine was very likely due to bleeding.   In regards to chronic medical problems, such as atrial fibrillation, chronic kidney disease,  anxiety; the patient is to continue his outpatient management. No changes were made.   The patient is being discharged in guarded condition with the above-mentioned medications and follow-up.   Time Spent: 40 minutes.   ____________________________ Theodoro Grist, MD rv:aj D: 01/10/2014 18:02:13 ET T: 01/11/2014 05:22:55 ET JOB#: [id number]  cc: Theodoro Grist, MD, <Dictator> Venia Carbon, MD [place] MD ELECTRONICALLY SIGNED 01/19/2014 18:51

## 2015-01-11 NOTE — Consult Note (Signed)
Chief Complaint:  Subjective/Chief Complaint No complaints this morning.  The majority of his urinary output is via nephrostomies.  He has had minimal blood or urine per urethra.   VITAL SIGNS/ANCILLARY NOTES: **Vital Signs.:   23-Mar-15 04:15  Temperature Temperature (F) 98.4  Pulse Pulse 73  Systolic BP Systolic BP 997  Diastolic BP (mmHg) Diastolic BP (mmHg) 68  *Intake and Output.:   23-Mar-15 01:30  Other Output ml     Out:  550  Other Output ml     Out:  375    05:55  Other Output ml     Out:  350  Other Output ml     Out:  200    Shift 07:00  Other Output ml     Out:  900  Other Output ml     Out:  575    Daily 07:00  Other Output ml     Out:  1850  Other Output ml     Out:  1325   Brief Assessment:  GEN no acute distress   Gastrointestinal details normal Soft   Lab Results: Routine Chem:  16-Mar-15 05:45   Creatinine (comp)  2.59  17-Mar-15 08:06   Creatinine (comp)  2.45  18-Mar-15 03:37   Creatinine (comp)  2.77  19-Mar-15 05:29   Creatinine (comp)  3.99    16:28   Creatinine (comp)  4.36  20-Mar-15 04:42   Creatinine (comp)  5.24  21-Mar-15 09:43   Creatinine (comp)  5.45  22-Mar-15 05:27   Creatinine (comp)  4.29  23-Mar-15 05:47   Glucose, Serum 85  BUN  42  Creatinine (comp)  2.90  Sodium, Serum 140  Potassium, Serum 3.6  Chloride, Serum 107  CO2, Serum 27  Calcium (Total), Serum  6.6  Anion Gap  6  Osmolality (calc) 289  eGFR (African American)  24  eGFR (Non-African American)  21 (eGFR values <79mL/min/1.73 m2 may be an indication of chronic kidney disease (CKD). Calculated eGFR is useful in patients with stable renal function. The eGFR calculation will not be reliable in acutely ill patients when serum creatinine is changing rapidly. It is not useful in  patients on dialysis. The eGFR calculation may not be applicable to patients at the low and high extremes of body sizes, pregnant women, and vegetarians.)  Result Comment  CALCIUM - NOTIFIED OF CRITICAL VALUE  - RESULTS VERIFIED BY REPEAT TESTING.  - CALLED TO CHRISTINE WELCH AT 7414 ON  - 12/10/13.Marland KitchenMarland KitchenTPL  - READ-BACK PROCESS PERFORMED.  Result(s) reported on 10 Dec 2013 at [facility].  Routine Hem:  23-Mar-15 05:47   WBC (CBC) 7.4  RBC (CBC)  2.68  Hemoglobin (CBC)  7.9  Hematocrit (CBC)  23.4  Platelet Count (CBC) 225  MCV 87  MCH 29.4  MCHC 33.8  RDW  16.0  Neutrophil % 71.5  Lymphocyte % 8.8  Monocyte % 16.7  Eosinophil % 2.6  Basophil % 0.4  Neutrophil # 5.3  Lymphocyte #  0.6  Monocyte #  1.2  Eosinophil # 0.2  Basophil # 0.0 (Result(s) reported on 10 Dec 2013 at [facility].)   Assessment/Plan:  Assessment/Plan:  Assessment Significant improvement in creatinine after bilateral nephrostomy placement. Hematuria significantly improved with nephrostomy placement.   Plan Continue nephrostomy drainage. Will tentatively plan cystoscopy 3/24.   Electronic Signatures: Abbie Sons (MD)  (Signed 23-Mar-15 08:29)  Authored: Chief Complaint, VITAL SIGNS/ANCILLARY NOTES, Brief Assessment, Lab Results, Assessment/Plan   Last Updated: 23-Mar-15 08:29 by John Giovanni  C (MD)

## 2015-01-11 NOTE — Consult Note (Signed)
PATIENT NAME:  Cody Barry, Cody Barry MR#:  [id number] DATE OF BIRTH:  23-Apr-1946  DATE OF CONSULTATION:  12/03/2013  REFERRING PHYSICIAN:   CONSULTING PHYSICIAN:  Janice Coffin. Elnoria Howard, DO  IMPRESSION: Gross hematuria secondary to clot, origin unknown. Metastatic bladder carcinoma, status post first surgery in 2008, transurethral resection bladder tumor, status ongoing chemotherapy beginning in 2013. He has had 2 years of ongoing chemotherapy. 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   HISTORY OF THE CHIEF COMPLAINT: He came into the Emergency Room last night with a clot. He has had off and on bleeding for at least a month or 2 now. He had a cystoscopic examination by Dr. Eliberto Ivory on Thursday. No recurrent tumor was seen in his bladder. The patient reports that the clot that they found the Emergency Room was a long, wormlike clot.   RECOMMENDATIONS: At this point continue irrigation, oncology consult, consult with Dr. Eliberto Ivory for ongoing care of this patient as this is his patient.  I will review the CT scan and discuss with the patient. Right now, there is nothing that needs to be done with this patient other than ongoing irrigation and then outpatient workup. No emergency surgery is necessary tonight as he is comfortable, eating and is not obstructed by the clot.   ALLERGIES:  PENICILLIN.   MEDICATIONS: Vitamin C 250 mg p.o. daily, vitamin B 100 mg daily, amiodarone 200 mg once daily.   PAST MEDICAL HISTORY:  History of right hip and pubic fracture, atrial fibrillation, bladder carcinoma with bony mets and chronic kidney disease stage 3.   SOCIAL HISTORY:  At this time, no smoking bit I am assuming he was a smoker.   FAMILY HISTORY: Father died of prostate cancer. Mother died of complications of renal failure and liver failure.   PAST SURGICAL HISTORY:  Herniorrhaphy, laser vaporization of the prostate, TUR bladder tumor.   REVIEW OF SYSTEMS:  CONSTITUTIONAL:  No fevers, chills, no headaches.   EYES: No double vision.  CARDIOVASCULAR: No chest pain, palpitations, orthopnea.  PULMONARY: No cough, sputum, shortness of breath.  GASTROINTESTINAL: No abdominal pain, nausea, vomiting, diarrhea.  No melena or bloody stool.  GENITOURINARY: No dysuria but, of course, he has had urinary retention which he does not have now, gross painless hematuria with clot.  ENDOCRINE: No polyuria or polydipsia, heat or cold intolerance.   PHYSICAL EXAM:  VITAL SIGNS:  Now are stable.  GENITOURINARY:  Foley catheter was viewed, it is a 3-way Foley, good urine output. No problem with his urethra, urethral meatus. Urine is light to red but with no clots. It is flowing well. Irrigation is a very low rate.  GENERALLY: The patient is alert, awake and oriented, in no acute distress.  HEENT:  Pupils are round and reactive to light.  NECK:  No JVD, carotid bruits.  CARDIOVASCULAR: Regular rate and rhythm.  PULMONARY: No rales, rhonchi or wheezing.  ABDOMEN: Soft. No distention at this time and he is no longer obstructed.   LABORATORY, DIAGNOSTIC AND RADIOLOGICAL DATA:  White count 9.6, hemoglobin 11, platelets 282,000. Glucose 108, BUN 32, creatinine 2.9. Wife states the right kidney is not working well, left is okay.   Sodium136, potassium 4.5. CAT scan on March 10th showed progression of metastatic disease from bladder tumor but because of renal failure we could not do a contrast study.   RECOMMENDATIONS:  I personally would recommend a left retrograde and right retrograde to see if the tumor is coming from  his ureter or his renal pelvis with bleeding. I would also recommend that Dr. Eliberto Ivory see the patient for followup.   ____________________________ Janice Coffin. Elnoria Howard, DO rdh:cs D: 12/03/2013 16:54:04 ET T: 12/03/2013 19:43:07 ET JOB#: [id number]  cc: Janice Coffin. Elnoria Howard, DO, <Dictator> Kyle Stansell D Rajeev Escue DO ELECTRONICALLY SIGNED 12/06/2013 17:35

## 2015-01-11 NOTE — Consult Note (Signed)
Scheduled for cystoscopy under anesthesia with possible TURBT in AM.  The procedure was discussed in detail with Cody Barry.  Potential complications discussed include but are not limited to bleeding, infection and rarely bladder perforation.  He indicated all questions were answered to his satisfaction and desires to proceed.  Electronic Signatures: Abbie Sons (MD)  (Signed on 23-Mar-15 18:46)  Authored  Last Updated: 23-Mar-15 18:46 by Abbie Sons (MD)

## 2015-01-11 NOTE — Consult Note (Signed)
Chief Complaint:  Subjective/Chief Complaint no complaints; hct stable   VITAL SIGNS/ANCILLARY NOTES: **Vital Signs.:   25-Mar-15 08:31  Temperature Temperature (F) 98.3  Pulse Pulse 81  Systolic BP Systolic BP 093  Diastolic BP (mmHg) Diastolic BP (mmHg) 70   Brief Assessment:  GEN no acute distress   Additional Physical Exam CBI effluent in bag was clear-cbi was off and urine in tubing pink   Lab Results: Routine Chem:  16-Mar-15 05:45   Creatinine (comp)  2.59  17-Mar-15 08:06   Creatinine (comp)  2.45  18-Mar-15 03:37   Creatinine (comp)  2.77  19-Mar-15 05:29   Creatinine (comp)  3.99    16:28   Creatinine (comp)  4.36  20-Mar-15 04:42   Creatinine (comp)  5.24  21-Mar-15 09:43   Creatinine (comp)  5.45  22-Mar-15 05:27   Creatinine (comp)  4.29  23-Mar-15 05:47   Creatinine (comp)  2.90  24-Mar-15 11:21   Creatinine (comp)  2.11  25-Mar-15 04:50   Creatinine (comp)  2.08   Assessment/Plan:  Assessment/Plan:  Assessment Metastatic bladder cancer with bilateral ureteral obstruction. Hematuria resolved   Plan d/c Foley OK for d/c from GU standpoint.  Will need to continue nephrostomy drainage   Electronic Signatures: Abbie Sons (MD)  (Signed 25-Mar-15 08:39)  Authored: Chief Complaint, VITAL SIGNS/ANCILLARY NOTES, Brief Assessment, Lab Results, Assessment/Plan   Last Updated: 25-Mar-15 08:39 by Abbie Sons (MD)

## 2015-01-11 NOTE — Consult Note (Signed)
Chart was reviewed.  He states his hematuria is improving.  His catheter was removed because of significant bladder spasms.  Estimated volumes by bladder scan have been 50-65 mL. Renal ultrasound performed today does show bilateral moderate hydronephrosis which is a significant change from his CT of 11/27/2013.  The bladder does not appear distended.  Cardiolite stress test was performed and he is felt to be low risk for anesthesia/cystoscopy.  Impression: 1.  Hematuria-by report he had a cystoscopy performed last week by Dr. Yves Dill which did not show tumor but was suspicious for radiation cystitis.  His CT 3/10 is suspicious for recurrent bladder tumor.  He has been cleared for cystoscopy however he had lunch at 1300 today.  2.  Acute acute on chronic renal failure- progressive increase in creatinine at 5.24 today.  There is no evidence of urinary retention on bladder scan or survey views of the bladder during his renal ultrasound today.  This is most likely secondary to extrinsic distal ureteral obstruction due to progressive bladder cancer.  3.  Metastatic bladder cancer  Recommendation: There is no evidence of clot retention.  Based on the appearance of his CT scan on 3/10 stent placement would be difficult and would most certainly worsen his hematuria.  There would be a high incidence of stent obstruction with his progressive bladder cancer.  Feel the best course of action would be placement of bilateral nephrostomy tubes which would address his renal failure and may lessen his hematuria by diverting the urine flow.  He will still need cystoscopy however not emergently.  He may require cystoscopy for progressive hematuria. Will check out to Dr. Valma Cava and will make him NPO after midnight in the event he has progressive hematuria.  Discussed with patient and Dr. Tressia Miners  Electronic Signatures: Abbie Sons (MD)  (Signed on 20-Mar-15 14:35)  Authored  Last Updated: 20-Mar-15 14:35 by  Abbie Sons (MD)

## 2015-01-11 NOTE — Consult Note (Signed)
Mr. Kosmicki has metastatic bladder cancer and was recently hospitalized for hematuria and bilateral ureteral obstruction secondary to tumor.  He had percutaneous nephrostomy tubes placed and underwent TURBT/fulguration which showed muscle invasive transitional cell carcinoma bladder.  He was recently admitted with recurrent hematuria.  He is primarily concerned about blood in his right nephrostomy tube.  He is having small amount of hematuria but states this has not been significant.  He is voiding only small amounts of urine.  He has been seen by Dr. Yves Dill who recommended palliative/supportive care.  The family has requested a second opinion.  Impression: Metastatic bladder cancer with recurrent hematuria  Recommendation: He is not having significant blood per urethra.  Would recommend a right nephrostogram to make sure his tube is in adequate position.  This will also assess for tumor within the ureter although this would not be able to be differentiated from clot. If he has recurrent hematuria per urethra consideration could be given for percutaneous embolization of the hypogastric arteries bilaterally and if this was not successful and could consider intravesical formalin.  Electronic Signatures: Abbie Sons (MD)  (Signed on 21-Apr-15 15:05)  Authored  Last Updated: 21-Apr-15 15:05 by Abbie Sons (MD)

## 2015-01-12 NOTE — Op Note (Signed)
PATIENT NAME:  Cody Barry, Cody Barry MR#:  [id number] DATE OF BIRTH:  01-07-1946  DATE OF PROCEDURE:  10/11/2011  PREOPERATIVE DIAGNOSIS: Bladder tumor.   POSTOPERATIVE DIAGNOSES:  1. Bladder tumor.  2. Bladder neck contracture.   PROCEDURES:  1. Transurethral resection of bladder tumor.  2. Transurethral incision of bladder neck contracture.   SURGEON: Maryan Puls, M.D.   ANESTHETIST: Dr. Marcello Moores.   ANESTHESIA: General.   INDICATIONS: See the dictated history and physical. After informed consent, the patient requests the above procedures.   OPERATIVE SUMMARY: After adequate general anesthesia had been obtained, the patient was placed into the dorsal lithotomy position and the perineum was prepped and draped in the usual fashion. The resectoscope was coupled with the camera and attempt was made to pass the scope pass the bladder neck, but the patient had approximately a 20 French bladder neck contracture. Therefore, the [employer] was selected and bladder neck contracture was incised at the three o'clock and nine o'clock position, eventually allowing entry into the bladder. The bladder was thoroughly inspected. The bladder was moderately trabeculated. The patient had a bladder tumor measuring approximately 7 mm located at the 10 o'clock position at the bladder neck. Using the loop, the tumor was resected. Superficial portions of the tumor were sent separately from deep portions. The base of the tumor was cauterized. At this point, the resectoscope was removed and a 20 French silicone catheter was placed. The catheter was irrigated until clear. A B and O     suppository was placed. The procedure was then terminated and the patient was transferred to the recovery room in stable condition.    ____________________________ Otelia Limes. Yves Dill, MD mrw:ap D: 10/11/2011 14:10:02 ET T: 10/11/2011 14:24:02 ET JOB#: [id number]  cc: Otelia Limes. Yves Dill, MD, <Dictator>  Royston Cowper MD ELECTRONICALLY  SIGNED 10/12/2011 17:50

## 2015-01-12 NOTE — H&P (Signed)
PATIENT NAME:  Cody Barry, Cody Barry MR#:  [id number] DATE OF BIRTH:  [birth year]  DATE OF ADMISSION:  10/11/2011  CHIEF COMPLAINT: Bladder tumor.   HISTORY OF PRESENT ILLNESS: Cody Barry is a 69 year old Caucasian male who presented to the office for surveillance cystoscopy January 16th and was found to have a 7 mm papillary tumor located at the bladder neck at the 10 o'clock position. He comes in now for transurethral resection of the bladder tumor. The patient has a history of recurrent superficial bladder tumors and has had three prior resections. He also has history of benign prostatic hypertrophy and underwent green light laser vaporization of the prostate in September of 2012. He has received a cycle of intravesical BCG treatments from July of 2011 until October of 2011. He received a second cycle of BCG in January of 2012 through August 2012. He received mitomycin intravesical treatment in August of 2012, September of 2012, and October of 2012.   ALLERGIES: Penicillin.   CURRENT MEDICATIONS:  1. Finasteride. 2. Aspirin.   PAST SURGICAL HISTORY:  1. Inguinal herniorrhaphy. 2. Tumor resections as above.   SOCIAL HISTORY: The patient denied tobacco or alcohol abuse.   FAMILY HISTORY: Noncontributory.   REVIEW OF SYSTEMS: The patient denied chest pain, heart disease, diabetes, stroke, or coronary artery disease.   PHYSICAL EXAMINATION:   GENERAL: Well nourished white male in no distress.   HEENT: Sclerae were clear. Pupils were equal, round, reactive to light and accommodation. Extraocular movements intact.   NECK: Supple. No palpable cervical adenopathy.   LUNGS: Clear to auscultation.   CARDIOVASCULAR: Regular rhythm and rate without audible murmurs or gallops.   ABDOMEN: Soft, nontender abdomen.   GU: Uncircumcised. Testes were atrophic.   RECTAL: 40 gram smooth nontender prostate.   NEUROMUSCULAR: Nonfocal.   IMPRESSION: Papillary bladder tumor.   PLAN: Transurethral  resection of bladder tumor.   ____________________________ Otelia Limes. Yves Dill, MD mrw:drc D: 10/06/2011 16:33:45 ET T: 10/06/2011 17:01:53 ET JOB#: [id number]  cc: Otelia Limes. Yves Dill, MD, <Dictator> Royston Cowper MD ELECTRONICALLY SIGNED 10/07/2011 12:41

## 2015-08-01 IMAGING — CT CT ABDOMEN AND PELVIS WITHOUT AND WITH CONTRAST
2 of 10 series · 10 of 46 positions shown, 16 images · IV contrast (isovue)
Comparison: PET-CT, 10/23/2013.  Pelvis CT, 04/22/2013.

CLINICAL DATA: Hematuria for 1 week. History of bladder carcinoma.
This was treated with surgery, radiation therapy and chemotherapy
for years ago.

EXAM:
CT ABDOMEN AND PELVIS WITHOUT AND WITH CONTRAST
TECHNIQUE: Multidetector CT imaging of the abdomen and pelvis was performed
without contrast material in one or both body regions, followed by
contrast material(s) and further sections in one or both body
regions.
CONTRAST:  80 mL of Isovue 370 intravenous contrast.

[Series 2: hematuria > 45 wo · axial · 0.91mm/px · z∈[-1008,-658]mm · 8 of 90 slices shown, 13 images]
[im 10/90  soft-tissue]
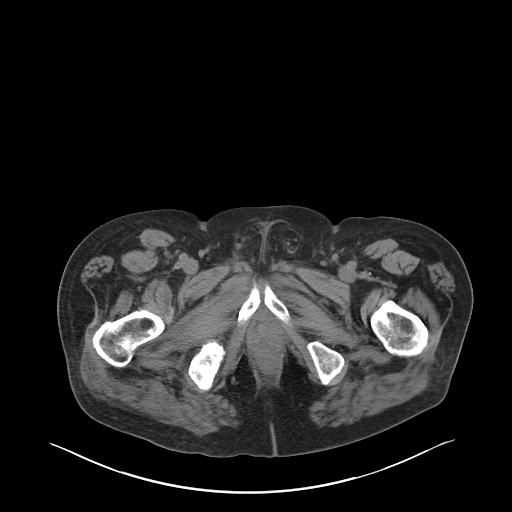
[im 10/90  bone]
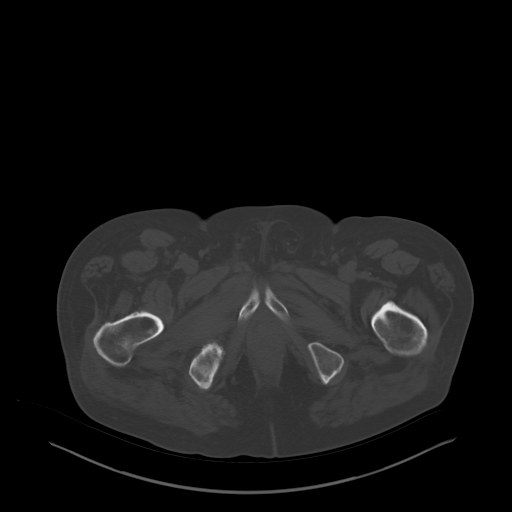
[im 20/90  soft-tissue]
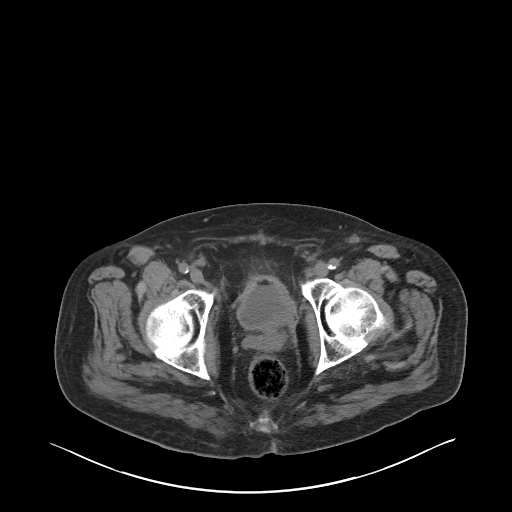
[im 30/90  soft-tissue]
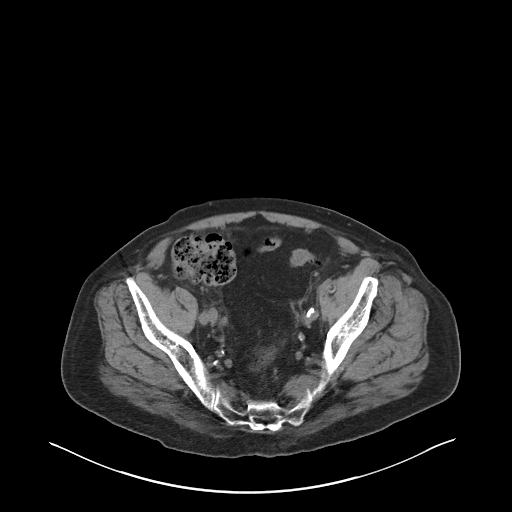
[im 40/90  soft-tissue]
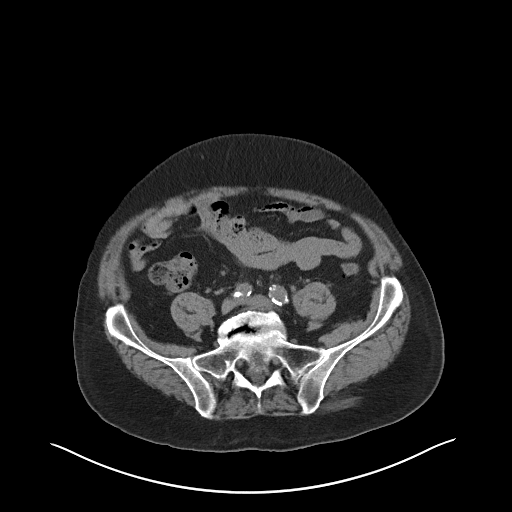
[im 50/90  soft-tissue]
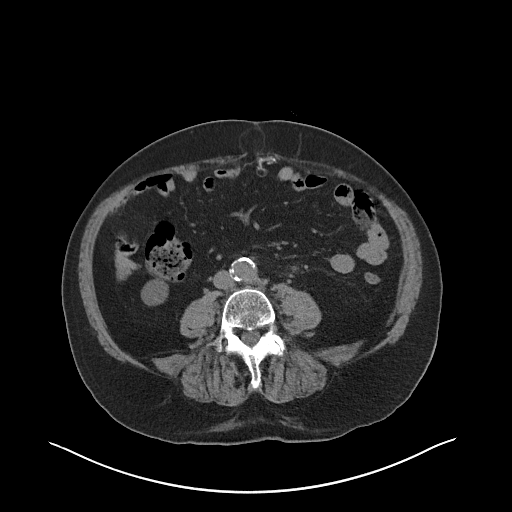
[im 50/90  lung]
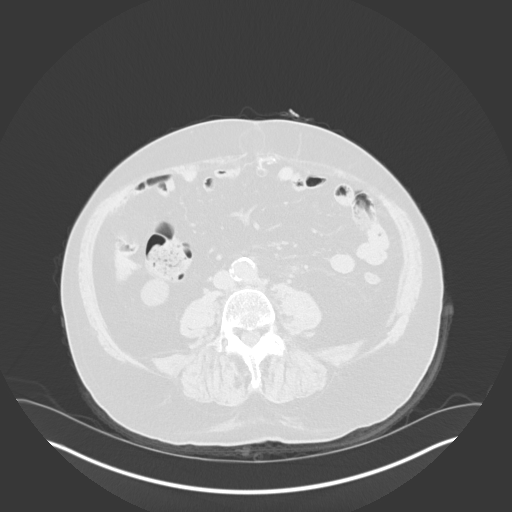
[im 60/90  soft-tissue]
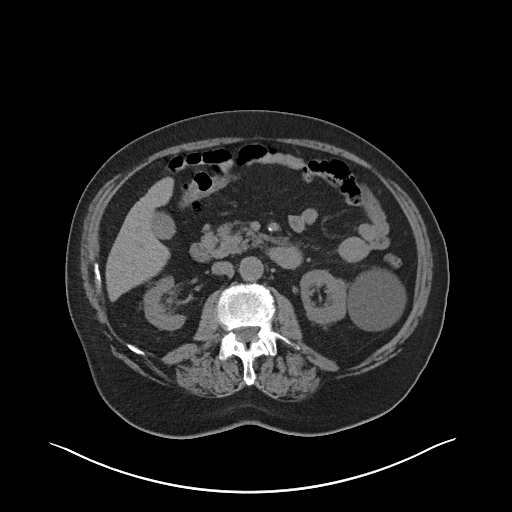
[im 60/90  lung]
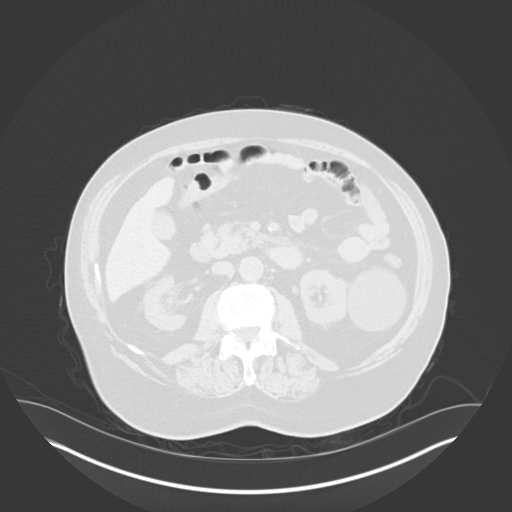
[im 70/90  soft-tissue]
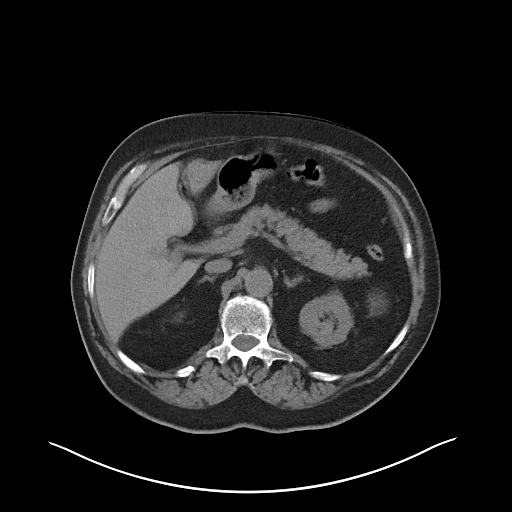
[im 70/90  lung]
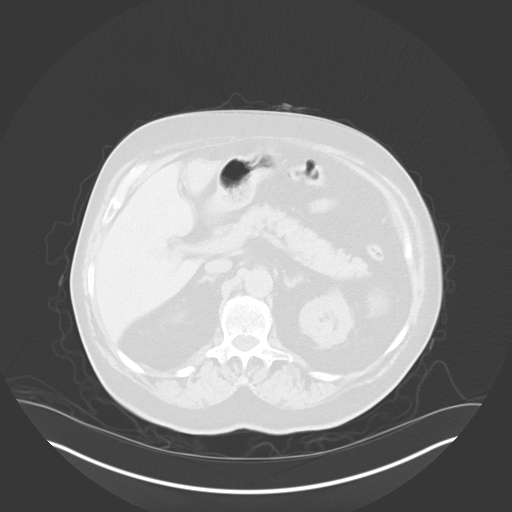
[im 80/90  soft-tissue]
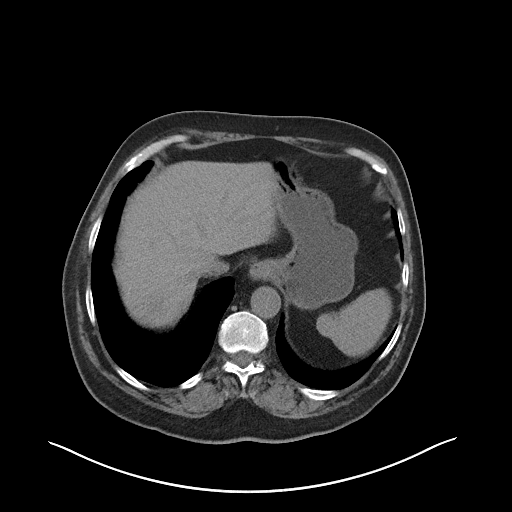
[im 80/90  lung]
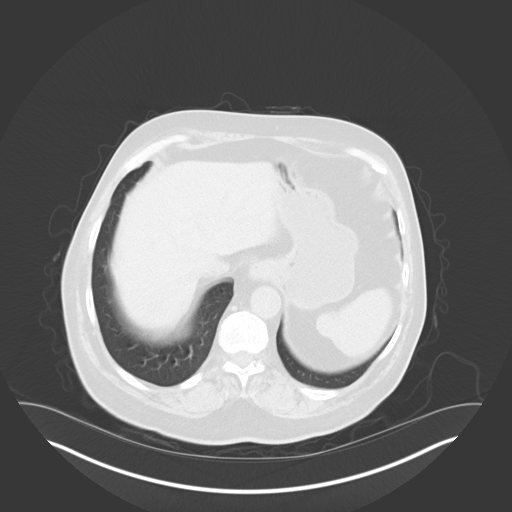

[Series 14: cor delay · coronal · delayed · 0.79mm/px · 2 of 166 slices shown, 3 images]
[im 56/166  soft-tissue]
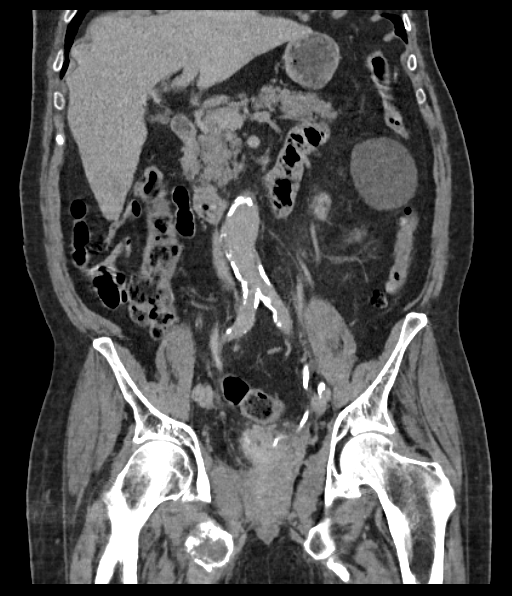
[im 56/166  bone]
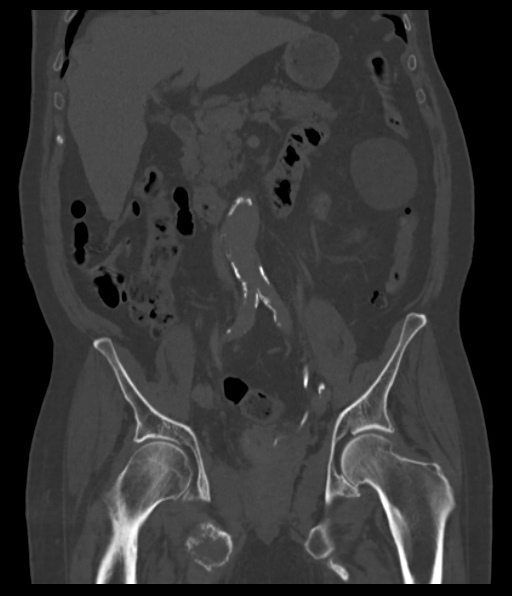
[im 111/166  soft-tissue]
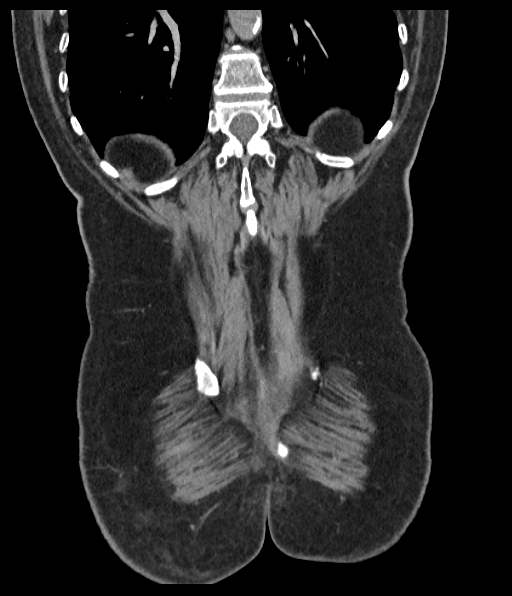

[10 of 46 positions shown; findings below may reference images not displayed]

FINDINGS: Bladder mass is similar to the recent PET-CT. Is heterogeneous
contiguous with the left prostate extending along the posterior left
wall of the bladder. It measures approximately 3.8 cm from anterior
to posterior 2 cm from right to left and estimated at 4.5 cm from
superior to inferior. Hip involves the left aspect the seminal
vesicles and extends beyond the confines of the bladder and to the
left posterior left lateral perivesicular fat.

There is a 13 mm left external iliac chain lymph node, an 8 mm
posterior right external iliac chain lymph node, and several smaller
pelvic lymph nodes. There is a borderline enlarged mesenteric lymph
node at the level of the aortic bifurcation measuring 1 cm in short
axis.

There are low-attenuation liver lesions not evident on the prior
PET-CT There are several of these at the dome of the right and left
lobes of the liver with several along the inferior margin of the
posterior segment of the right lobe. The most inferior lesion
measures 15 mm in size. These have somewhat ill-defined margins. A
single small leads in mildly bulges the anterior contour of the
lateral segment of the left lobe.

The expansile lytic lesion of the inferior ischium and the more
subtle expansile lesion of the ischium along the posterior medial
right acetabulum are stable from the prior PET-CT. No other bone
lesion suspicious for neoplastic disease.

No lung base nodules. Normal spleen, gallbladder and pancreas. No
bile duct dilation. No adrenal masses.

Multiple low-density renal masses consistent with bilateral cysts.
No evidence of an enhancing renal mass. Small nonobstructing stone
in the lower pole of the right kidney. No hydronephrosis. Normal
ureters.

Atherosclerosis is noted throughout the abdominal aorta and its
branch vessels. Diffuse abdominal aortic ectasia. Aorta measures
cm x 2.8 cm in its infrarenal portion.

Scattered colonic diverticula. Bowel is otherwise unremarkable.
Normal appendix is visualized.
IMPRESSION: 1. Progression of metastatic disease from presumed bladder
carcinoma.
2. Primary neoplastic mass involves the left lateral and left
posterior bladder and is contiguous with the left prostate and left
seminal vesicle. It extends beyond the confines of the bladder
towards the left pelvic side wall.
3. There is metastatic adenopathy, the largest node a left external
iliac chain node measuring 13 mm in short axis.
4. There are low-density liver lesions that were not evident on the
recent prior PET-CT. These are presumed to be hepatic metastatic
disease.
5. Right ischial bone lesions are stable from the prior PET-CT
consistent with osseous metastatic disease. No new bone lesions.

## 2015-08-07 ENCOUNTER — Ambulatory Visit: Admit: 2015-08-07 | Discharge: 2015-08-07 | Payer: MEDICARE | Attending: Urology | Primary: Family

## 2015-08-07 DIAGNOSIS — R351 Nocturia: Secondary | ICD-10-CM

## 2015-08-07 LAB — AMB POC URINALYSIS DIP STICK AUTO W/O MICRO
Bilirubin (UA POC): NEGATIVE
Glucose (UA POC): NEGATIVE
Ketones (UA POC): NEGATIVE
Leukocyte esterase (UA POC): NEGATIVE
Nitrites (UA POC): NEGATIVE
Protein (UA POC): NEGATIVE mg/dL
Specific gravity (UA POC): 1.025 (ref 1.001–1.035)
Urobilinogen (UA POC): 0.2 (ref 0.2–1)
pH (UA POC): 5.5 (ref 4.6–8.0)

## 2015-08-07 LAB — AMB POC PVR, MEAS,POST-VOID RES,US,NON-IMAGING: PVR: 96 cc

## 2015-08-07 MED ORDER — MIRABEGRON ER 25 MG TABLET,EXTENDED RELEASE 24 HR
25 mg | ORAL_TABLET | Freq: Every day | ORAL | 3 refills | Status: DC
Start: 2015-08-07 — End: 2016-10-26

## 2015-08-07 NOTE — Progress Notes (Signed)
Colton Cox presents today for lab draw per Dr. Claiborne Billings order.    Patient will be notified by the physician with lab results.    PSA obtained via venipuncture without any difficulty.      Orders Placed This Encounter   ??? PROSTATE SPECIFIC ANTIGEN, TOTAL (PSA)   ??? AMB POC PVR, MEAS,POST-VOID RES,US,NON-IMAGING   ??? AMB POC URINALYSIS DIP STICK AUTO W/O MICRO   ??? FLOVENT HFA 220 mcg/actuation inhaler   ??? aspirin delayed-release 81 mg tablet     Sig: 81 mg.   ??? clopidogrel (PLAVIX) 75 mg tablet     Sig: 75 mg.   ??? DETROL LA 4 mg ER capsule   ??? simvastatin (ZOCOR) 20 mg tablet   ??? finasteride (PROSCAR) 5 mg tablet   ??? tamsulosin (FLOMAX) 0.4 mg capsule   ??? losartan-hydroCHLOROthiazide (HYZAAR) 50-12.5 mg per tablet     Sig: Take 1 Tab by Mouth Once a Day.   ??? mirabegron ER (MYRBETRIQ) 25 mg ER tablet     Sig: Take 1 Tab by mouth daily.     Dispense:  90 Tab     Refill:  [place]

## 2015-08-07 NOTE — Progress Notes (Signed)
ASSESSMENT:   1. Nocturia    2. Microscopic hematuria    3. Lower urinary tract symptoms (LUTS)       69yoM with worsening LUTs/BPE and hx of AMH     PLAN:      Stop [employer] [place] dicussed  Recent cysto 2 years ago.  Patient with +1 blood, 0-3 rbc on mirco, will monitor for now, if persists will repeat hematuria work up  History of elevated PSA - will check today  Obtain [facility]  Discussed outlet procedure  Voiding diary given  Follow up 6 months    We discussed the options for management of LUTS/Benign Prostatic Hyperplasia;  including watchful waiting, over the counter supplements, medical therapy, minimally invasive therapies, laser therapy and transurethral resection/coagulative therapies.      Watchful Waiting: risks and benefits of this approach were discussed including the fact that BPH is usually a progressive disease and can lead to infection, bladder stones, hematuria, incontinence, bladder damage and kidney damage but not limited to these conditions. The need for on-going monitoring, including PSA, DRE, Flow, Bladder scan, and prostate ultrasound.     Alternative therapies: risks and benefits of medical therapy using saw palmetto, prostate formulas and various over the counter therapies.    Minimally Invasive Therapies: risks and benefits of various therapies including but not limited to TransUrethral Microwave Therapy (TUMT), Injection therapies and others.    Laser Therapies: risks and benefits of GreenLight Laser Photoselective Vaporization of the Prostate (PVP), Holmium Laser Ablation of the Prostate (HoLAP), and Holmium Laser Enucleation of the Prostate (HoLEP).     Transurethral resection/Coagulative Therapies and PlasmaButton procedure: risks and benefits of these therapies reviewed as well.     The risks and benefits of each option were reviewed in great detail. All questions answered and the patent desires medical management.                Chief Complaint   Patient presents with   ??? Advice Only       HISTORY OF PRESENT ILLNESS:  Colton Cox is a 69 y.o. male who is seen in consultation as referred by Dr. No primary care provider on file. for BPH/LUTs.  Patient currently on Finasteride/Flomax/Detrol.  Was going to [place] frequency every two hours.  Was better with medication but worse again.  Also c/o nocturia.     States PSA mildly elevated per report.    Denies prostate biopsy  States 2 previous cystoscopies - negative per patient other than enlarged prostate  Main symptom is urgency  C/o dry ejaculations  Some ED - tried PDE5is but side effects   Drink 3-4 cups caffeine daily  History of tobacco use in the past  Patient currently denies and fever, chills, sweats, nausea, vomiting, gross hematuria, dysuria or flank pain      AUA Symptom Score 08/07/2015   Over the past month how often have you had the sensation that your bladder was not completely empty after you finished urinating? 3   Over the past month, how often have had to urinate again less than 2 hours after you last finished urinating? 5   Over the past month, how often have you found you stopped and started again several times when you urinated? 3   Over the past month, how often have you found it difficult to postpone urination? 3   Over the past month, how often have  you had a weak urinary stream? 1   Over the past month, how often have you had to push or strain to begin urinating? 3   Over the past month, how many times did you most typically get up to urinate from the time you went to bed at night until the time you got up in the morning? 4   AUA Score 22   If you were to spend the rest of your life with your urinary condition the way it is now, how would you feel about that? Mixed-about equally satisfied         Past Medical History   Diagnosis Date   ??? Cerebral artery occlusion with cerebral infarction [facility])    ??? Colon polyps    ??? Hypertension     TIA on Plavix    Past Surgical History   Procedure Laterality Date   ??? Hx hernia repair     ??? Pr appendectomy         Social History   Substance Use Topics   ??? Smoking status: Former Smoker   ??? Smokeless tobacco: Former Systems developer     Quit date: 09/20/1988   ??? Alcohol use Yes       Allergies   Allergen Reactions   ??? Bee Sting  [Sting, Bee] Shortness of Breath       Family History   Problem Relation Age of Onset   ??? Colon Cancer Mother    ??? Colon Cancer Father        Current Outpatient Prescriptions   Medication Sig Dispense Refill   ??? FLOVENT HFA 220 mcg/actuation inhaler      ??? aspirin delayed-release 81 mg tablet 81 mg.     ??? clopidogrel (PLAVIX) 75 mg tablet 75 mg.     ??? DETROL LA 4 mg ER capsule      ??? simvastatin (ZOCOR) 20 mg tablet      ??? finasteride (PROSCAR) 5 mg tablet      ??? tamsulosin (FLOMAX) 0.4 mg capsule      ??? losartan-hydroCHLOROthiazide (HYZAAR) 50-12.5 mg per tablet Take 1 Tab by Mouth Once a Day.     ??? mirabegron ER (MYRBETRIQ) 25 mg ER tablet Take 1 Tab by mouth daily. 90 Tab 3       Review of Systems  Constitutional: Fever: No  Skin: Rash: No  HEENT: Hearing difficulty: No  Eyes: Blurred vision: No  Cardiovascular: Chest pain: No  Respiratory: Shortness of breath: No  Gastrointestinal: Nausea/vomiting: No  Musculoskeletal: Back pain: Yes  Neurological: Weakness: No  Psychological: Memory loss:   Comments/additional findings:         PHYSICAL EXAMINATION:   Visit Vitals   ??? BP 120/78   ??? Ht 5\' 11"  (1.803 m)   ??? Wt 239 lb (108.4 kg)   ??? BMI 33.33 kg/m2     Constitutional: WDWN, Pleasant and appropriate affect, No acute distress.    CV:  No peripheral swelling noted  Respiratory: No respiratory distress or difficulties  Abdomen:  No abdominal masses or tenderness. No CVA tenderness. No inguinal hernias noted.   GU Male:    [id number] normal to visual inspection, no erythema or irritation, Sphincter with good tone, Rectum with no hemorrhoids, fissures or masses,  Prostate smooth, symmetric and anodular. Prostate is medium.  SCROTUM:  No scrotal rash or lesions noticed.  Normal bilateral testes and epididymis.   PENIS: Urethral meatus normal in location and size. No urethral discharge.  Skin:  No jaundice.    Neuro/Psych:  Alert and oriented x 3, affect appropriate.   Lymphatic:   No enlarged inguinal lymph nodes.        REVIEW OF LABS AND IMAGING:    Results for orders placed or performed in visit on 08/07/15   AMB POC PVR, MEAS,POST-VOID RES,US,NON-IMAGING   Result Value Ref Range    PVR 96 cc   AMB POC URINALYSIS DIP STICK AUTO W/O MICRO   Result Value Ref Range    Color (UA POC) Yellow     Clarity (UA POC) Clear     Glucose (UA POC) Negative Negative    Bilirubin (UA POC) Negative Negative    Ketones (UA POC) Negative Negative    Specific gravity (UA POC) 1.025 1.001 - 1.035    Blood (UA POC) 1+ Negative    pH (UA POC) 5.5 4.6 - 8.0    Protein (UA POC) Negative Negative mg/dL    Urobilinogen (UA POC) 0.2 mg/dL 0.2 - 1    Nitrites (UA POC) Negative Negative    Leukocyte esterase (UA POC) Negative Negative         A copy of today's office visit with all pertinent imaging results and labs were sent to the referring physician.        Kathreen Cornfield, MD     Discussed the patient's BMI with him.  The BMI follow up plan is as follows: BMI is out of normal parameters and plan is as follows: I have counseled this patient on diet and exercise regimens

## 2015-08-08 LAB — PROSTATE SPECIFIC ANTIGEN, TOTAL (PSA): Prostate Specific Ag: 5.4 ng/mL — ABNORMAL HIGH (ref 0.00–4.00)

## 2015-08-11 NOTE — Progress Notes (Signed)
Called patient and gave him PSA result.  5.4 on finasteride.  Please arrange for him to come back in 3 months with PSA.  Will need records from [facility] to review PSA trend

## 2015-08-18 ENCOUNTER — Encounter

## 2015-11-03 ENCOUNTER — Encounter

## 2015-11-03 ENCOUNTER — Institutional Professional Consult (permissible substitution): Admit: 2015-11-03 | Discharge: 2015-11-03 | Payer: MEDICARE | Primary: Family

## 2015-11-03 ENCOUNTER — Encounter: Attending: Urology | Primary: Family

## 2015-11-03 LAB — PROSTATE SPECIFIC ANTIGEN, TOTAL (PSA): Prostate Specific Ag: 3.75 ng/mL (ref 0.00–4.00)

## 2015-11-03 MED ORDER — FINASTERIDE 5 MG TAB
5 mg | ORAL_TABLET | Freq: Every day | ORAL | 3 refills | Status: DC
Start: 2015-11-03 — End: 2015-11-11

## 2015-11-03 NOTE — Telephone Encounter (Signed)
Script for finasteride printed out waiting on [place].  [name]

## 2015-11-03 NOTE — Progress Notes (Signed)
Pt is suppose to be on the same day schedule. He was moved over.

## 2015-11-03 NOTE — Progress Notes (Signed)
A user error has taken place: encounter opened in error, closed for administrative reasons.

## 2015-11-11 ENCOUNTER — Ambulatory Visit: Admit: 2015-11-11 | Discharge: 2015-11-11 | Payer: MEDICARE | Attending: Urology | Primary: Family

## 2015-11-11 DIAGNOSIS — R972 Elevated prostate specific antigen [PSA]: Secondary | ICD-10-CM

## 2015-11-11 LAB — AMB POC URINALYSIS DIP STICK AUTO W/ MICRO (MICRO RESULTS)
Bilirubin (UA POC): NEGATIVE
Glucose (UA POC): NEGATIVE
Ketones (UA POC): NEGATIVE
Leukocyte esterase (UA POC): NEGATIVE
Nitrites (UA POC): NEGATIVE
Protein (UA POC): NEGATIVE mg/dL
Specific gravity (UA POC): 1.02 (ref 1.001–1.035)
Urobilinogen (UA POC): 0.2 (ref 0.2–1)
pH (UA POC): 5 (ref 4.6–8.0)

## 2015-11-11 LAB — AMB POC PVR, MEAS,POST-VOID RES,US,NON-IMAGING: PVR: 100 cc

## 2015-11-11 MED ORDER — TAMSULOSIN SR 0.4 MG 24 HR CAP
0.4 mg | ORAL_CAPSULE | Freq: Every day | ORAL | 3 refills | Status: DC
Start: 2015-11-11 — End: 2016-10-26

## 2015-11-11 MED ORDER — LEVOFLOXACIN 750 MG TAB
750 mg | ORAL_TABLET | Freq: Every day | ORAL | 0 refills | Status: DC
Start: 2015-11-11 — End: 2015-12-26

## 2015-11-11 MED ORDER — SODIUM PHOSPHATES 19 GRAM-7 GRAM/118 ML ENEMA
19-7 gram/118 mL | RECTAL | 0 refills | Status: AC
Start: 2015-11-11 — End: 2015-11-11

## 2015-11-11 MED ORDER — FINASTERIDE 5 MG TAB
5 mg | ORAL_TABLET | Freq: Every day | ORAL | 3 refills | Status: DC
Start: 2015-11-11 — End: 2016-10-26

## 2015-11-11 NOTE — Progress Notes (Signed)
[name]  [birth year]    ASSESSMENT:   1. PSA elevation    2. Nocturia    3. Microscopic hematuria    4. Lower urinary tract symptoms (LUTS)    5. Incomplete bladder emptying      1. PSA elevation, most recently 3.75 x 2 = 7.50 ng/mL on 11/03/15, on Proscar 5 mg daily  2. LUTS on Flomax 0.4 mg, Proscar 5 mg, and Myrbertiq 25 mg daily with improvement of nocturia to x 1-2  3. Incomplete bladder emptying, PVR 100 cc today (11/11/15)  4. History of microscopic hematuria, 1+ blood and 5-10 RBC by UA today (11/11/15)  5. History of TIA on Plavix 75 mg and aspirin 81 mg daily  6. History of tobacco use  7. Continuous exposure to tobacco, wife continues to smoke    PLAN:    1. PVR 100 cc   2. UA with trace 1+ blood and 5-10 RBC today.  3. Reviewed his voiding diary today. It appears that he voids more frequency at night. Explained that alcohol may contribute to his nocturia as it is a diuretic.  4. Obtain a CT urogram to evaluate his microscopic hematuria. Will review at follow up  5. Reviewed his PSA from 11/03/15 - 3.75 x 2 = 7.50 ng/mL.  6. Recommended proceeding with a TRUS biopsy in view of his rising PSA despite being on Proscar 5 mg daily. Reviewed the risks including bleeding in urine, with bowel movements or in semen, infection, possible sepsis, and discomfort. Explained possible need for hospitalization, approx 1%, and need for surgical intervention or IV antibiotics.   7. Explained that there is a 13% probability of him having high grade prostate cancer, 22% probability of having low grade prostate cancer, 35% probability overall of having prostate cancer if we proceed with a prostate biopsy. Patient indicates understanding and agrees to proceed with a TRUS biopsy.  8. Advised him to discontinue any anticoagulating agents 7-10 days prior to the procedure including fish oil, vitamin E, flaxseed oils, NSAIDs, Coumadin, Xarelto, etc. Since patient takes Plavix and aspirin on a  regular basis, will con't ASA 81mg  for the biopsy.  9. Instructed him to perform an enema the morning of the procedure. Provided a prescription today.  10. Take Levaquin 750 mg PO 1 tab 2 hours prior to the procedure. Provided a prescription today.  11. Continue to take Flomax 0.4 mg, Proscar 5 mg, and Myrbertiq 25 mg daily.  12. Patient's BMI is out of the normal parameters. Information about BMI was given to the patient.   13. Answered all his questions.  14. Return in 2-3 weeks for a cystoscopy and TRUS biopsy.  15. Consider if he is eligible for an outlet procedure afterwards.    DISCUSSION:  The natural history of prostate cancer and ongoing controversy regarding screening and potential treatment outcomes of prostate cancer has been discussed with the patient. The meaning of a false positive PSA and a false negative PSA has been discussed. He indicates understanding of the limitations of this screening test.     We reviewed the implications of an elevated PSA and the uncertainty surrounding it. In general, a man's PSA increases with age and is produced by both normal and cancerous prostate tissue. Differential for elevated PSA is BPH, prostate cancer, infection, recent intercourse/ejaculation, prostate infarction, recent urethroscopic manipulation (foley placement/cystoscopy) and prostatitis. Although the PSA test can detect high levels of PSA in the blood, the test doesn't provide precise diagnostic  information about the condition of the prostate. Management of an elevated PSA can include observation or prostate biopsy and we discussed this in detail.     We discussed that indications for prostate biopsy are defined by age and race specific PSA cutoffs as well as a PSA velocity of 0.75/year:    Age 72-73 72-72 60-69 41+    Race   White 2.5 3.5 4.5 6.5   African-American 2 3 4 6     We discussed in detail the risks of prostate biopsy which include  bleeding, which is somewhat expected, although arterial bleeding is rarely encountered and may require endoscopic clipping. Blood in the urine and stool is normal for several days and blood in the semen can be expected up to 4-6 weeks. Infection is also another which we attempt to minimize with double antibiotic coverage, even with this the risk sepsis requiring hospitalization and IV antibiotics is between 0.5-1%. After repeated prostate biopsies(3 or more), there may also be an association with erectile dysfunction.      No chief complaint on file.    HISTORY OF PRESENT ILLNESS:  Colton Cox is a 70 y.o. male who presents in follow up for his PSA elevation and LUTS. He has been doing well since his last visit. He denies any dysuria, gross hematuria, incontinence, abdominal pain, flank pain, or new voiding symptoms that affect his quality of life, daily activities, responsibilities, or sleep pattern. He continues to take Flomax 0.4 mg, Proscar 5 mg, and Myrbertiq 25 mg daily with benefit. He took Detrol previously with no improvement. He drinks 3-4 cups caffeine and alcohol regularly. The patient has not had any previous prostate biopsies, but he states that he had two negative cystoscopies in the past. He was reportedly only noted to have prostate hypertrophy. He takes Plavix 75 mg and aspirin 81 mg on a regular basis. The patient has a history of tobacco use.    Patient currently denies and fever, chills, sweats, nausea, vomiting, gross hematuria, dysuria or flank pain  No bone or back pain    Erectile Status: He has problems with ED. He has tried PDE5i's in the past, but he could not tolerate the side effects.     PSA   Latest Ref Rng & Units 0.00 - 4.00 ng/mL   11/03/2015 3.75   08/07/2015 5.40 (H)     Review of Systems  Constitutional: Fever: No  Skin: Rash: No  HEENT: Hearing difficulty: No  Eyes: Blurred vision: No  Cardiovascular: Chest pain: No  Respiratory: Shortness of breath: No   Gastrointestinal: Nausea/vomiting: No  Musculoskeletal: Back pain: No  Neurological: Weakness: No  Psychological: Memory loss: No  Comments/additional findings:     AUA Symptom Score 11/11/2015   Over the past month how often have you had the sensation that your bladder was not completely empty after you finished urinating? 1   Over the past month, how often have had to urinate again less than 2 hours after you last finished urinating? 2   Over the past month, how often have you found you stopped and started again several times when you urinated? 1   Over the past month, how often have you found it difficult to postpone urination? 1   Over the past month, how often have you had a weak urinary stream? 1   Over the past month, how often have you had to push or strain to begin urinating? 0   Over the past month, how many  times did you most typically get up to urinate from the time you went to bed at night until the time you got up in the morning? 3   AUA Score 9   If you were to spend the rest of your life with your urinary condition the way it is now, how would you feel about that? Mixed-about equally satisfied     Past Medical History:   Diagnosis Date   ??? Cerebral artery occlusion with cerebral infarction [facility])    ??? Colon polyps    ??? Hypertension    TIA on Plavix    Past Surgical History:   Procedure Laterality Date   ??? APPENDECTOMY     ??? HX HERNIA REPAIR       Social History   Substance Use Topics   ??? Smoking status: Former Smoker   ??? Smokeless tobacco: Former Systems developer     Quit date: 09/20/1988   ??? Alcohol use Yes     Allergies   Allergen Reactions   ??? Bee Sting  [Sting, Bee] Shortness of Breath     Family History   Problem Relation Age of Onset   ??? Colon Cancer Mother    ??? Colon Cancer Father        Current Outpatient Prescriptions   Medication Sig Dispense Refill   ??? multivitamin (ONE A DAY) tablet Take 1 Tab by mouth daily.     ??? finasteride (PROSCAR) 5 mg tablet Take 1 Tab by mouth daily. 90 Tab 3    ??? tamsulosin (FLOMAX) 0.4 mg capsule Take 1 Cap by mouth daily. 90 Cap 3   ??? levoFLOXacin (LEVAQUIN) 750 mg tablet Take 1 Tab by mouth daily. 1 Tab 0   ??? FLOVENT HFA 220 mcg/actuation inhaler      ??? aspirin delayed-release 81 mg tablet 81 mg.     ??? clopidogrel (PLAVIX) 75 mg tablet 75 mg.     ??? simvastatin (ZOCOR) 20 mg tablet      ??? losartan-hydroCHLOROthiazide (HYZAAR) 50-12.5 mg per tablet Take 1 Tab by Mouth Once a Day.     ??? mirabegron ER (MYRBETRIQ) 25 mg ER tablet Take 1 Tab by mouth daily. 90 Tab 3     PHYSICAL EXAMINATION:   Visit Vitals   ??? BP 122/68   ??? Ht 5\' 11"  (1.803 m)   ??? Wt 239 lb (108.4 kg)   ??? BMI 33.33 kg/m2   Constitutional: WDWN, Pleasant and appropriate affect, No acute distress.    CV:  No peripheral swelling noted  Respiratory: No respiratory distress or difficulties  Abdomen:  No abdominal masses or tenderness. No CVA tenderness. No inguinal hernias noted.   GU Male:  Deferred.  Skin: No jaundice.    Neuro/Psych:  Alert and oriented x 3, affect appropriate.   Lymphatic:   No enlarged inguinal lymph nodes.      REVIEW OF LABS AND IMAGING:    Results for orders placed or performed in visit on 11/11/15   URINE C&S   Result Value Ref Range    FINAL REPORT Microbiology results    PROSTATE SPECIFIC ANTIGEN, TOTAL (PSA)   Result Value Ref Range    Prostate Specific Ag 3.75 0.00 - 4.00 ng/mL   AMB POC URINALYSIS DIP STICK AUTO W/ MICRO (MICRO RESULTS)   Result Value Ref Range    Color (UA POC) Yellow     Clarity (UA POC) Clear     Glucose (UA POC) Negative Negative    Bilirubin (  UA POC) Negative Negative    Ketones (UA POC) Negative Negative    Specific gravity (UA POC) 1.020 1.001 - 1.035    Blood (UA POC) 1+ Negative    pH (UA POC) 5.0 4.6 - 8.0    Protein (UA POC) Negative Negative mg/dL    Urobilinogen (UA POC) 0.2 mg/dL 0.2 - 1    Nitrites (UA POC) Negative Negative    Leukocyte esterase (UA POC) Negative Negative    Epithelial cells (UA POC)      WBCs (UA POC)      RBCs (UA POC) 5-10      Bacteria (UA POC)  Negative    Crystals (UA POC)  Negative    Other (UA POC)     AMB POC PVR, MEAS,POST-VOID RES,US,NON-IMAGING   Result Value Ref Range    PVR 100 cc   CYTOLOGY NON GYN, [facility] [place] [facility]   Result Value Ref Range    APRESULT AP results      Kathreen Cornfield, MD     Documentation was provided with the assistance of Trinna Post, medical scribe for Kathreen Cornfield, MD on 11/16/2015.

## 2015-11-12 NOTE — Progress Notes (Signed)
Faxed imaging to [facility]

## 2015-11-13 LAB — URINE C&S

## 2015-11-13 LAB — CYTOLOGY NON GYN, UROLOGY OF ~~LOC~~ LAB

## 2015-12-01 NOTE — Telephone Encounter (Signed)
Called patient to confirm his appointment for a prostate biopsy to be done on  December 12, 2015     Medications have been reviewed:  yes  Patient is currently taking blood thinners:  yes  81 mg ASA is not necessary to stop     Must stop 14 days prior  Ticlid:  no     Must stop 7 days prior  Fish Oil:  no  Vitamin E:   no  Other Herbal Meds:  no     Must stop 5 days prior  Brillinta:  no  Coumadin:  no  Plavix:  yes   Pradaxa:  no     Must stop 48 hours prior  Eliquis:  no     Must stop 24 hours prior  Lovenox:  no  Xarelto:  no    Patient understands the date (date noted above) are to stop taking any of the above medications.    Patient has the prescribed oral abx and knows to take it two hours before the biopsy:  yes  Patient understands to eat a normal meal before their biopsy and to push fluids before and after their biopsy:  yes  Patient understands they need to arrive 30 Minutes early:  yes     All other questions have been answered:  yes    Colton Cox

## 2015-12-02 ENCOUNTER — Encounter

## 2015-12-12 ENCOUNTER — Ambulatory Visit: Admit: 2015-12-12 | Discharge: 2015-12-12 | Payer: MEDICARE | Attending: Urology | Primary: Family

## 2015-12-12 DIAGNOSIS — R972 Elevated prostate specific antigen [PSA]: Secondary | ICD-10-CM

## 2015-12-12 LAB — AMB POC URINALYSIS DIP STICK AUTO W/O MICRO
Bilirubin (UA POC): NEGATIVE
Glucose (UA POC): NEGATIVE
Ketones (UA POC): NEGATIVE
Leukocyte esterase (UA POC): NEGATIVE
Nitrites (UA POC): NEGATIVE
Protein (UA POC): NEGATIVE mg/dL
Specific gravity (UA POC): 1.01 (ref 1.001–1.035)
Urobilinogen (UA POC): 0.2 (ref 0.2–1)
pH (UA POC): 5.5 (ref 4.6–8.0)

## 2015-12-12 MED ORDER — CEFTRIAXONE 250 MG SOLUTION FOR INJECTION
250 mg | Freq: Once | INTRAMUSCULAR | 0 refills | Status: AC
Start: 2015-12-12 — End: 2015-12-12

## 2015-12-12 NOTE — Progress Notes (Signed)
Orders Placed This Encounter   ??? CEFTRIAXONE SODIUM INJECTION PER 250 MG     Mix per protocol     Order Specific Question:   Dose     Answer:   250mg x 4 = 1 gram     Order Specific Question:   Site     Answer:   RIGHT GLUTEUS     Order Specific Question:   Expiration Date     Answer:   09/12/2017     Order Specific Question:   Lot#     Answer:   [id number]     Order Specific Question:   Manufacturer     Answer:   lupin     Order Specific Question:   Charge Quantity?     Answer:   4     Order Specific Question:   Perfomed by/Witnessed by:     Answer:   r Jessy Cybulski     Order Specific Question:   NDC#     Answer:   68180-633-01   ??? PR THER/PROPH/DIAG INJECTION, SUBCUT/IM   ??? cefTRIAXone (ROCEPHIN) 250 mg injection     Sig: 250 mg by IntraMUSCular route once for 1 dose.     Dispense:  1 Vial     Refill:  0

## 2015-12-12 NOTE — Progress Notes (Signed)
CYSTOSCOPY PROCEDURE        Patient Name: Colton Cox            Date of Procedure: 12/15/2015     Pre-procedure Diagnosis:     ICD-10-CM ICD-9-CM    1. Elevated PSA R97.20 790.93 CEFTRIAXONE SODIUM INJECTION PER 250 MG      PR THER/PROPH/DIAG INJECTION, SUBCUT/IM      cefTRIAXone (ROCEPHIN) 250 mg injection      AMB POC URINALYSIS DIP STICK AUTO W/O MICRO   2. Microscopic hematuria R31.29 599.72 CEFTRIAXONE SODIUM INJECTION PER 250 MG      PR THER/PROPH/DIAG INJECTION, SUBCUT/IM      cefTRIAXone (ROCEPHIN) 250 mg injection      AMB POC URINALYSIS DIP STICK AUTO W/O MICRO      CYSTOURETHROSCOPY   3. Benign non-nodular prostatic hyperplasia with lower urinary tract symptoms N40.1 600.91       Microscopic Hematuria    Post-procedure Diagnosis: Microscopic hematuria    Consent:  All risks, benefits and options were reviewed in detail and the patient agrees to procedure. Risks include but are not limited to bleeding, infection, sepsis, death, dysuria and others.     Procedure:  The patient was placed in the supine position, and prepped and draped in the normal fashion. 5 ml of 4% Lidocaine gel was placed in the urethra. Once adequate anesthesia was achieved; the flexible cystoscope was placed into the bladder.     Findings as follows:      Meatus: normal  Urethra: normal  Prostate: moderate trilobar hypertophy with moderate median lobe. Appears obstructing  Bladder neck: normal  Trigone:  normal  Trabeculation:2+  Diverticuli:  none  Lesion:  none    Antibiotic provided:  Yes     Lab / Imaging:   Results for orders placed or performed in visit on 12/12/15   AMB POC URINALYSIS DIP STICK AUTO W/O MICRO   Result Value Ref Range    Color (UA POC) Yellow     Clarity (UA POC) Clear     Glucose (UA POC) Negative Negative    Bilirubin (UA POC) Negative Negative    Ketones (UA POC) Negative Negative    Specific gravity (UA POC) 1.010 1.001 - 1.035    Blood (UA POC) 1+ Negative    pH (UA POC) 5.5 4.6 - 8.0     Protein (UA POC) Negative Negative mg/dL    Urobilinogen (UA POC) 0.2 mg/dL 0.2 - 1    Nitrites (UA POC) Negative Negative    Leukocyte esterase (UA POC) Negative Negative        Diagnoses:       ICD-10-CM ICD-9-CM    1. Elevated PSA R97.20 790.93 CEFTRIAXONE SODIUM INJECTION PER 250 MG      PR THER/PROPH/DIAG INJECTION, SUBCUT/IM      cefTRIAXone (ROCEPHIN) 250 mg injection      AMB POC URINALYSIS DIP STICK AUTO W/O MICRO   2. Microscopic hematuria R31.29 599.72 CEFTRIAXONE SODIUM INJECTION PER 250 MG      PR THER/PROPH/DIAG INJECTION, SUBCUT/IM      cefTRIAXone (ROCEPHIN) 250 mg injection      AMB POC URINALYSIS DIP STICK AUTO W/O MICRO      CYSTOURETHROSCOPY   3. Benign non-nodular prostatic hyperplasia with lower urinary tract symptoms N40.1 600.91           SUMMARY OF VISIT AND PLAN:  1. See separate office note.       Kathreen Cornfield, MD  Medical documentation provided with the assistance of Christin Lucia Gaskins, medical scribe for Kathreen Cornfield, MD.

## 2015-12-12 NOTE — Progress Notes (Signed)
TRANSRECTAL ULTRASOUND AND PROSTATE NEEDLE BIOPSY NOTE    Current Outpatient Prescriptions on File Prior to Visit   Medication Sig Dispense Refill   ??? multivitamin (ONE A DAY) tablet Take 1 Tab by mouth daily.     ??? finasteride (PROSCAR) 5 mg tablet Take 1 Tab by mouth daily. 90 Tab 3   ??? tamsulosin (FLOMAX) 0.4 mg capsule Take 1 Cap by mouth daily. 90 Cap 3   ??? levoFLOXacin (LEVAQUIN) 750 mg tablet Take 1 Tab by mouth daily. 1 Tab 0   ??? FLOVENT HFA 220 mcg/actuation inhaler      ??? aspirin delayed-release 81 mg tablet 81 mg.     ??? simvastatin (ZOCOR) 20 mg tablet      ??? losartan-hydroCHLOROthiazide (HYZAAR) 50-12.5 mg per tablet Take 1 Tab by Mouth Once a Day.     ??? mirabegron ER (MYRBETRIQ) 25 mg ER tablet Take 1 Tab by mouth daily. 90 Tab 3   ??? clopidogrel (PLAVIX) 75 mg tablet 75 mg.       No current facility-administered medications on file prior to visit.        Allergies   Allergen Reactions   ??? Bee Sting  [Sting, Bee] Shortness of Breath       PSA:    3.75 (~7.5 on Proscar 5mg )    Prebiopsy diagnosis:  Elevated PSA    Postbiopsy diagnosis:  Same    Procedure performed:  Transrectal [place] and Echo guided Prostate Needle Biopsy    Surgeon: Kathreen Cornfield, MD    Palpable Nodule: No    Consent:  All risks, benefits and options were reviewed in detail and the patient agrees to procedure. Risks include but are not limited to bleeding, infection, sepsis, death, dysuria and others.      Procedure:  Patient has confirmed that the oral antibiotic Levaquin was taken prior to the procedure and had a limited bowel prep. IM antibiotic Rocephin was given by staff.  The patient verifies that he is not taking blood thinners or aspirin.  Urinalysis noted to be negative for infection.      He was placed in the left lateral decubitus position with knees drawn up.  An endorectal probe was placed in the rectum. Anesthesia: Prostate Block - 2% Xylocaine was injected in an appropriate fashion.    Transverse and  sagittal images of the prostate and seminal vesicles were obtained and measurements were recorded.  Using the needle guide on the probe, a biopsy needle was used to obtain biopsies of the prostate in a standard fashion.  If seen, any abnormal lesions were targeted along with systematic biopsies from each side.  Tissue cores were placed in formalin jars and sent for pathologic review.    The probe was removed and the patient was observed.  Appropriate activity levels and timing of resuming sexual activity was reviewed.  Patient was warned of potential complications and with instructions on how to contact our office. This includes but not limited to elevated temperature, excessive bleeding per urethra or rectum as well as others.  All questions were answered to his satisfaction.       Findings:    DRE-  Approximately 50 gm size gland.  without prostate nodule  TRUS Volume:  49.57 cm3   PSA-Density: 0.08  Prostate capsule: intact  Hypoechoic Mass:  YES Location:  Left base  Seminal vesicles:  Normal and symmetrical      Biopsy Pattern:  Base  Mid   Apex  TZ  Left  1  1  1   0     Right    1  1  1   0  Lateral left 1  1  1   0  Lateral right 1  1  1   0         Plan:   See separate office note.         Kathreen Cornfield, MD     Medical documentation provided with the assistance of Christin [facility], medical scribe for Kathreen Cornfield, MD.

## 2015-12-12 NOTE — Progress Notes (Signed)
Colton Cox  1946/06/05    ASSESSMENT:   1. Elevated PSA    2. Microscopic hematuria    3. Benign non-nodular prostatic hyperplasia with lower urinary tract symptoms      1. PSA elevation, most recently 3.75 x 2 = 7.50 ng/mL on 11/03/15, on Proscar 5 mg daily. TRUS Biopsy today to further evaluate.   2. BPH w/ LUTS on Flomax 0.4 mg, Proscar 5 mg, and Myrbertiq 25 mg daily with improvement of nocturia to x 1-2. Pt continues to note incomplete bladder emptying. Cysto today shows obstructing prostate; trilobar with moderate median lobe.    3. History of microscopic hematuria, 1+ blood and 5-10 RBC by UA on 11/11/15. Urine cytology 10/2015 showed rare atypical urothelial cells. CT Urogram 11/21/15 showed slightly attenuated and perhaps slightly thickened mid-distal right ureter/equivocally strictured, but without proximal dilatation. Might need retrograde ureterography in future. Cysto today WNL.    4. History of TIA on Plavix 75 mg and aspirin 81 mg daily  5. History of tobacco use  6. Continuous exposure to tobacco, wife continues to smoke    PLAN:    1. TRUS Biopsy today. See separate office note. Will review pathology results in 2 weeks.   2. CT Urogram reviewed with pt. Shows slightly attenuated and perhaps slightly thickened mid-distal right ureter/equivocally strictured, but without proximal dilatation. Suggest retrograde ureterography. Report provided to pt. Outlined below.   3. Urine cytology reviewed with pt. Shows rare atypical urothelial cells.   4. Continue Flomax 0.4 mg, Proscar 5 mg, and Myrbertiq 25 mg daily.  5. Discussed outlet procedure for BPH with possible retrograde ureterography to check on CTU findings. Cysto today with obstructing median lobe. Discussed enucleation of prostate to help with LUTS. Discussed indications and r/b.    6. RTO in 2 weeks to discuss pathology results and possible H/W if pathology negative.     Send for enucleation of prostate with retrograde ureterography. Letter  sent. The procedure, risks and complications were reviewed at length. All questions were answered and the pt desires to proceed.     13% probability of him having high grade prostate cancer, 22% probability of having low grade prostate cancer    Chief Complaint   Patient presents with   ??? Hematuria   ??? Elevated PSA       HISTORY OF PRESENT ILLNESS:  Colton Cox is a 70 y.o. male who presents in follow up for elevated PSA, LUTS and microscopic hematuria for both a cysto and biopsy. He has been doing well since his last visit. He denies any change in urinary symptoms. He continues to take Flomax 0.4 mg, Proscar 5 mg, and Myrbertiq 25 mg daily with benefit. His nocturia has improved to x1-2. He took Detrol previously with no improvement. He drinks 3-4 cups caffeine and alcohol regularly.     Pt most recent PSA 3.75 x 2 = 7.50 ng/mL on 11/03/15, on Proscar 5 mg daily. The patient has not had any previous prostate biopsies.    Microhematuria: 1+ blood and 5-10 RBC by UA on 11/11/15. Urine cytology 10/2015 showed rare atypical urothelial cells. CT Urogram 11/21/15 showed slightly attenuated and perhaps slightly thickened mid-distal right ureter/equivocally strictured, but without proximal dilatation. Cysto today WNL. CTU suggested retrograde ureterography in the future.      He takes Plavix 75 mg and aspirin 81 mg on a regular basis. The patient has a history of tobacco use.    Patient currently denies and fever,  chills, sweats, nausea, vomiting, gross hematuria, dysuria or flank pain  No bone or back pain    Erectile Status: He has problems with ED. He has tried PDE5i's in the past, but he could not tolerate the side effects.     PSA   Latest Ref Rng & Units 0.00 - 4.00 ng/mL   11/03/2015 3.75   08/07/2015 5.40 (H)     Review of Systems  Constitutional: Fever: No  Skin: Rash: No  HEENT: Hearing difficulty: No  Eyes: Blurred vision: No  Cardiovascular: Chest pain: No  Respiratory: Shortness of breath: No   Gastrointestinal: Nausea/vomiting: No  Musculoskeletal: Back pain: No  Neurological: Weakness: No  Psychological: Memory loss: No  Comments/additional findings:     AUA Symptom Score 11/11/2015   Over the past month how often have you had the sensation that your bladder was not completely empty after you finished urinating? 1   Over the past month, how often have had to urinate again less than 2 hours after you last finished urinating? 2   Over the past month, how often have you found you stopped and started again several times when you urinated? 1   Over the past month, how often have you found it difficult to postpone urination? 1   Over the past month, how often have you had a weak urinary stream? 1   Over the past month, how often have you had to push or strain to begin urinating? 0   Over the past month, how many times did you most typically get up to urinate from the time you went to bed at night until the time you got up in the morning? 3   AUA Score 9   If you were to spend the rest of your life with your urinary condition the way it is now, how would you feel about that? Mixed-about equally satisfied     Past Medical History:   Diagnosis Date   ??? Cerebral artery occlusion with cerebral infarction [facility])    ??? Colon polyps    ??? Hypertension    TIA on Plavix    Past Surgical History:   Procedure Laterality Date   ??? APPENDECTOMY     ??? HX HERNIA REPAIR       Social History   Substance Use Topics   ??? Smoking status: Former Smoker   ??? Smokeless tobacco: Former Systems developer     Quit date: 09/20/1988   ??? Alcohol use Yes     Allergies   Allergen Reactions   ??? Bee Sting  [Sting, Bee] Shortness of Breath     Family History   Problem Relation Age of Onset   ??? Colon Cancer Mother    ??? Colon Cancer Father        Current Outpatient Prescriptions   Medication Sig Dispense Refill   ??? multivitamin (ONE A DAY) tablet Take 1 Tab by mouth daily.     ??? finasteride (PROSCAR) 5 mg tablet Take 1 Tab by mouth daily. 90 Tab 3    ??? tamsulosin (FLOMAX) 0.4 mg capsule Take 1 Cap by mouth daily. 90 Cap 3   ??? levoFLOXacin (LEVAQUIN) 750 mg tablet Take 1 Tab by mouth daily. 1 Tab 0   ??? FLOVENT HFA 220 mcg/actuation inhaler      ??? aspirin delayed-release 81 mg tablet 81 mg.     ??? simvastatin (ZOCOR) 20 mg tablet      ??? losartan-hydroCHLOROthiazide (HYZAAR) 50-12.5 mg per tablet Take  1 Tab by Mouth Once a Day.     ??? mirabegron ER (MYRBETRIQ) 25 mg ER tablet Take 1 Tab by mouth daily. 90 Tab 3   ??? clopidogrel (PLAVIX) 75 mg tablet 75 mg.       PHYSICAL EXAMINATION:   Visit Vitals   ??? BP 112/62   ??? Ht 5\' 11"  (1.803 m)   ??? Wt 239 lb (108.4 kg)   ??? BMI 33.33 kg/m2   Constitutional: WDWN, Pleasant and appropriate affect, No acute distress.    CV:  No peripheral swelling noted  Respiratory: No respiratory distress or difficulties  Abdomen:  No abdominal masses or tenderness. No CVA tenderness. No inguinal hernias noted.   GU Male:  Deferred.  Skin: No jaundice.    Neuro/Psych:  Alert and oriented x 3, affect appropriate.   Lymphatic:   No enlarged inguinal lymph nodes.      REVIEW OF LABS AND IMAGING:    Results for orders placed or performed in visit on 12/12/15   AMB POC URINALYSIS DIP STICK AUTO W/O MICRO   Result Value Ref Range    Color (UA POC) Yellow     Clarity (UA POC) Clear     Glucose (UA POC) Negative Negative    Bilirubin (UA POC) Negative Negative    Ketones (UA POC) Negative Negative    Specific gravity (UA POC) 1.010 1.001 - 1.035    Blood (UA POC) 1+ Negative    pH (UA POC) 5.5 4.6 - 8.0    Protein (UA POC) Negative Negative mg/dL    Urobilinogen (UA POC) 0.2 mg/dL 0.2 - 1    Nitrites (UA POC) Negative Negative    Leukocyte esterase (UA POC) Negative Negative     CT Urogram 11/21/2015:  Impression:  1. Bladder wall thickening is seen setting this should be correlated with clinical and laboratory/urinalysis findings.?? Nodular prostate, indenting the bladder base, nonspecific and should be correlated again with clinical findings.   2.?? Slightly attenuated and perhaps slightly thickened mid-distal right ureter/equivocally strictured, but without proximal dilatation.?? If urinalysis findings are suspicious, suggest retrograde ureterography.  3.?? Diverticulosis without CT evidence diverticulitis.?? Renal and parapelvic cysts, but subcentimeter renal hypodensities too small to characterize.  4.?? Degenerative spondylosis.?? In the setting of radiculopathy, routine lumbar spine MRI could best further assess.?? ??    Cytology 11/11/2015:  CYTOLOGIC DIAGNOSIS:   Rare atypical urothelial cells.     Kathreen Cornfield, MD     Medical documentation provided with the assistance of Christin [facility], medical scribe for Kathreen Cornfield, MD.

## 2015-12-16 ENCOUNTER — Telehealth

## 2015-12-16 LAB — PROSTATE BIOPSY

## 2015-12-16 NOTE — Progress Notes (Signed)
Called patient with results.  Please schedule bone scan prior to follow up

## 2015-12-16 NOTE — Telephone Encounter (Signed)
Called patient with biopsy results.  Will order bone scan.  Will need URS and possible outlet procedure followed by XRT vs DVP

## 2015-12-17 NOTE — Progress Notes (Signed)
Schedule NM bone scan with Roderic Palau @ [facility] on 12/22/15 @ 7:30 AM

## 2015-12-17 NOTE — Telephone Encounter (Signed)
Called patient to inform him that I have scheduled the NM bone scan Dr. Claiborne Billings wanted him to have done before his f/u appt at [facility] on 12/22/15 @ 7:30 AM.  I told him that date and time did not work for him, we could always reschedule. He stated that appt. Was fine with him.  I informed him there was no prep.  He understood and thanked me.

## 2015-12-22 NOTE — Addendum Note (Signed)
Addended by: [name] on: 12/22/2015 11:50 AM      Modules accepted: Orders, SmartSet

## 2015-12-23 ENCOUNTER — Encounter

## 2015-12-26 ENCOUNTER — Ambulatory Visit: Admit: 2015-12-26 | Discharge: 2015-12-26 | Payer: MEDICARE | Attending: Urology | Primary: Family

## 2015-12-26 DIAGNOSIS — R3129 Other microscopic hematuria: Secondary | ICD-10-CM

## 2015-12-26 LAB — AMB POC URINALYSIS DIP STICK AUTO W/O MICRO
Bilirubin (UA POC): NEGATIVE
Glucose (UA POC): NEGATIVE
Ketones (UA POC): NEGATIVE
Leukocyte esterase (UA POC): NEGATIVE
Nitrites (UA POC): NEGATIVE
Protein (UA POC): NEGATIVE mg/dL
Specific gravity (UA POC): 1.02 (ref 1.001–1.035)
Urobilinogen (UA POC): 0.2 (ref 0.2–1)
pH (UA POC): 5.5 (ref 4.6–8.0)

## 2015-12-26 NOTE — Progress Notes (Signed)
Colton Cox  DOB 01-28-1946  70 y.o.  12/26/2015     PROSTATE CANCER NEW DIAGNOSIS    Encounter Diagnoses     ICD-10-CM ICD-9-CM   1. Microscopic hematuria R31.29 599.72   2. Prostate cancer ([place]) C61 185       Assessment:  3. 70 y.o. male with newly diagnosed cT1cNxM0 GS 4+3, Prostate cancer in 9/12 cores, prostate volume of  49.57 cm3, Pre-biopsy PSA= 3.75ng/mL.    2. BPH w/ LUTS on Flomax 0.4 mg, Proscar 5 mg, and Myrbertiq 25 mg daily with improvement of nocturia to x 1-2. Pt continues to note incomplete bladder emptying. Cysto 12/12/2015 shows obstructing prostate; trilobar with moderate median lobe.    3. History of microscopic hematuria, 1+ blood and 5-10 RBC by UA on 11/11/15. Urine cytology 10/2015 showed rare atypical urothelial cells. CT Urogram 11/21/15 showed slightly attenuated and perhaps slightly thickened mid-distal right ureter/equivocally strictured, but without proximal dilatation. Might need retrograde ureterography in future. Cysto 12/12/2015 WNL.    4. History of TIA on Plavix 75 mg and aspirin 81 mg daily  5. History of tobacco use  6. Continuous exposure to tobacco, wife continues to smoke    Plan:  Reviewed pathology and Bone scan in detail w/ pt and wife. No evidence of metastatic disease  Not a good candidate for RALP based on pathology and co-morbidities.   Recommend radiation therapy +/- ADT 6 months for pt's intermediate risk cancer.   Discussed transurethral resection prior to radiation to improve voiding symptoms. Patient with large median lobe with intravesical component   Pt agree with cysto, b/l rpg, right ureteroscopy, biopsy, right stent placement, with protouch laser enucleation of the prostate  Will require medical clearance, h/o TIA on Plavix 75 mg and ASA 81 mg.  Following the procedure pt to begin radiation+ADT.  Referral to Radiation Oncology (Dr. Shara Blazing) for evaluation of radiation options  All questions answered today        Discussion:   Using NCCN risk stratification criteria, his disease is considered intermediate risk.  We discussed his diagnosis in detail, reviewing the significance of gleason score, PSA, DRE, and percentage of cores positive.  We discussed the various management options for prostate cancer, including active surveillance, open and robotic radical prostatectomy, EBRT, brachytherapy, proton therapy, and cryotherapy.  We discussed the generally indolent course of many prostate cancers, and the generally favorable oncologic control offered by all treatment strategies.  However, I emphasized that all treatments offer only potential for cure and that in many cases multimodal therapy may ultimately be utilized for long term cancer control.  We also discussed the impact of treatment on sexual and urinary function.  I emphasized that each treatment has unique quality of life impact and recovery profiles, and we reviewed the possible effects of surgery, radiation, and cryotherapy on quality of life outcomes.  All questions were answered.    Chief Complaint:   Chief Complaint   Patient presents with   ??? Prostate Cancer       HPI: Colton Cox was referred to discuss his new diagnosis of prostate cancer and definitive treatment options.    Pt underwent prostate biopsy for elevated PSA of 3.75ng/mL.  Following the procedure pt is well. No significant pain or hematuria.   Pt is doing well overall.     Pt returns to review his pathology which is positive for GS 4+3 intermediate risk CaP.   Cores involved include LA 20%, LB 80%, LLM 60%,  and LLB 95%.  GS 3+4 seen at pt's LM 60% and LLA 20% of the specimen.   Also, GS 3+3 seen in RB 5%, RLM 10%, and RLB 5% of the specimen.     In the interim pt completed a Bone scan, results below.   Imaging is negative for osseous metastatic disease.     Prior to diagnosis pt completed a CTU and cysto last month.   Results found a mid-distal right ureter/equivocally strictured, but  without proximal dilatation.  Pt may require a transurethral resection prior to treatment of his prostate cancer.  H/o BPH w/ LUT's, voiding symptoms include nocturia and incomplete emptying.    Symptoms will likely worsen after treatment.     He has no family history of prostate cancer.      Bone pain?  NO  Back pain? NO  Weight Loss?    NO  Decreased appetite?   NO    AUA SS 9 on 11/11/2015   SHIM No flow sheet data.       Dx DATE: 12/16/2015       Past Medical History:   Diagnosis Date   ??? 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 Colon polyps    ??? Hypertension        Past Surgical History:   Procedure Laterality Date   ??? APPENDECTOMY     ??? HX HERNIA REPAIR         Social History   Substance Use Topics   ??? Smoking status: Former Smoker   ??? Smokeless tobacco: Former Systems developer     Quit date: 09/20/1988   ??? Alcohol use Yes       Allergies   Allergen Reactions   ??? Bee Sting  [Sting, Bee] Shortness of Breath       Family History   Problem Relation Age of Onset   ??? Colon Cancer Mother    ??? Colon Cancer Father        Current Outpatient Prescriptions   Medication Sig Dispense Refill   ??? multivitamin (ONE A DAY) tablet Take 1 Tab by mouth daily.     ??? finasteride (PROSCAR) 5 mg tablet Take 1 Tab by mouth daily. 90 Tab 3   ??? tamsulosin (FLOMAX) 0.4 mg capsule Take 1 Cap by mouth daily. 90 Cap 3   ??? FLOVENT HFA 220 mcg/actuation inhaler      ??? aspirin delayed-release 81 mg tablet 81 mg.     ??? clopidogrel (PLAVIX) 75 mg tablet 75 mg.     ??? simvastatin (ZOCOR) 20 mg tablet      ??? losartan-hydroCHLOROthiazide (HYZAAR) 50-12.5 mg per tablet Take 1 Tab by Mouth Once a Day.     ??? mirabegron ER (MYRBETRIQ) 25 mg ER tablet Take 1 Tab by mouth daily. 90 Tab 3         Review of Systems:  Constitutional: Fever: No  Skin: Rash: No  HEENT: Hearing difficulty: No  Eyes: Blurred vision: No  Cardiovascular: Chest pain: No  Respiratory: Shortness of breath: No  Gastrointestinal: Nausea/vomiting: No   Musculoskeletal: Back pain: Yes  Neurological: Weakness: No  Psychological: Memory loss: No  Comments/additional findings:   All other systems reviewed and are negative.    Physical Exam:  Visit Vitals   ??? BP 122/82   ??? Ht 5\' 11"  (1.803 m)   ??? Wt 240 lb (108.9 kg)   ??? BMI 33.47 kg/m2   Constitutional: WDWN, Pleasant and appropriate affect,  No acute distress.    HEENT: EOMs in tact  CV:  No peripheral swelling noted  Respiratory: No respiratory distress or difficulties  Abdomen:  No abdominal masses or tenderness. noted.   Skin: No jaundice.    Neuro/Psych:  Alert and oriented x 3. Affect appropriate.   EXT: no clubbing or cyanosis        Imaging Reviewed:    NM Bone Scan [facility] Body 12/22/2015  IMPRESSION:  No bone scan evidence of osseous metastatic disease.       Previous Biopsies: NO    Pathology reviewed today:  result Gleason Grade cores   12/16/2015 4+3= 7 POS:9 Total:12     Biopsy Pathology 12/16/2015  DIAGNOSIS:   A. Right Apex Needle biopsy ??   ?? ?? ?? ?? ?? ?? ??Benign prostatic tissue.   B. Right Mid Needle biopsy ??   ?? ?? ?? ?? ?? ?? ??Benign prostatic tissue.   C. Right Base Needle biopsy ??   ?? ?? ?? ?? ?? ?? ??ADENOCARCINOMA, GLEASON SCORE 3 + 3 = 6 involving 5 % of the specimen (1 of 1 core(s) positive).   ?? ?? ?? ?? ?? ?? ??Grade Group 1. Ends not involved by the tumor. Perineural invasion.   D. Right Lateral Apex Needle biopsy ??   ?? ?? ?? ?? ?? ?? ??Benign prostatic tissue.   E. Right Lateral Mid Needle biopsy ??   ?? ?? ?? ?? ?? ?? ??ADENOCARCINOMA, GLEASON SCORE 3 + 3 = 6 involving 10 % of the specimen (1 of 1 core(s) positive).   ?? ?? ?? ?? ?? ?? ??Grade Group 1. Ends not involved by the tumor.   F.??Right Lateral Base Needle biopsy ??   ?? ?? ?? ?? ?? ?? ??ADENOCARCINOMA, GLEASON SCORE 3 + 3 = 6 involving 5 % of the specimen (1 of 1 core(s) positive).   ?? ?? ?? ?? ?? ?? ??Grade Group 1. Ends not involved by the tumor.   G. Left Apex Needle biopsy ??   ?? ?? ?? ?? ?? ?? ??ADENOCARCINOMA, GLEASON SCORE 4 + 3 = 7 involving 20 % of the  specimen, discontinuous (1 of 1 core(s) positive).   ?? ?? ?? ?? ?? ?? ??Gleason 4 comprises 70 % of the cancer. Grade Group 3. Ends not involved by the tumor.   H. Left Mid Needle biopsy ??   ?? ?? ?? ?? ?? ?? ??ADENOCARCINOMA, GLEASON SCORE 3 + 4 = 7 involving 60 % of the specimen, discontinuous (1 of 1 core(s) positive).   ?? ?? ?? ?? ?? ?? ??Gleason 4 comprises 20 % of the cancer. Grade Group 2. Tumor involves one end.   I. Left Base Needle biopsy ??   ?? ?? ?? ?? ?? ?? ??ADENOCARCINOMA, GLEASON SCORE 4 + 3 = 7 involving 80 % of the specimen, discontinuous (1 of 1 core(s) positive).   ?? ?? ?? ?? ?? ?? ??Gleason 4 comprises 70 % of the cancer. Grade Group 3. Tumor involves one end. Perineural invasion.   J. Left Lateral Apex Needle biopsy ??   ?? ?? ?? ?? ?? ?? ??ADENOCARCINOMA, GLEASON SCORE 3 + 4 = 7 involving 20 % of the specimen, discontinuous (1 of 1 core(s) positive).   ?? ?? ?? ?? ?? ?? ??Gleason 4 comprises 40 % of the cancer. Grade Group 2. Ends not involved by the tumor.   K. Left Lateral Mid Needle biopsy ??   ?? ?? ?? ?? ?? ?? ??  ADENOCARCINOMA, GLEASON SCORE 4 + 3 = 7 involving 60 % of the specimen (3 of 3 core(s) positive).   ?? ?? ?? ?? ?? ?? ??Gleason 4 comprises 70 % of the cancer. Grade Group 3. Tumor involves one end. Perineural invasion.   L. Left Lateral Base Needle biopsy ??   ?? ?? ?? ?? ?? ?? ??ADENOCARCINOMA, GLEASON SCORE 4 + 3 = 7 involving 95 % of the specimen (1 of 1 core(s) positive).   ?? ?? ?? ?? ?? ?? ??Gleason 4 comprises 70 % of the cancer. Grade Group 3. Ends not involved by the tumor. Perineural invasion.     Nomogram     Primary Treatment Outcomes    Probability of cancer-specific survival after radical prostatectomy [id number]          Progression-Free probability after radical prostatectomy  23YR 68 9YR 53         Extent of Disease Probability    Organ-Confined disease       24    Extracapsular extension       73    Lymph Node involvement       15    Seminal Vesicle invasion       17      Labs reviewed today:         Kathreen Cornfield, MD   [facility]  [place], [place]  Phone: [phone]  Pager: [phone]        CC:  Meredith Leeds, MD         Patient's BMI is out of the normal parameters.  Information about BMI was given to the patient.       Medical Documentation is provided with the assistance of Taj-ud-Din Arsenio Loader, Medical Scribe for Kathreen Cornfield, MD on 12/26/2015.

## 2015-12-28 LAB — URINE C&S

## 2015-12-30 NOTE — Telephone Encounter (Signed)
Called patient to inform him that Dr. Claiborne Billings was referring him to Dr. Shara Blazing, radiation oncologist, and I had scheduled him an appointment.  I told him that the appointment was scheduled on 01/05/16 @ 8:30 AM.  He is aware of the address and thanked me.

## 2016-01-13 NOTE — Telephone Encounter (Signed)
Called patient to confirm surgery time tomorrow. Patient's wife is aware they need to check in at 9:30 AM for a 11:30 AM surgery time at [facility]. She stated they are all ready to go.

## 2016-01-14 ENCOUNTER — Ambulatory Visit: Admit: 2016-01-14 | Payer: MEDICARE | Primary: Family

## 2016-01-14 ENCOUNTER — Inpatient Hospital Stay: Payer: MEDICARE

## 2016-01-14 MED ORDER — GLYCOPYRROLATE 0.2 MG/ML IJ SOLN
0.2 mg/mL | INTRAMUSCULAR | Status: DC | PRN
Start: 2016-01-14 — End: 2016-01-14
  Administered 2016-01-14: 17:00:00 via INTRAVENOUS

## 2016-01-14 MED ORDER — ROCURONIUM 10 MG/ML IV
10 mg/mL | INTRAVENOUS | Status: AC
Start: 2016-01-14 — End: ?

## 2016-01-14 MED ORDER — ONDANSETRON (PF) 4 MG/2 ML INJECTION
4 mg/2 mL | Freq: Once | INTRAMUSCULAR | Status: DC
Start: 2016-01-14 — End: 2016-01-14

## 2016-01-14 MED ORDER — LACTATED RINGERS IV
INTRAVENOUS | Status: DC
Start: 2016-01-14 — End: 2016-01-14
  Administered 2016-01-14: 15:00:00 via INTRAVENOUS

## 2016-01-14 MED ORDER — IOVERSOL 320 MG/ML IV SOLN
320 mg iodine/mL | INTRAVENOUS | Status: AC
Start: 2016-01-14 — End: ?

## 2016-01-14 MED ORDER — ONDANSETRON (PF) 4 MG/2 ML INJECTION
4 mg/2 mL | INTRAMUSCULAR | Status: AC
Start: 2016-01-14 — End: ?

## 2016-01-14 MED ORDER — GLYCOPYRROLATE 0.2 MG/ML IJ SOLN
0.2 mg/mL | INTRAMUSCULAR | Status: AC
Start: 2016-01-14 — End: ?

## 2016-01-14 MED ORDER — PROPOFOL 10 MG/ML IV EMUL
10 mg/mL | INTRAVENOUS | Status: DC | PRN
Start: 2016-01-14 — End: 2016-01-14
  Administered 2016-01-14: 16:00:00 via INTRAVENOUS

## 2016-01-14 MED ORDER — CIPROFLOXACIN 500 MG TAB
500 mg | ORAL_TABLET | Freq: Two times a day (BID) | ORAL | 0 refills | Status: AC
Start: 2016-01-14 — End: 2016-01-17

## 2016-01-14 MED ORDER — OXYBUTYNIN CHLORIDE 5 MG TAB
5 mg | ORAL_TABLET | Freq: Three times a day (TID) | ORAL | 1 refills | Status: AC | PRN
Start: 2016-01-14 — End: 2016-01-19

## 2016-01-14 MED ORDER — FAMOTIDINE 20 MG TAB
20 mg | Freq: Once | ORAL | Status: DC
Start: 2016-01-14 — End: 2016-01-14

## 2016-01-14 MED ORDER — OXYCODONE-ACETAMINOPHEN 5 MG-325 MG TAB
5-325 mg | ORAL_TABLET | ORAL | 0 refills | Status: DC | PRN
Start: 2016-01-14 — End: 2016-08-03

## 2016-01-14 MED ORDER — FENTANYL CITRATE (PF) 50 MCG/ML IJ SOLN
50 mcg/mL | INTRAMUSCULAR | Status: AC
Start: 2016-01-14 — End: ?

## 2016-01-14 MED ORDER — DOCUSATE SODIUM 100 MG CAP
100 mg | ORAL_CAPSULE | Freq: Two times a day (BID) | ORAL | 0 refills | Status: AC
Start: 2016-01-14 — End: 2016-01-29

## 2016-01-14 MED ORDER — HYDROMORPHONE 2 MG/ML INJECTION SOLUTION
2 mg/mL | INTRAMUSCULAR | Status: DC | PRN
Start: 2016-01-14 — End: 2016-01-14

## 2016-01-14 MED ORDER — SUCCINYLCHOLINE CHLORIDE 20 MG/ML INJECTION
20 mg/mL | INTRAMUSCULAR | Status: DC | PRN
Start: 2016-01-14 — End: 2016-01-14
  Administered 2016-01-14: 16:00:00 via INTRAVENOUS

## 2016-01-14 MED ORDER — FENTANYL CITRATE (PF) 50 MCG/ML IJ SOLN
50 mcg/mL | INTRAMUSCULAR | Status: DC | PRN
Start: 2016-01-14 — End: 2016-01-14
  Administered 2016-01-14: 16:00:00 via INTRAVENOUS

## 2016-01-14 MED ORDER — PROPOFOL 10 MG/ML IV EMUL
10 mg/mL | INTRAVENOUS | Status: AC
Start: 2016-01-14 — End: ?

## 2016-01-14 MED ORDER — NEOSTIGMINE METHYLSULFATE 3 MG/3 ML (1 MG/ML) IV SYRINGE
3 mg/ mL (1 mg/mL) | INTRAVENOUS | Status: AC
Start: 2016-01-14 — End: ?

## 2016-01-14 MED ORDER — SUCCINYLCHOLINE CHLORIDE 100 MG/5 ML (20 MG/ML) IV SYRINGE
100 mg/5 mL (20 mg/mL) | INTRAVENOUS | Status: AC
Start: 2016-01-14 — End: ?

## 2016-01-14 MED ORDER — CEFAZOLIN 2 GM/50 ML IN DEXTROSE (ISO-OSMOTIC) IVPB
2 gram/50 mL | INTRAVENOUS | Status: AC
Start: 2016-01-14 — End: 2016-01-14
  Administered 2016-01-14: 16:00:00 via INTRAVENOUS

## 2016-01-14 MED ORDER — MIDAZOLAM 1 MG/ML IJ SOLN
1 mg/mL | INTRAMUSCULAR | Status: DC | PRN
Start: 2016-01-14 — End: 2016-01-14
  Administered 2016-01-14: 15:00:00 via INTRAVENOUS

## 2016-01-14 MED ORDER — FENTANYL CITRATE (PF) 50 MCG/ML IJ SOLN
50 mcg/mL | INTRAMUSCULAR | Status: DC | PRN
Start: 2016-01-14 — End: 2016-01-14

## 2016-01-14 MED ORDER — NEOSTIGMINE METHYLSULFATE 5 MG/5 ML (1 MG/ML) IV SYRINGE
5 mg/ mL (1 mg/mL) | INTRAVENOUS | Status: DC | PRN
Start: 2016-01-14 — End: 2016-01-14
  Administered 2016-01-14: 17:00:00 via INTRAVENOUS

## 2016-01-14 MED ORDER — FAMOTIDINE 20 MG TAB
20 mg | ORAL | Status: AC
Start: 2016-01-14 — End: 2016-01-14
  Administered 2016-01-14: 15:00:00

## 2016-01-14 MED ORDER — IOVERSOL 320 MG/ML IV SOLN
320 mg iodine/mL | INTRAVENOUS | Status: DC | PRN
Start: 2016-01-14 — End: 2016-01-14
  Administered 2016-01-14: 16:00:00

## 2016-01-14 MED ORDER — OXYCODONE-ACETAMINOPHEN 5 MG-325 MG TAB
5-325 mg | Freq: Once | ORAL | Status: DC
Start: 2016-01-14 — End: 2016-01-14

## 2016-01-14 MED ORDER — SODIUM CHLORIDE 0.9 % IRRIGATION SOLN
0.9 % | Status: DC | PRN
Start: 2016-01-14 — End: 2016-01-14
  Administered 2016-01-14: 16:00:00

## 2016-01-14 MED ORDER — SODIUM CHLORIDE 0.9 % IJ SYRG
INTRAMUSCULAR | Status: DC | PRN
Start: 2016-01-14 — End: 2016-01-14

## 2016-01-14 MED ORDER — ONDANSETRON (PF) 4 MG/2 ML INJECTION
4 mg/2 mL | INTRAMUSCULAR | Status: DC | PRN
Start: 2016-01-14 — End: 2016-01-14
  Administered 2016-01-14: 17:00:00 via INTRAVENOUS

## 2016-01-14 MED ORDER — ROCURONIUM 10 MG/ML IV
10 mg/mL | INTRAVENOUS | Status: DC | PRN
Start: 2016-01-14 — End: 2016-01-14
  Administered 2016-01-14 (×2): via INTRAVENOUS

## 2016-01-14 MED ORDER — LACTATED RINGERS IV
INTRAVENOUS | Status: DC
Start: 2016-01-14 — End: 2016-01-14

## 2016-01-14 MED ORDER — NALOXONE 0.4 MG/ML INJECTION
0.4 mg/mL | INTRAMUSCULAR | Status: DC | PRN
Start: 2016-01-14 — End: 2016-01-14

## 2016-01-14 MED ORDER — LIDOCAINE (PF) 20 MG/ML (2 %) IJ SOLN
20 mg/mL (2 %) | INTRAMUSCULAR | Status: DC | PRN
Start: 2016-01-14 — End: 2016-01-14
  Administered 2016-01-14: 16:00:00 via INTRAVENOUS

## 2016-01-14 MED ORDER — MIDAZOLAM 1 MG/ML IJ SOLN
1 mg/mL | INTRAMUSCULAR | Status: AC
Start: 2016-01-14 — End: ?

## 2016-01-14 MED FILL — LACTATED RINGERS IV: INTRAVENOUS | Qty: 1000

## 2016-01-14 MED FILL — ROCURONIUM 10 MG/ML IV: 10 mg/mL | INTRAVENOUS | Qty: 35

## 2016-01-14 MED FILL — GLYCOPYRROLATE 0.2 MG/ML IJ SOLN: 0.2 mg/mL | INTRAMUSCULAR | Qty: 4

## 2016-01-14 MED FILL — DIPRIVAN 10 MG/ML INTRAVENOUS EMULSION: 10 mg/mL | INTRAVENOUS | Qty: 160

## 2016-01-14 MED FILL — ROCURONIUM 10 MG/ML IV: 10 mg/mL | INTRAVENOUS | Qty: 5

## 2016-01-14 MED FILL — MIDAZOLAM 1 MG/ML IJ SOLN: 1 mg/mL | INTRAMUSCULAR | Qty: 2

## 2016-01-14 MED FILL — LACTATED RINGERS IV: INTRAVENOUS | Qty: 500

## 2016-01-14 MED FILL — ONDANSETRON (PF) 4 MG/2 ML INJECTION: 4 mg/2 mL | INTRAMUSCULAR | Qty: 4

## 2016-01-14 MED FILL — ONDANSETRON (PF) 4 MG/2 ML INJECTION: 4 mg/2 mL | INTRAMUSCULAR | Qty: 2

## 2016-01-14 MED FILL — FENTANYL CITRATE (PF) 50 MCG/ML IJ SOLN: 50 mcg/mL | INTRAMUSCULAR | Qty: 2

## 2016-01-14 MED FILL — NEOSTIGMINE METHYLSULFATE 3 MG/3 ML (1 MG/ML) IV SYRINGE: 3 mg/ mL (1 mg/mL) | INTRAVENOUS | Qty: 6

## 2016-01-14 MED FILL — SUCCINYLCHOLINE CHLORIDE 100 MG/5 ML (20 MG/ML) IV SYRINGE: 100 mg/5 mL (20 mg/mL) | INTRAVENOUS | Qty: 100

## 2016-01-14 MED FILL — NEOSTIGMINE METHYLSULFATE 3 MG/3 ML (1 MG/ML) IV SYRINGE: 3 mg/ mL (1 mg/mL) | INTRAVENOUS | Qty: 4

## 2016-01-14 MED FILL — BD POSIFLUSH NORMAL SALINE 0.9 % INJECTION SYRINGE: INTRAMUSCULAR | Qty: 10

## 2016-01-14 MED FILL — GLYCOPYRROLATE 0.2 MG/ML IJ SOLN: 0.2 mg/mL | INTRAMUSCULAR | Qty: 0.6

## 2016-01-14 MED FILL — FAMOTIDINE 20 MG TAB: 20 mg | ORAL | Qty: 1

## 2016-01-14 MED FILL — LIDOCAINE (PF) 20 MG/ML (2 %) IJ SOLN: 20 mg/mL (2 %) | INTRAMUSCULAR | Qty: 40

## 2016-01-14 MED FILL — SUCCINYLCHOLINE CHLORIDE 100 MG/5 ML (20 MG/ML) IV SYRINGE: 100 mg/5 mL (20 mg/mL) | INTRAVENOUS | Qty: 5

## 2016-01-14 MED FILL — OPTIRAY 320 MG IODINE/ML INTRAVENOUS SOLUTION: 320 mg iodine/mL | INTRAVENOUS | Qty: 50

## 2016-01-14 MED FILL — PROPOFOL 10 MG/ML IV EMUL: 10 mg/mL | INTRAVENOUS | Qty: 20

## 2016-01-14 MED FILL — CEFAZOLIN 2 GM/50 ML IN DEXTROSE (ISO-OSMOTIC) IVPB: 2 gram/50 mL | INTRAVENOUS | Qty: 50

## 2016-01-14 MED FILL — FENTANYL CITRATE (PF) 50 MCG/ML IJ SOLN: 50 mcg/mL | INTRAMUSCULAR | Qty: 75

## 2016-01-14 NOTE — Op Note (Signed)
[place]    Name:  Colton Cox, Colton Cox  MR#:  [id number]  DOB:  01/14/1946  Account #:  [id number]  Date of Adm:  01/14/2016  Date of Surgery:  01/14/2016      PREOPERATIVE DIAGNOSES  1. Benign prostatic hypertrophy with obstruction.  2. Intermediate risk prostate cancer.    POSTOPERATIVE DIAGNOSES  1. Benign prostatic hypertrophy with obstruction.  2. Intermediate risk prostate cancer.    PROCEDURES PERFORMED  1. Cystoscopy.  2. Right retrograde pyelogram with intraoperative interpretation.  3. ProTouch laser enucleation of the prostate with tissue morcellation.    SURGEON: Kathreen Cornfield, MD    ASSISTANT: Montine Circle, MD    ANESTHESIA: General.    FLUIDS: Crystalloid.    ESTIMATED BLOOD LOSS: Minimal.    SPECIMENS REMOVED: Bladder chips.    DRAINS: A 22-French 2-way Foley catheter with 30 mL of sterile water  in the balloon.    INTRAOPERATIVE FINDINGS  1. On cystourethroscopy, the patient had no bladder lesions. Ureteral  orifices were in orthotopic position.  2. On right retrograde pyelogram with intraoperative interpretation, the  patient had no hydronephrosis or filling defects. No extravasation was  noted. No suspicious lesions were noted on the retrograde pyelogram.  3. The patient had moderate trilobar hypertrophy with an intravesical  median lobe. At the end of the case, the patient's prostate was wide  open and the patient was noted to have good flow.    INDICATIONS: The patient is a 70 year old male with a history of  clinical T1cNXMX Gleason 4+3 prostate cancer 9/12 cores. He also  had severe urinary tract symptoms, on Flomax, Proscar  and Myrbetriq. The patient was also noted to have significant postvoid  residual. The patient had opted for radiation therapy and therefore it  was decided that prior to induction we would start 6 months of  androgen deprivation therapy and perform an outlet procedure. All  risks, benefits, and alternatives were explained, the  patient decided to  proceed.    DETAILS OF PROCEDURE: The patient was first identified in the  holding area and taken back to the operating room. Perioperative  antibiotics were given and sequential compression devices placed.  Anesthesia was induced. The patient was placed in the dorsal  lithotomy position, taking care to pad all pressure points. The patient  was prepped and draped in the usual sterile fashion. Time-out was  performed.    First, a 22-French rigid cystoscope was inserted into the patient's  bladder. Systematic cystoscopy was performed. An 8-French cone-  tipped catheter was inserted into the right ureter. A right retrograde  pyelogram with intraoperative interpretation was performed. No  abnormal findings were noted. The ureters and renal pelvis were  smooth and without any evidence of filling defect. Once this was done,  we removed the cystoscope and calibrated the urethra to 30-French  with [employer] sounds. A 24-French continuous flow resectoscope  was inserted. Using the ProTouch laser, on 60 watts, we proceeded to  enucleate the gland. We made incisions in the bladder neck, started at  5 and 7, taking them down to the level of the verumontanum. In front of  the veru we then completed our incision in the midline and then in  retrograde fashion enucleated the median lobe. We then carried our 5  o'clock incision around the apex of the prostate. At this point, we made  a 1 o'clock incision at the bladder neck and carried this around  and  made our 5 o'clock incision around the apex of the prostate and then  proceeded to enucleate the left lateral lobe in a retrograde fashion. We  performed the same procedure on the other side, again carrying our 7  o'clock incision around the apex of the prostate. We then made with an  11 o'clock incision, bringing it back towards the apex of the prostate.  We then performed retrograde enucleation in a standard fashion. Once  all 3 lobes were enucleated in the  bladder, we removed the continuous  flow resectoscope and placed the offset nephroscope. Once this was  done and the offset nephroscope was in the bladder, we inserted the  [facility] morcellator and morcellated all 3 lobes of the prostate. Once this  was done, we removed the morcellator and re-inserted the working  element. We assured hemostasis fulgurating any small bleeders. At  this point, we removed the scope and placed a 20-French 2-way Foley  catheter. The patient was awakened from anesthesia and taken back  to the PACU in stable condition. Of note, I was scrubbed and present  for all critical portions of the case.    PLAN: Will be for the catheter to be removed in 5 days. The patient will  then start androgen deprivation therapy and will follow with Dr. Shara Blazing  to have radiation therapy in 8 weeks.        Kathreen Cornfield, MD    DK / [employer]Cheers  D:  01/14/2016   12:49  T:  01/14/2016   13:31  Job #:  [id number]

## 2016-01-14 NOTE — Anesthesia Pre-Procedure Evaluation (Signed)
Anesthetic History   No history of anesthetic complications            Review of Systems / Medical History  Patient summary reviewed and pertinent labs reviewed    Pulmonary  Within defined limits                 Neuro/Psych         TIA     Cardiovascular    Hypertension              Exercise tolerance: >4 METS     GI/Hepatic/Renal  Within defined limits              Endo/Other  Within defined limits           Other Findings   Comments:   Risk Factors for Postoperative nausea/vomiting:       History of postoperative nausea/vomiting?  NO       Male?  NO       Motion sickness?  NO       Intended opioid administration for postoperative analgesia?  YES           Physical Exam    Airway  Mallampati: III  TM Distance: 4 - 6 cm  Neck ROM: normal range of motion   Mouth opening: Normal     Cardiovascular    Rhythm: regular  Rate: normal         Dental    Dentition: Poor dentition     Pulmonary  Breath sounds clear to auscultation               Abdominal  GI exam deferred       Other Findings            Anesthetic Plan    ASA: 3  Anesthesia type: general          Induction: Intravenous  Anesthetic plan and risks discussed with: Patient

## 2016-01-14 NOTE — Anesthesia Post-Procedure Evaluation (Signed)
Post-Anesthesia Evaluation and Assessment    Patient: Colton Cox MRN: [id number]  SSN: [id number]    Date of Birth: 17-Sep-1946  Age: 70 y.o.  Sex: male       Cardiovascular Function/Vital Signs  Visit Vitals   ??? BP 127/67   ??? Pulse (!) 56   ??? Temp 37.3 ??C (99.1 ??F)   ??? Resp 15   ??? Ht 5\' 11"  (1.803 m)   ??? Wt 109.8 kg (242 lb)   ??? SpO2 98%   ??? BMI 33.75 kg/m2       Patient is status post general anesthesia for Procedure(s):  protouch laser  ENUCLEATION OF PROSTATE with right retrograde pyelogram.    Nausea/Vomiting: None    Postoperative hydration reviewed and adequate.    Pain:  Pain Scale 1: Numeric (0 - 10) (01/14/16 1259)  Pain Intensity 1: 2 (01/14/16 1259)   Managed    Neurological Status:   Neuro (WDL): Within Defined Limits (01/14/16 1259)   At baseline    Mental Status and Level of Consciousness: Arousable    Pulmonary Status:   O2 Device: Room air (01/14/16 1259)   Adequate oxygenation and airway patent    Complications related to anesthesia: None    Post-anesthesia assessment completed. No concerns    Signed By: Herbert Seta, MD     January 14, 2016

## 2016-01-14 NOTE — Other (Signed)
Received report from [place]

## 2016-01-14 NOTE — Other (Addendum)
1242  Received pt. Connected pt to monitor. VSS. Assessment preformed. RN at bedside. Will continue to monitor.     17  MD at bedside. Foley to be removed in office and pt not to resume plavix until foley cath d/c.     1300 called to waiting area, no family present for update at this time. Colton Cox at [phone] pt stepdaughter, pt case number verified update given on pt status.

## 2016-01-14 NOTE — Op Note (Signed)
[place]    Name:  Colton Cox, Colton Cox  MR#:  [id number]  DOB:  04-10-1946  Account #:  [id number]  Date of Adm:  01/14/2016  Date of Surgery:  01/14/2016      PREOPERATIVE DIAGNOSES  1. Benign prostatic hypertrophy with obstruction.  2. Intermediate risk prostate cancer.    POSTOPERATIVE DIAGNOSES  1. Benign prostatic hypertrophy with obstruction.  2. Intermediate risk prostate cancer.    PROCEDURES PERFORMED  1. Cystoscopy.  2. Right retrograde pyelogram with intraoperative interpretation.  3. ProTouch laser enucleation of the prostate with tissue morcellation.    SURGEON: Kathreen Cornfield, MD    ASSISTANT: Montine Circle, MD    ANESTHESIA: General.    FLUIDS: Crystalloid.    ESTIMATED BLOOD LOSS: Minimal.    SPECIMENS REMOVED: Bladder chips.    DRAINS: A 22-French 2-way Foley catheter with 30 mL of sterile water  in the balloon.    INTRAOPERATIVE FINDINGS  1. On cystourethroscopy, the patient had no bladder lesions. Ureteral  orifices were in orthotopic position.  2. On right retrograde pyelogram with intraoperative interpretation, the  patient had no hydronephrosis or filling defects. No extravasation was  noted. No suspicious lesions were noted on the retrograde pyelogram.  3. The patient had moderate trilobar hypertrophy with an intravesical  median lobe. At the end of the case, the patient's prostate was wide  open and the patient was noted to have good flow.    INDICATIONS: The patient is a 70 year old male with a history of  clinical T1cNXMX Gleason 4+3 prostate cancer 9/12 cores. He also  had severe urinary tract symptoms, on Flomax, Proscar  and Myrbetriq. The patient was also noted to have significant postvoid  residual. The patient had opted for radiation therapy and therefore it  was decided that prior to induction we would start 6 months of  androgen deprivation therapy and perform an outlet procedure. All   risks, benefits, and alternatives were explained, the patient decided to  proceed.    DETAILS OF PROCEDURE: The patient was first identified in the  holding area and taken back to the operating room. Perioperative  antibiotics were given and sequential compression devices placed.  Anesthesia was induced. The patient was placed in the dorsal  lithotomy position, taking care to pad all pressure points. The patient  was prepped and draped in the usual sterile fashion. Time-out was  performed.    First, a 22-French rigid cystoscope was inserted into the patient's  bladder. Systematic cystoscopy was performed. An 8-French cone-  tipped catheter was inserted into the right ureter. A right retrograde  pyelogram with intraoperative interpretation was performed. No  abnormal findings were noted. The ureters and renal pelvis were  smooth and without any evidence of filling defect. Once this was done,  we removed the cystoscope and calibrated the urethra to 30-French  with [employer] sounds. A 24-French continuous flow resectoscope  was inserted. Using the ProTouch laser, on 60 watts, we proceeded to  enucleate the gland. We made incisions in the bladder neck, started at  5 and 7, taking them down to the level of the verumontanum. In front of  the veru we then completed our incision in the midline and then in  retrograde fashion enucleated the median lobe. We then carried our 5  o'clock incision around the apex of the prostate. At this point, we made  a 1 o'clock incision at the bladder neck and carried this around  and  made our 5 o'clock incision around the apex of the prostate and then  proceeded to enucleate the left lateral lobe in a retrograde fashion. We  performed the same procedure on the other side, again carrying our 7  o'clock incision around the apex of the prostate. We then made with an  11 o'clock incision, bringing it back towards the apex of the prostate.   We then performed retrograde enucleation in a standard fashion. Once  all 3 lobes were enucleated in the bladder, we removed the continuous  flow resectoscope and placed the offset nephroscope. Once this was  done and the offset nephroscope was in the bladder, we inserted the  [facility] morcellator and morcellated all 3 lobes of the prostate. Once this  was done, we removed the morcellator and re-inserted the working  element. We assured hemostasis fulgurating any small bleeders. At  this point, we removed the scope and placed a 20-French 2-way Foley  catheter. The patient was awakened from anesthesia and taken back  to the PACU in stable condition. Of note, I was scrubbed and present  for all critical portions of the case.    PLAN: Will be for the catheter to be removed in 5 days. The patient will  then start androgen deprivation therapy and will follow with Dr. Shara Blazing  to have radiation therapy in 8 weeks.        Kathreen Cornfield, MD    DK / [employer]Cheers  D:  01/14/2016   12:49  T:  01/14/2016   13:31  Job #:  [id number]

## 2016-01-14 NOTE — Brief Op Note (Signed)
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  r97.20,r37.29,n40.1  Postoperative Diagnosis: elevated psa,microscopic hematuria,benign non nodular prostatic hyperplasia with lower    Procedure(s):  protouch laser  ENUCLEATION OF PROSTATE with bilateral retrograde pyelogram with possible right ureteroscopy possible stent placment  Surgeon(s) and Role:     * Kathreen Cornfield, MD - Primary         Assistant Staff:       Surgical Staff:  Circ-1: Vivi Ferns, RN  Circ-Relief: Ma Rings, RN  Radiology Technician: Donnalee Curry  Scrub Tech-1: Garlon Hatchet  Scrub Tech-Relief: Thomasene Lot Felty  Event Time In   Incision Start 11:42 AM   Incision Close      Anesthesia: General   Estimated Blood Loss: minimal  Specimens:   ID Type Source Tests Collected by Time Destination   1 : prostate chips Preservative Prostate  Kathreen Cornfield, MD 01/14/2016 12:28 PM Pathology      Findings: see dicatation   Complications: none  Implants: * No implants in log *

## 2016-01-14 NOTE — H&P (Addendum)
Colton Cox  DOB March 25, 1946  70 y.o.  12/26/2015   ??  History and physical review.  The patient has been examined. There have been no significant clinical changes.    NAD, CTAB, RRR    NA Side marked  Ancef On call to OR  Consented  Plan to proceed with Cysto, DiLEP, b/l rpg, poss urs    Kathreen Cornfield, MD          PROSTATE CANCER NEW DIAGNOSIS  ??        Encounter Diagnoses   ?? ?? ICD-10-CM ICD-9-CM   1. Microscopic hematuria R31.29 599.72   2. Prostate cancer ([place]) C61 185   ??  ??  Assessment:  1. 70 y.o. male with newly diagnosed cT1cNxM0 GS 4+3, Prostate cancer in 9/12 cores, prostate volume of  49.57 cm3, Pre-biopsy PSA= 3.75ng/mL.   2. BPH w/ LUTS on Flomax 0.4 mg, Proscar 5 mg, and Myrbertiq 25 mg daily with improvement of nocturia to x 1-2. Pt continues to note incomplete bladder emptying. Cysto 12/12/2015 shows obstructing prostate; trilobar with moderate median lobe.   3. History of microscopic hematuria, 1+ blood and 5-10 RBC by UA on 11/11/15. Urine cytology 10/2015 showed rare atypical urothelial cells. CT Urogram 11/21/15 showed slightly attenuated and perhaps slightly thickened mid-distal right ureter/equivocally strictured, but without proximal dilatation. Might need retrograde ureterography in future. Cysto 12/12/2015 WNL.   4. History of TIA on Plavix 75 mg and aspirin 81 mg daily  5. History of tobacco use  6. Continuous exposure to tobacco, wife continues to smoke  ??  Plan:  Reviewed pathology and Bone scan in detail w/ pt and wife. No evidence of metastatic disease  Not a good candidate for RALP based on pathology and co-morbidities.   Recommend radiation therapy +/- ADT 6 months for pt's intermediate risk cancer.   Discussed transurethral resection prior to radiation to improve voiding symptoms. Patient with large median lobe with intravesical component   Pt agree with cysto, b/l rpg, right ureteroscopy, biopsy, right stent placement, with protouch laser enucleation of the prostate   Will require medical clearance, h/o TIA on Plavix 75 mg and ASA 81 mg.  Following the procedure pt to begin radiation+ADT.  Referral to Radiation Oncology (Dr. Shara Blazing) for evaluation of radiation options  All questions answered today  ??  ??  ??  Discussion:  Using NCCN risk stratification criteria, his disease is considered intermediate risk. We discussed his diagnosis in detail, reviewing the significance of gleason score, PSA, DRE, and percentage of cores positive. We discussed the various management options for prostate cancer, including active surveillance, open and robotic radical prostatectomy, EBRT, brachytherapy, proton therapy, and cryotherapy. We discussed the generally indolent course of many prostate cancers, and the generally favorable oncologic control offered by all treatment strategies. However, I emphasized that all treatments offer only potential for cure and that in many cases multimodal therapy may ultimately be utilized for long term cancer control. We also discussed the impact of treatment on sexual and urinary function. I emphasized that each treatment has unique quality of life impact and recovery profiles, and we reviewed the possible effects of surgery, radiation, and cryotherapy on quality of life outcomes. All questions were answered.  ??  Chief Complaint:       Chief Complaint   Patient presents with   ??? Prostate Cancer   ??  ??  HPI: Colton Cox was referred to discuss his new diagnosis of prostate cancer and  definitive treatment options.   Pt underwent prostate biopsy for elevated PSA of 3.75ng/mL.  Following the procedure pt is well. No significant pain or hematuria.   Pt is doing well overall.   ??  Pt returns to review his pathology which is positive for GS 4+3 intermediate risk CaP.   Cores involved include LA 20%, LB 80%, LLM 60%, and LLB 95%.  GS 3+4 seen at pt's LM 60% and LLA 20% of the specimen.   Also, GS 3+3 seen in RB 5%, RLM 10%, and RLB 5% of the specimen.   ??   In the interim pt completed a Bone scan, results below.   Imaging is negative for osseous metastatic disease.   ??  Prior to diagnosis pt completed a CTU and cysto last month.   Results found a mid-distal right ureter/equivocally strictured, but without proximal dilatation.  Pt may require a transurethral resection prior to treatment of his prostate cancer.  H/o BPH w/ LUT's, voiding symptoms include nocturia and incomplete emptying.   Symptoms will likely worsen after treatment.   ??  He has no family history of prostate cancer.   ??  Bone pain? NO  Back pain? NO  Weight Loss? NO  Decreased appetite? NO  ??  AUA SS 9 on 11/11/2015   SHIM No flow sheet data.  ????  ??  Dx DATE: 12/16/2015   ??  ??       Past Medical History:   Diagnosis Date   ??? 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   ??? Colon polyps ??   ??? Hypertension ??   ??  ??        Past Surgical History:   Procedure Laterality Date   ??? APPENDECTOMY ?? ??   ??? HX HERNIA REPAIR ?? ??   ??  ??         Social History   Substance Use Topics   ??? Smoking status: Former Smoker   ??? Smokeless tobacco: Former Systems developer   ?? ?? Quit date: 09/20/1988   ??? Alcohol use Yes    ??  ??       Allergies   Allergen Reactions   ??? Bee Sting [Sting, Bee] Shortness of Breath   ??  ??        Family History   Problem Relation Age of Onset   ??? Colon Cancer Mother ??   ??? Colon Cancer Father ??   ??  ??         Current Outpatient Prescriptions   Medication Sig Dispense Refill   ??? multivitamin (ONE A DAY) tablet Take 1 Tab by mouth daily. ?? ??   ??? finasteride (PROSCAR) 5 mg tablet Take 1 Tab by mouth daily. 90 Tab 3   ??? tamsulosin (FLOMAX) 0.4 mg capsule Take 1 Cap by mouth daily. 90 Cap 3   ??? FLOVENT HFA 220 mcg/actuation inhaler ?? ?? ??   ??? aspirin delayed-release 81 mg tablet 81 mg. ?? ??   ??? clopidogrel (PLAVIX) 75 mg tablet 75 mg. ?? ??   ??? simvastatin (ZOCOR) 20 mg tablet ?? ?? ??   ??? losartan-hydroCHLOROthiazide (HYZAAR) 50-12.5 mg per tablet Take 1 Tab by Mouth Once a Day. ?? ??    ??? mirabegron ER (MYRBETRIQ) 25 mg ER tablet Take 1 Tab by mouth daily. 90 Tab 3   ??  ??  ??  Review of Systems:  Constitutional: Fever: No  Skin: Rash: No  HEENT: Hearing difficulty:  No  Eyes: Blurred vision: No  Cardiovascular: Chest pain: No  Respiratory: Shortness of breath: No  Gastrointestinal: Nausea/vomiting: No  Musculoskeletal: Back pain: Yes  Neurological: Weakness: No  Psychological: Memory loss: No  Comments/additional findings:   All other systems reviewed and are negative.  ??  Physical Exam:       Visit Vitals   ??? BP 122/82   ??? Ht 5\' 11"  (1.803 m)   ??? Wt 240 lb (108.9 kg)   ??? BMI 33.47 kg/m2   Constitutional: WDWN, Pleasant and appropriate affect, No acute distress.   HEENT: EOMs in tact  CV: No peripheral swelling noted  Respiratory: No respiratory distress or difficulties  Abdomen: No abdominal masses or tenderness. noted.   Skin: No jaundice.   Neuro/Psych: Alert and oriented x 3. Affect appropriate.   EXT: no clubbing or cyanosis   ??  ??  Imaging Reviewed:  ??  NM Bone Scan [facility] Body 12/22/2015  IMPRESSION:  No bone scan evidence of osseous metastatic disease.   ??  ??  Previous Biopsies: NO  ??  Pathology reviewed today:        result Gleason Grade cores   12/16/2015 4+3= 7 POS:9 Total:12   ??  Biopsy Pathology 12/16/2015  DIAGNOSIS:   A. Right Apex Needle biopsy ??   ?? ?? ?? ?? ?? ?? ??Benign prostatic tissue.   B. Right Mid Needle biopsy ??   ?? ?? ?? ?? ?? ?? ??Benign prostatic tissue.   C. Right Base Needle biopsy ??   ?? ?? ?? ?? ?? ?? ??ADENOCARCINOMA, GLEASON SCORE 3 + 3 = 6 involving 5 % of the specimen (1 of 1 core(s) positive).   ?? ?? ?? ?? ?? ?? ??Grade Group 1. Ends not involved by the tumor. Perineural invasion.   D. Right Lateral Apex Needle biopsy ??   ?? ?? ?? ?? ?? ?? ??Benign prostatic tissue.   E. Right Lateral Mid Needle biopsy ??   ?? ?? ?? ?? ?? ?? ??ADENOCARCINOMA, GLEASON SCORE 3 + 3 = 6 involving 10 % of the specimen (1 of 1 core(s) positive).   ?? ?? ?? ?? ?? ?? ??Grade Group 1. Ends not involved by the tumor.    F.??Right Lateral Base Needle biopsy ??   ?? ?? ?? ?? ?? ?? ??ADENOCARCINOMA, GLEASON SCORE 3 + 3 = 6 involving 5 % of the specimen (1 of 1 core(s) positive).   ?? ?? ?? ?? ?? ?? ??Grade Group 1. Ends not involved by the tumor.   G. Left Apex Needle biopsy ??   ?? ?? ?? ?? ?? ?? ??ADENOCARCINOMA, GLEASON SCORE 4 + 3 = 7 involving 20 % of the specimen, discontinuous (1 of 1 core(s) positive).   ?? ?? ?? ?? ?? ?? ??Gleason 4 comprises 70 % of the cancer. Grade Group 3. Ends not involved by the tumor.   H. Left Mid Needle biopsy ??   ?? ?? ?? ?? ?? ?? ??ADENOCARCINOMA, GLEASON SCORE 3 + 4 = 7 involving 60 % of the specimen, discontinuous (1 of 1 core(s) positive).   ?? ?? ?? ?? ?? ?? ??Gleason 4 comprises 20 % of the cancer. Grade Group 2. Tumor involves one end.   I. Left Base Needle biopsy ??   ?? ?? ?? ?? ?? ?? ??ADENOCARCINOMA, GLEASON SCORE 4 + 3 = 7 involving 80 % of the specimen, discontinuous (1 of 1 core(s) positive).   ?? ?? ?? ?? ?? ?? ??Gleason  4 comprises 70 % of the cancer. Grade Group 3. Tumor involves one end. Perineural invasion.   J. Left Lateral Apex Needle biopsy ??   ?? ?? ?? ?? ?? ?? ??ADENOCARCINOMA, GLEASON SCORE 3 + 4 = 7 involving 20 % of the specimen, discontinuous (1 of 1 core(s) positive).   ?? ?? ?? ?? ?? ?? ??Gleason 4 comprises 40 % of the cancer. Grade Group 2. Ends not involved by the tumor.   K. Left Lateral Mid Needle biopsy ??   ?? ?? ?? ?? ?? ?? ??ADENOCARCINOMA, GLEASON SCORE 4 + 3 = 7 involving 60 % of the specimen (3 of 3 core(s) positive).   ?? ?? ?? ?? ?? ?? ??Gleason 4 comprises 70 % of the cancer. Grade Group 3. Tumor involves one end. Perineural invasion.   L. Left Lateral Base Needle biopsy ??   ?? ?? ?? ?? ?? ?? ??ADENOCARCINOMA, GLEASON SCORE 4 + 3 = 7 involving 95 % of the specimen (1 of 1 core(s) positive).   ?? ?? ?? ?? ?? ?? ??Gleason 4 comprises 70 % of the cancer. Grade Group 3. Ends not involved by the tumor. Perineural invasion.   ??  Nomogram   ??  Primary Treatment Outcomes  ??  Probability of cancer-specific survival after radical prostatectomy 47YR99 144YR99     ????   ??  Progression-Free probability after radical prostatectomy  44YR 68 47YR 53     ??  ??  Extent of Disease Probability  ??  Organ-Confined disease       24  ??  Extracapsular extension       73  ??  Lymph Node involvement       15  ??  Seminal Vesicle invasion       17   ????  Labs reviewed today:   ??  ??  ??  Kathreen Cornfield, MD  [facility]  [place], [place]  Phone: [phone]  Pager: [phone]   ????  ??  CC:  Meredith Leeds, MD   ??  ??

## 2016-01-15 NOTE — Progress Notes (Signed)
Per orders of Dr. Claiborne Billings.  I have referred Colton Cox to the Benefits Dept to begin benefit verification for Eligard 6 months.        01/15/2016   Dosage:  6 months  Frequency: every 6 months   Appt Date: 02/06/16  Dx Code:  [id number]  Patient's ECOG Performance Status:  Cancer Staging:   [name]

## 2016-01-15 NOTE — Progress Notes (Signed)
No auth/deposit required for Eligard.

## 2016-01-15 NOTE — Progress Notes (Signed)
Eligard BV in process.

## 2016-01-20 ENCOUNTER — Institutional Professional Consult (permissible substitution): Admit: 2016-01-20 | Discharge: 2016-01-20 | Payer: MEDICARE | Primary: Family

## 2016-01-20 DIAGNOSIS — R339 Retention of urine, unspecified: Secondary | ICD-10-CM

## 2016-01-20 NOTE — Progress Notes (Signed)
Colton Cox is a 70 y.o. male who is here today for cath removal per Dr.Kelly order.   Dr. Gearldine Bienenstock was available in the clinic as incident to provider.     There were no vitals taken for this visit.     Patient is advised that there will be a slight burning during removal of the catheter. Privacy was provided.       Patient was placed in supine position.     Procedure:    Under clean technique, the balloon was emptied by inserting the barrel of the syringe and withdrawing the amount of fluid used during inflation. The catheter was gently grasped and removed without difficulty. The area was examined and no skin breakdown was noted.   I have visualized the urine and recorded it as being clear     Patient instructed to call office back at 1 pm to let us know how He  is doing/voiding.  I spent approximately 15 minutes with the patient today explaining the importance of notifying our office if He is unable to void. Patient also knows to report any symptoms of fever, chills, urinary frequency, burning, dribbling, hesitation in starting the stream of urine as well as cloudiness or any other unusual color or characteristic of the urine to our office.  Patient is made fully aware that if they are unable to void that they will need to return to our office prior to the close of business today or to the ER if they are unable to void after hours.  Patient verbalized understanding of the above.  Questions were answered.      Patient called back later and stated that He is voiding .         [name]    I was present during visit,  and I concur with plan.    [name]  Given MD   Urologic Oncologist  [facility]  Professor of Urology  [employer]  Office [phone] Ext [phone]

## 2016-01-23 NOTE — Progress Notes (Signed)
Spoke to pt this morning 01/23/16,pt is still having some light bleeding I will discuss with Dr.Kelly this afternoon if its safe to go back to taking his blood thinner. Pt made aware he will get his ADT injection on 02/06/16.   [name]

## 2016-01-23 NOTE — Progress Notes (Signed)
Pt made aware to start his medication back including his blood thinner per Dr.Kelly.  [name]

## 2016-01-28 NOTE — Telephone Encounter (Signed)
Patient called, he has been bleeding on and off since his prostate surgery 01/14/16.  The last couple days he has passed some large clots.  Patient states he is urinating fine, has been emptying his bladder OK.  Per Dr. Claiborne Billings this is normal, as long as he can urinate without issue.  Patient is aware that if he develops any problems urinating, he will call us ASAP.

## 2016-02-06 ENCOUNTER — Ambulatory Visit: Admit: 2016-02-06 | Discharge: 2016-02-06 | Payer: MEDICARE | Attending: Urology | Primary: Family

## 2016-02-06 DIAGNOSIS — C61 Malignant neoplasm of prostate: Secondary | ICD-10-CM

## 2016-02-06 MED ORDER — LEUPROLIDE (6 MONTH) 45 MG SUB-Q SYRINGE
45 mg | INJECTION | Freq: Once | SUBCUTANEOUS | 0 refills | Status: AC
Start: 2016-02-06 — End: 2016-02-06

## 2016-02-06 NOTE — Progress Notes (Signed)
Colton Cox  DOB 24-Dec-1945  70 y.o.      PROSTATE CANCER ESTABLISHED DIAGNOSIS    Encounter Diagnoses     ICD-10-CM ICD-9-CM   1. Prostate cancer ([place]) C61 185   2. Androgen deprivation therapy Z51.81 V58.83    Z79.899 V58.69   3. Benign prostatic hyperplasia, presence of lower urinary tract symptoms unspecified, unspecified morphology N40.0 600.00       Assessment:  1. 70 y.o. male with newly diagnosed cT1cNxM0 GS 4+3, Prostate cancer in 9/12 cores, prostate volume of  49.57 cm3, Pre-biopsy PSA= 3.75ng/mL.    2. BPH w/ LUTS on Flomax 0.4 mg, Proscar 5 mg, and Myrbertiq 25 mg daily with improvement of nocturia to x 1-2. Pt continues to note incomplete bladder emptying. Cysto 12/12/2015 shows obstructing prostate; trilobar with moderate median lobe. S/p ProTouch laser enucleation of the prostate with tissue morcellation on 01/14/2016.  Path benign   3. History of microscopic hematuria, 1+ blood and 5-10 RBC by UA on 11/11/15. Urine cytology 10/2015 showed rare atypical urothelial cells. CT Urogram 11/21/15 showed slightly attenuated and perhaps slightly thickened mid-distal right ureter/equivocally strictured, but without proximal dilatation. Rretrograde ureterography in OR 4/26 w/o evidence of filling defect  4. History of TIA on Plavix 75 mg and aspirin 81 mg daily  5. History of tobacco use  6. Continuous exposure to tobacco, wife continues to smoke    Plan:  Reviewed surgical pathology today.   Begin ADT today w/ Eligard 45 mg.   Begin 1000 units of Calcium and 2000 units of Vit-D.  Follow up w/ Radiation Oncology (Dr. Shara Blazing) for evaluation of radiation options.  Plan for XRT + 63months ADT  All questions answered today.  RTC in 6 months for same day PSA.       Discussion:  Using NCCN risk stratification criteria, his disease is considered intermediate risk.  We discussed his diagnosis in detail, reviewing the significance of gleason score, PSA, DRE, and percentage of cores positive.   We discussed the various management options for prostate cancer, including active surveillance, open and robotic radical prostatectomy, EBRT, brachytherapy, proton therapy, and cryotherapy.  We discussed the generally indolent course of many prostate cancers, and the generally favorable oncologic control offered by all treatment strategies.  However, I emphasized that all treatments offer only potential for cure and that in many cases multimodal therapy may ultimately be utilized for long term cancer control.  We also discussed the impact of treatment on sexual and urinary function.  I emphasized that each treatment has unique quality of life impact and recovery profiles, and we reviewed the possible effects of surgery, radiation, and cryotherapy on quality of life outcomes.  All questions were answered.    Chief Complaint:   Chief Complaint   Patient presents with   ??? Prostate Cancer       HPI: Colton Cox 70 y.o. MALE who returns post-op to discuss his known prostate cancer.   S/p ProTouch laser enucleation of the prostate with tissue morcellation on 01/14/2016.  Pathology negative for CaP, benign nodular hyperplasia seen.  This procedure was completed to improve pt's voiding prior to treatment for CaP.   CTU found a mid-distal right ureter/equivocally strictured, but without proximal dilatation.  RPG in OR normal  Currently not of BPH medication  Intermittent GH  No dysuria    Following the procedure pt experienced gross hematuria.  This has gradually improved and pt notes his urine is becoming lighter.   He  notes significant overall improvement of his FOS. PVR 19    H/o CaP, GS 4+3 intermediate risk CaP.    Bone scan negative for osseous metastatic disease.   Pt referred to Dr. Shara Blazing for radiation consult previously.   He opted for radiation + ADT.   Pt returns to receive his first Eligard 45 mg injection.    He has no family history of prostate cancer.        Bone pain?  NO  Back pain? NO   Weight Loss?    NO  Decreased appetite?   NO    AUA SS 8 on 02/06/2016  SHIM No flow sheet data.       Dx DATE: 12/16/2015       Past Medical History:   Diagnosis Date   ??? Cerebral artery occlusion with cerebral infarction ([place])    ??? Colon polyps    ??? Hypertension        Past Surgical History:   Procedure Laterality Date   ??? APPENDECTOMY     ??? HX HERNIA REPAIR     ??? HX UROLOGICAL N/A 01/14/2016    Cysto, Right RPG, and Protouch laser enucleation of the prostate with tissue morcellation - Dr. Claiborne Billings       Social History   Substance Use Topics   ??? Smoking status: Former Smoker   ??? Smokeless tobacco: Former Systems developer     Quit date: 09/20/1988   ??? Alcohol use Yes       Allergies   Allergen Reactions   ??? Bee Sting  [Sting, Bee] Shortness of Breath       Family History   Problem Relation Age of Onset   ??? Colon Cancer Mother    ??? Colon Cancer Father        Current Outpatient Prescriptions   Medication Sig Dispense Refill   ??? leuprolide, 6 month, (ELIGARD, 6 MONTH,) 45 mg injection 1 Syringe by SubCUTAneous route once for 1 dose. 1 Syringe 0   ??? METHYLSULFONYLMETHANE (MSM PO) Take  by mouth.     ??? multivitamin (ONE A DAY) tablet Take 1 Tab by mouth daily.     ??? finasteride (PROSCAR) 5 mg tablet Take 1 Tab by mouth daily. 90 Tab 3   ??? tamsulosin (FLOMAX) 0.4 mg capsule Take 1 Cap by mouth daily. 90 Cap 3   ??? aspirin delayed-release 81 mg tablet 81 mg.     ??? simvastatin (ZOCOR) 20 mg tablet      ??? losartan-hydroCHLOROthiazide (HYZAAR) 50-12.5 mg per tablet Take 1 Tab by Mouth Once a Day.     ??? mirabegron ER (MYRBETRIQ) 25 mg ER tablet Take 1 Tab by mouth daily. 90 Tab 3   ??? oxyCODONE-acetaminophen (PERCOCET) 5-325 mg per tablet Take 1-2 Tabs by mouth every four (4) hours as needed for Pain. Max Daily Amount: 12 Tabs. 30 Tab 0       Review of Systems:  Constitutional: Fever: No  Skin: Rash: No  HEENT: Hearing difficulty: No  Eyes: Blurred vision: No  Cardiovascular: Chest pain: No  Respiratory: Shortness of breath: No   Gastrointestinal: Nausea/vomiting: No  Musculoskeletal: Back pain:   Neurological: Weakness: No  Psychological: Memory loss: No  Comments/additional findings:   All other systems reviewed and are negative.    Physical Exam:  Visit Vitals   ??? BP 118/78   ??? Ht 5\' 11"  (1.803 m)   ??? Wt 242 lb (109.8 kg)   ??? BMI 33.75 kg/m2  Constitutional: WDWN, Pleasant and appropriate affect, No acute distress.    HEENT: EOMs in tact  CV:  No peripheral swelling noted  Respiratory: No respiratory distress or difficulties  Abdomen:  No abdominal masses or tenderness. noted.   Skin: No jaundice.    Neuro/Psych:  Alert and oriented x 3. Affect appropriate.   EXT: no clubbing or cyanosis        Imaging Reviewed:    NM Bone Scan [facility] Body 12/22/2015  IMPRESSION:  No bone scan evidence of osseous metastatic disease.       Previous Biopsies: NO    Pathology reviewed today:  result Gleason Grade cores   12/16/2015 4+3= 7 POS:9 Total:12     Surgical Pathology 01/15/2016     FINAL DIAGNOSIS:   PROSTATE, TRANSURETHRAL RESECTION:   BENIGN NODULAR HYPERPLASIA       Biopsy Pathology 12/16/2015  DIAGNOSIS:   A. Right Apex Needle biopsy ??   ?? ?? ?? ?? ?? ?? ??Benign prostatic tissue.   B. Right Mid Needle biopsy ??   ?? ?? ?? ?? ?? ?? ??Benign prostatic tissue.   C. Right Base Needle biopsy ??   ?? ?? ?? ?? ?? ?? ??ADENOCARCINOMA, GLEASON SCORE 3 + 3 = 6 involving 5 % of the specimen (1 of 1 core(s) positive).   ?? ?? ?? ?? ?? ?? ??Grade Group 1. Ends not involved by the tumor. Perineural invasion.   D. Right Lateral Apex Needle biopsy ??   ?? ?? ?? ?? ?? ?? ??Benign prostatic tissue.   E. Right Lateral Mid Needle biopsy ??   ?? ?? ?? ?? ?? ?? ??ADENOCARCINOMA, GLEASON SCORE 3 + 3 = 6 involving 10 % of the specimen (1 of 1 core(s) positive).   ?? ?? ?? ?? ?? ?? ??Grade Group 1. Ends not involved by the tumor.   F.??Right Lateral Base Needle biopsy ??   ?? ?? ?? ?? ?? ?? ??ADENOCARCINOMA, GLEASON SCORE 3 + 3 = 6 involving 5 % of the specimen (1 of 1 core(s) positive).    ?? ?? ?? ?? ?? ?? ??Grade Group 1. Ends not involved by the tumor.   G. Left Apex Needle biopsy ??   ?? ?? ?? ?? ?? ?? ??ADENOCARCINOMA, GLEASON SCORE 4 + 3 = 7 involving 20 % of the specimen, discontinuous (1 of 1 core(s) positive).   ?? ?? ?? ?? ?? ?? ??Gleason 4 comprises 70 % of the cancer. Grade Group 3. Ends not involved by the tumor.   H. Left Mid Needle biopsy ??   ?? ?? ?? ?? ?? ?? ??ADENOCARCINOMA, GLEASON SCORE 3 + 4 = 7 involving 60 % of the specimen, discontinuous (1 of 1 core(s) positive).   ?? ?? ?? ?? ?? ?? ??Gleason 4 comprises 20 % of the cancer. Grade Group 2. Tumor involves one end.   I. Left Base Needle biopsy ??   ?? ?? ?? ?? ?? ?? ??ADENOCARCINOMA, GLEASON SCORE 4 + 3 = 7 involving 80 % of the specimen, discontinuous (1 of 1 core(s) positive).   ?? ?? ?? ?? ?? ?? ??Gleason 4 comprises 70 % of the cancer. Grade Group 3. Tumor involves one end. Perineural invasion.   J. Left Lateral Apex Needle biopsy ??   ?? ?? ?? ?? ?? ?? ??ADENOCARCINOMA, GLEASON SCORE 3 + 4 = 7 involving 20 % of the specimen, discontinuous (1 of 1 core(s) positive).   ?? ?? ?? ?? ?? ?? ??Gleason 4 comprises  40 % of the cancer. Grade Group 2. Ends not involved by the tumor.   K. Left Lateral Mid Needle biopsy ??   ?? ?? ?? ?? ?? ?? ??ADENOCARCINOMA, GLEASON SCORE 4 + 3 = 7 involving 60 % of the specimen (3 of 3 core(s) positive).   ?? ?? ?? ?? ?? ?? ??Gleason 4 comprises 70 % of the cancer. Grade Group 3. Tumor involves one end. Perineural invasion.   L. Left Lateral Base Needle biopsy ??   ?? ?? ?? ?? ?? ?? ??ADENOCARCINOMA, GLEASON SCORE 4 + 3 = 7 involving 95 % of the specimen (1 of 1 core(s) positive).   ?? ?? ?? ?? ?? ?? ??Gleason 4 comprises 70 % of the cancer. Grade Group 3. Ends not involved by the tumor. Perineural invasion.       Nomogram     Primary Treatment Outcomes    Probability of cancer-specific survival after radical prostatectomy [id number]          Progression-Free probability after radical prostatectomy  55YR 68 13YR 53         Extent of Disease Probability    Organ-Confined disease       24     Extracapsular extension       73    Lymph Node involvement       15    Seminal Vesicle invasion       17      Labs reviewed today:         Kathreen Cornfield, MD  [facility]  [place], [place]  Phone: [phone]  Pager: [phone]        CC:  Meredith Leeds, MD         Patient's BMI is out of the normal parameters.  Information about BMI was given to the patient.       Medical Documentation is provided with the assistance of Taj-ud-Din Arsenio Loader, Medical Scribe for Kathreen Cornfield, MD on 02/06/2016.

## 2016-02-06 NOTE — Progress Notes (Signed)
TEODORO LOTA is a 70 y.o. male who is here today per the order of Dr. Claiborne Billings to receive Eligard 45 mg.   Patient's identity has been verified.   Dr. Claiborne Billings was available in the clinic as incident to provider.     Eligard 45 mg  is administered to abdomen ( left) IM without difficulty.       Patient tolerated the injection well.    Patient has been scheduled for the next injection which will be due in approximately 6 months      Orders Placed This Encounter   ??? CHEMOTHER HORMON ANTINEOPL SUB-Q/IM   ??? LEUPROLIDE ACETATE SUSPNSION     Order Specific Question:   Dose     Answer:   45 mg     Order Specific Question:   Site     Answer:   LEFT UPPER QUAD ABDOMEN     Order Specific Question:   Expiration Date     Answer:   07/21/2017     Order Specific Question:   Lot#     Answer:   [id number]     Order Specific Question:   Manufacturer     Answer:   [employer]     Order Specific Question:   Charge Quantity?     Answer:   6     Order Specific Question:   Perfomed by/Witnessed by:     Answer:   [name]     Order Specific Question:   NDC#     Answer:   [phone]   ??? leuprolide, 6 month, (ELIGARD, 6 MONTH,) 45 mg injection     Sig: 1 Syringe by SubCUTAneous route once for 1 dose.     Dispense:  1 Syringe     Refill:  0       Patient did not supply own medication.     [name]

## 2016-08-03 ENCOUNTER — Ambulatory Visit: Admit: 2016-08-03 | Discharge: 2016-08-03 | Payer: MEDICARE | Attending: Urology | Primary: Family

## 2016-08-03 DIAGNOSIS — C61 Malignant neoplasm of prostate: Secondary | ICD-10-CM

## 2016-08-03 LAB — PROSTATE SPECIFIC ANTIGEN, TOTAL (PSA): Prostate Specific Ag: <0.03 ng/mL (ref 0–4)

## 2016-08-03 LAB — AMB POC URINALYSIS DIP STICK AUTO W/O MICRO
Bilirubin (UA POC): NEGATIVE
Glucose (UA POC): NEGATIVE
Ketones (UA POC): NEGATIVE
Leukocyte esterase (UA POC): NEGATIVE
Nitrites (UA POC): NEGATIVE
Protein (UA POC): NEGATIVE
Specific gravity (UA POC): 1.02 (ref 1.001–1.035)
Urobilinogen (UA POC): 0.2 (ref 0.2–1)
pH (UA POC): 5.5 (ref 4.6–8.0)

## 2016-08-03 LAB — TESTOSTERONE: Testosterone: <3 ng/dL — ABNORMAL LOW (ref 348–1197)

## 2016-08-03 LAB — AMB POC PVR, MEAS,POST-VOID RES,US,NON-IMAGING: PVR: 120 cc

## 2016-08-03 MED ORDER — LEUPROLIDE (6 MONTH) 45 MG SUB-Q SYRINGE
45 mg | INJECTION | Freq: Once | SUBCUTANEOUS | 0 refills | Status: DC
Start: 2016-08-03 — End: 2016-08-03

## 2016-08-03 NOTE — Progress Notes (Signed)
Colton Cox presents today for lab draw per Dr. Claiborne Billings order.    Patient will be notified by the physician with lab results.    PSA and TST obtained via venipuncture without any difficulty.     Orders Placed This Encounter   ??? TESTOSTERONE   ??? VITAMIN D, 25 HYDROXY   ??? CALCIUM   ??? PROSTATE SPECIFIC ANTIGEN, TOTAL (PSA)   ??? PR COLLECTION VENOUS BLOOD,VENIPUNCTURE   ??? CHEMOTHER HORMON ANTINEOPL SUB-Q/IM   ??? LEUPROLIDE ACETATE SUSPNSION   ??? leuprolide, 6 month, (ELIGARD, 6 MONTH,) 45 mg injection     Sig: 1 Syringe by SubCUTAneous route once for 1 dose.     Dispense:  1 Syringe     Refill:  0

## 2016-08-03 NOTE — Progress Notes (Signed)
Colton Cox  DOB November 13, 1945  70 y.o.      PROSTATE CANCER ESTABLISHED DIAGNOSIS    Encounter Diagnoses     ICD-10-CM ICD-9-CM   1. Prostate cancer ([place]) C61 185   2. Vitamin D deficiency E55.9 268.9   3. Benign prostatic hyperplasia with lower urinary tract symptoms, symptom details unspecified N40.1 600.01   4. Nocturia R35.1 788.43       Assessment:  1. 70 y.o. male with newly diagnosed cT1cNxM0 GS 4+3, Prostate cancer in 9/12 cores, prostate volume of  49.57 cm3, Pre-biopsy PSA= 3.75ng/mL.     Bone scan on 12/22/15 negative for metastatic prostate cancer.  CTU 2/17 negative   ADT initiated on 02/06/16 with Eligard 45 mg injection.   Completed XRT 03/18/16-04/23/16.   2. BPH w/ LUTS on Flomax 0.4 mg, Proscar 5 mg, and Myrbertiq 25 mg daily with improvement of nocturia to x 1-2. Pt continues to note incomplete bladder emptying. Cysto 12/12/2015 shows obstructing prostate; trilobar with moderate median lobe. PVR today is 120 cc.    S/p ProTouch laser enucleation of the prostate with tissue morcellation on 01/14/2016.  Path benign   3. History of microscopic hematuria, 1+ blood and 5-10 RBC by UA on 11/11/15. Urine cytology 10/2015 showed rare atypical urothelial cells. CT Urogram 11/21/15 showed slightly attenuated and perhaps slightly thickened mid-distal right ureter/equivocally strictured, but without proximal dilatation. Rretrograde ureterography in OR 4/26 w/o evidence of filling defect  4. History of TIA on Plavix 75 mg and aspirin 81 mg daily  5. History of tobacco use  6. Continuous exposure to tobacco, wife continues to smoke    Plan:  ?? PSA and T are pending today.  Will inform with results  ?? Recommend black cohosh for ADT hot flashes.  ?? Explained that hot flashes should continue to improve as his T increases as he becomes further out from his last ADT injection.  ?? PVR today is 120 cc.   ?? Discontinue Flomax, Proscar, and Myrbetriq. Advised patient to let us  know if he develops bothersome urinary symptoms off of medications and we will have him resume medication accordingly.   ?? Continue 1000 units of Calcium and 2000 units of Vit-D.  Will stop in T improved at next visit  ?? RTC in 3 months for follow-up with PSA, T, Ca, and Vit D and PVR      Chief Complaint:   Chief Complaint   Patient presents with   ??? Prostate Cancer       HPI: Colton Cox 71 y.o. MALE who returns for follow-up of his prostate cancer. Patient underwent prostate biopsy on 12/12/15, which revealed GS 4+3 intermediate risk CaP in 9/12 cores.  Bone scan on 12/22/15 negative for osseous metastatic disease.   Pt referred to Dr. Shara Blazing for radiation consult. He opted for radiation + ADT.   S/p ProTouch laser enucleation of the prostate with tissue morcellation on 01/14/2016.  Pathology negative for CaP, benign nodular hyperplasia seen.  This procedure was completed to improve pt's voiding prior to treatment for CaP.   ADT initiated on 02/06/16 with Eligard 45 mg injection.  Completed XRT 03/18/16-04/23/16.     He endorses significant hot flashes related to ADT. He is interested in treatment for this. He denies any bone pain or back pain. Stable weight and appetite.     He notes significant overall improvement of his FOS following his procedure on 01/14/16. Currently taking Proscar, Flomax, and Myrbetriq with benefit. Patient  denies any bothersome urinary complaints. He notes that he had one episode of what felt like urinary retention, but denies any episodes recently. Otherwise, patient denies any incomplete bladder emptying, urinary frequency, intermittency, urinary urgency, weak FOS, straining, or nocturia. PVR today is 120 cc.     No gross hematuria or dysuria. Denies fevers, chills, nausea, or emesis.     He has no family history of prostate cancer.      AUA SS 6 (2) today 08/03/16, previously 8 on 02/06/2016  SHIM No flow sheet data.       Dx DATE: 12/12/2015     PSA Trend  Date PSA    Latest Ref Rng & Units 0.00 - 4.00 ng/mL   11/03/2015 3.75   08/07/2015 5.40 (H)       Past Medical History:   Diagnosis Date   ??? Cerebral artery occlusion with cerebral infarction [facility])    ??? Colon polyps    ??? Hypertension        Past Surgical History:   Procedure Laterality Date   ??? APPENDECTOMY     ??? HX HERNIA REPAIR     ??? HX OTHER SURGICAL  06/2016    Cat Scan: spot noted on lung   ??? HX UROLOGICAL N/A 01/14/2016    Cysto, Right RPG, and Protouch laser enucleation of the prostate with tissue morcellation - Dr. Claiborne Billings       Social History   Substance Use Topics   ??? Smoking status: Former Smoker   ??? Smokeless tobacco: Former Systems developer     Quit date: 09/20/1988   ??? Alcohol use Yes       Allergies   Allergen Reactions   ??? Bee Sting  [Sting, Bee] Shortness of Breath       Family History   Problem Relation Age of Onset   ??? Colon Cancer Mother    ??? Colon Cancer Father        Current Outpatient Prescriptions   Medication Sig Dispense Refill   ??? clopidogrel (PLAVIX) 75 mg tab Take  by mouth.     ??? calcium cit-vit D3-isoflavon 2 100-1,000-80 mg-unit-mg tab Take  by mouth.     ??? cholecalciferol, vitamin D3, (VITAMIN D3) 2,000 unit tab Take  by mouth.     ??? multivitamin (ONE A DAY) tablet Take 1 Tab by mouth daily.     ??? finasteride (PROSCAR) 5 mg tablet Take 1 Tab by mouth daily. 90 Tab 3   ??? tamsulosin (FLOMAX) 0.4 mg capsule Take 1 Cap by mouth daily. 90 Cap 3   ??? aspirin delayed-release 81 mg tablet 81 mg.     ??? simvastatin (ZOCOR) 20 mg tablet      ??? losartan-hydroCHLOROthiazide (HYZAAR) 50-12.5 mg per tablet Take 1 Tab by Mouth Once a Day.     ??? mirabegron ER (MYRBETRIQ) 25 mg ER tablet Take 1 Tab by mouth daily. 90 Tab 3       Review of Systems:  Constitutional: Fever: No  Skin: Rash: No  HEENT: Hearing difficulty: Yes  b/l hearing aids  Eyes: Blurred vision: No  Cardiovascular: Chest pain: No  Respiratory: Shortness of breath: No  Gastrointestinal: Nausea/vomiting: No  Musculoskeletal: Back pain: No   Neurological: Weakness: No  Psychological: Memory loss: No  Comments/additional findings:   All other systems reviewed and are negative.    Physical Exam:  Visit Vitals   ??? BP 122/80   ??? Ht 5\' 11"  (1.803 m)   ??? Wt  245 lb (111.1 kg)   ??? BMI 34.17 kg/m2      Constitutional: WDWN, Pleasant and appropriate affect, No acute distress.    HEENT: EOMs in tact  CV:  No peripheral swelling noted  Respiratory: No respiratory distress or difficulties  Abdomen:  No abdominal masses or tenderness. noted.   Skin: No jaundice.    Neuro/Psych:  Alert and oriented x 3. Affect appropriate.   EXT: no clubbing or cyanosis        Imaging:  NM Bone Scan [facility] Body 12/22/2015  IMPRESSION:  No bone scan evidence of osseous metastatic disease.       Previous Biopsies: NO    Surgical Pathology 01/14/2016   FINAL DIAGNOSIS:   PROSTATE, TRANSURETHRAL RESECTION:   BENIGN NODULAR HYPERPLASIA    Initial Prostate Biopsy Pathology:  result Gleason Grade cores   12/12/2015 4+3= 7 POS:9 Total:12     Biopsy Pathology 12/12/2015  DIAGNOSIS:   A. Right Apex Needle biopsy ??   ?? ?? ?? ?? ?? ?? ??Benign prostatic tissue.   B. Right Mid Needle biopsy ??   ?? ?? ?? ?? ?? ?? ??Benign prostatic tissue.   C. Right Base Needle biopsy ??   ?? ?? ?? ?? ?? ?? ??ADENOCARCINOMA, GLEASON SCORE 3 + 3 = 6 involving 5 % of the specimen (1 of 1 core(s) positive).   ?? ?? ?? ?? ?? ?? ??Grade Group 1. Ends not involved by the tumor. Perineural invasion.   D. Right Lateral Apex Needle biopsy ??   ?? ?? ?? ?? ?? ?? ??Benign prostatic tissue.   E. Right Lateral Mid Needle biopsy ??   ?? ?? ?? ?? ?? ?? ??ADENOCARCINOMA, GLEASON SCORE 3 + 3 = 6 involving 10 % of the specimen (1 of 1 core(s) positive).   ?? ?? ?? ?? ?? ?? ??Grade Group 1. Ends not involved by the tumor.   F.??Right Lateral Base Needle biopsy ??   ?? ?? ?? ?? ?? ?? ??ADENOCARCINOMA, GLEASON SCORE 3 + 3 = 6 involving 5 % of the specimen (1 of 1 core(s) positive).   ?? ?? ?? ?? ?? ?? ??Grade Group 1. Ends not involved by the tumor.   G. Left Apex Needle biopsy ??    ?? ?? ?? ?? ?? ?? ??ADENOCARCINOMA, GLEASON SCORE 4 + 3 = 7 involving 20 % of the specimen, discontinuous (1 of 1 core(s) positive).   ?? ?? ?? ?? ?? ?? ??Gleason 4 comprises 70 % of the cancer. Grade Group 3. Ends not involved by the tumor.   H. Left Mid Needle biopsy ??   ?? ?? ?? ?? ?? ?? ??ADENOCARCINOMA, GLEASON SCORE 3 + 4 = 7 involving 60 % of the specimen, discontinuous (1 of 1 core(s) positive).   ?? ?? ?? ?? ?? ?? ??Gleason 4 comprises 20 % of the cancer. Grade Group 2. Tumor involves one end.   I. Left Base Needle biopsy ??   ?? ?? ?? ?? ?? ?? ??ADENOCARCINOMA, GLEASON SCORE 4 + 3 = 7 involving 80 % of the specimen, discontinuous (1 of 1 core(s) positive).   ?? ?? ?? ?? ?? ?? ??Gleason 4 comprises 70 % of the cancer. Grade Group 3. Tumor involves one end. Perineural invasion.   J. Left Lateral Apex Needle biopsy ??   ?? ?? ?? ?? ?? ?? ??ADENOCARCINOMA, GLEASON SCORE 3 + 4 = 7 involving 20 % of the specimen, discontinuous (1 of 1 core(s) positive).   ?? ?? ?? ?? ?? ?? ??  Gleason 4 comprises 40 % of the cancer. Grade Group 2. Ends not involved by the tumor.   K. Left Lateral Mid Needle biopsy ??   ?? ?? ?? ?? ?? ?? ??ADENOCARCINOMA, GLEASON SCORE 4 + 3 = 7 involving 60 % of the specimen (3 of 3 core(s) positive).   ?? ?? ?? ?? ?? ?? ??Gleason 4 comprises 70 % of the cancer. Grade Group 3. Tumor involves one end. Perineural invasion.   L. Left Lateral Base Needle biopsy ??   ?? ?? ?? ?? ?? ?? ??ADENOCARCINOMA, GLEASON SCORE 4 + 3 = 7 involving 95 % of the specimen (1 of 1 core(s) positive).   ?? ?? ?? ?? ?? ?? ??Gleason 4 comprises 70 % of the cancer. Grade Group 3. Ends not involved by the tumor. Perineural invasion.       Nomogram     Primary Treatment Outcomes    Probability of cancer-specific survival after radical prostatectomy [id number]          Progression-Free probability after radical prostatectomy  53YR 68 80YR 53         Extent of Disease Probability    Organ-Confined disease       24    Extracapsular extension       73    Lymph Node involvement       15     Seminal Vesicle invasion       17      Labs reviewed today:   Recent Results (from the past 24 hour(s))   TESTOSTERONE    Collection Time: 08/03/16 11:22 AM   Result Value Ref Range    Testosterone <3 (L) 348 - 1197 ng/dL   PROSTATE SPECIFIC ANTIGEN, TOTAL (PSA)    Collection Time: 08/03/16 11:22 AM   Result Value Ref Range    Prostate Specific Ag <0.03  0 - 4 ng/mL   AMB POC URINALYSIS DIP STICK AUTO W/O MICRO    Collection Time: 08/03/16 11:57 AM   Result Value Ref Range    Color (UA POC)      Clarity (UA POC)      Glucose (UA POC) Negative Negative    Bilirubin (UA POC) Negative Negative    Ketones (UA POC) Negative Negative    Specific gravity (UA POC) 1.020 1.001 - 1.035    Blood (UA POC) 1+ Negative    pH (UA POC) 5.5 4.6 - 8.0    Protein (UA POC) Negative Negative    Urobilinogen (UA POC) 0.2 mg/dL 0.2 - 1    Nitrites (UA POC) Negative Negative    Leukocyte esterase (UA POC) Negative Negative   AMB POC PVR, MEAS,POST-VOID RES,US,NON-IMAGING    Collection Time: 08/03/16 12:02 PM   Result Value Ref Range    PVR 120 cc         Kathreen Cornfield, MD  [facility]  [place], [place]  Phone: [phone]  Pager: [phone]        CC:  Meredith Leeds, MD         Patient's BMI is out of the normal parameters.  Information about BMI was given to the patient.       Medical documentation entered into the chart by Theador Hawthorne, medical scribe for Kathreen Cornfield, MD on 08/03/2016.

## 2016-08-05 NOTE — Progress Notes (Signed)
PSA undetectable, Testosterone low.  Labs look great follow up as scheduled

## 2016-10-26 ENCOUNTER — Ambulatory Visit: Admit: 2016-10-26 | Discharge: 2016-10-26 | Payer: MEDICARE | Attending: Urology | Primary: Family

## 2016-10-26 DIAGNOSIS — C61 Malignant neoplasm of prostate: Secondary | ICD-10-CM

## 2016-10-26 LAB — AMB POC URINALYSIS DIP STICK AUTO W/O MICRO
Bilirubin (UA POC): NEGATIVE
Glucose (UA POC): NEGATIVE
Ketones (UA POC): NEGATIVE
Leukocyte esterase (UA POC): NEGATIVE
Nitrites (UA POC): NEGATIVE
Protein (UA POC): NEGATIVE
Specific gravity (UA POC): 1.02 (ref 1.001–1.035)
Urobilinogen (UA POC): 0.2 (ref 0.2–1)
pH (UA POC): 6 (ref 4.6–8.0)

## 2016-10-26 LAB — AMB POC PVR, MEAS,POST-VOID RES,US,NON-IMAGING: PVR: 12 cc

## 2016-10-26 LAB — TESTOSTERONE: Testosterone: <3 ng/dL — ABNORMAL LOW (ref 348–1197)

## 2016-10-26 LAB — PROSTATE SPECIFIC ANTIGEN, TOTAL (PSA): Prostate Specific Ag: <0.03 ng/mL (ref 0–4)

## 2016-10-26 NOTE — Progress Notes (Signed)
Colton Cox presents today for lab draw per Dr. Claiborne Billings order.    Patient will be notified by the physician with lab results.    PSA, TST, Vit D, Calcium obtained via venipuncture without any difficulty.  By Dellia Cloud    Orders Placed This Encounter   ??? PROSTATE SPECIFIC ANTIGEN, TOTAL (PSA)   ??? TESTOSTERONE   ??? VITAMIN D, 25 HYDROXY   ??? CALCIUM   ??? COLLECTION VENOUS BLOOD,VENIPUNCTURE       Sheralyn Boatman, LPN

## 2016-10-26 NOTE — Progress Notes (Signed)
KODIE ANGST  DOB 08/05/1946  71 y.o.      PROSTATE CANCER ESTABLISHED DIAGNOSIS    Encounter Diagnoses     ICD-10-CM ICD-9-CM   1. Prostate cancer ([place]) C61 185   2. Vitamin D deficiency E55.9 268.9   3. Nocturia R35.1 788.43   4. Lower urinary tract symptoms (LUTS) R39.9 788.99       Assessment:  1. 71 y.o. male with newly diagnosed cT1cNxM0 GS 4+3, Prostate cancer in 9/12 cores, prostate volume of  49.57 cm3, Pre-biopsy PSA= 3.75ng/mL.     Bone scan on 12/22/15 negative for metastatic prostate cancer. CTU 2/17 negative   ADT initiated on 02/06/16 with Eligard 45 mg injection.  Single dose   Completed XRT 03/18/16-04/23/16 with Dr. Tobe Sos    Most recent PSA: <0.03 ng/mL on 10/27/15    2. BPH w/ LUTS. S/p ProTouch laser enucleation of the prostate with tissue morcellation on 01/14/2016.  Path benign. PVR 12 cc today.    - Cysto 12/12/2015 shows obstructing prostate; trilobar with moderate median lobe. PVR today is 120 cc.       3. History of microscopic hematuria, 1+ blood and 5-10 RBC by UA on 11/11/15.    Urine cytology 10/2015 showed rare atypical urothelial cells.    CT Urogram 11/21/15 showed slightly attenuated and perhaps slightly thickened mid-distal right ureter/equivocally strictured, but without proximal dilatation. Rretrograde ureterography in OR 4/26 w/o evidence of filling defect    4. History of TIA on Plavix 75 mg and aspirin 81 mg daily  5. History of tobacco use  6. Continuous exposure to tobacco, wife continues to smoke    Plan:  PVR today 12 cc - voiding with minimal LUTs  PSA and testosterone drawn today and reviewed with patient - PSA undetectable, T <3  Calcium and vitamin D drawn today - results pending   Offered Megace for hot flashes, patient defers  Recommend black cohash BID for hot flashes   Continue 1000 units of Calcium and 2000 units of Vit-D.  Testicle benign upon exam, no masses  Tech visit in 3 months for PSA  Follow up in 6 months with PSA and Testosterone. If testosterone continues  to be low and hot flashes present, will consider further treatment       Chief Complaint:   Chief Complaint   Patient presents with   ??? Prostate Cancer   ??? Benign Prostatic Hypertrophy   ??? (LUTS) Lower Urinary Tract Symptoms       HPI: LATTIE CLARIDA 71 y.o. MALE who returns for follow-up of his prostate cancer. Patient underwent prostate biopsy on 12/12/15, which revealed GS 4+3 intermediate risk CaP in 9/12 cores. He has no family history of prostate cancer.    Bone scan on 12/22/15 negative for osseous metastatic disease.   Pt referred to Dr. Shara Blazing for radiation consult. He opted for radiation + ADT.   S/p ProTouch laser enucleation of the prostate with tissue morcellation on 01/14/2016.  Pathology negative for CaP, benign nodular hyperplasia seen.  This procedure was completed to improve pt's voiding prior to treatment for CaP.   ADT initiated on 02/06/16 with Eligard 45 mg injection.  Completed XRT 03/18/16-04/23/16.   Marland Kitchen   Patient is doing well today. Continues to have good results from ProTouch procedure, specifically to stream. No longer taking any GU meds. Still experiencing hot flashes- tried black cohash. He states they are more prominenet at night. For exercise he has been walking daily. He  does complain of some new testicle pain. The pain is described as an ache. He plans to get more supportive underwear.       Dx DATE: 12/12/2015     PSA Trend  Date PSA   Latest Ref Rng & Units 0.00 - 4.00 ng/mL   11/03/2015 3.75   08/07/2015 5.40 (H)       Past Medical History:   Diagnosis Date   ??? Cerebral artery occlusion with cerebral infarction [facility])    ??? Colon polyps    ??? Hypertension        Past Surgical History:   Procedure Laterality Date   ??? APPENDECTOMY     ??? HX HERNIA REPAIR     ??? HX OTHER SURGICAL  06/2016    Cat Scan: spot noted on lung   ??? HX UROLOGICAL N/A 01/14/2016    Cysto, Right RPG, and Protouch laser enucleation of the prostate with tissue morcellation - Dr. Claiborne Billings       Social History    Substance Use Topics   ??? Smoking status: Former Smoker   ??? Smokeless tobacco: Former Systems developer     Quit date: 09/20/1988   ??? Alcohol use Yes       Allergies   Allergen Reactions   ??? Bee Sting  [Sting, Bee] Shortness of Breath       Family History   Problem Relation Age of Onset   ??? Colon Cancer Mother    ??? Colon Cancer Father        Current Outpatient Prescriptions   Medication Sig Dispense Refill   ??? clopidogrel (PLAVIX) 75 mg tab Take  by mouth.     ??? calcium cit-vit D3-isoflavon 2 100-1,000-80 mg-unit-mg tab Take  by mouth.     ??? cholecalciferol, vitamin D3, (VITAMIN D3) 2,000 unit tab Take  by mouth.     ??? multivitamin (ONE A DAY) tablet Take 1 Tab by mouth daily.     ??? aspirin delayed-release 81 mg tablet 81 mg.     ??? simvastatin (ZOCOR) 20 mg tablet      ??? losartan-hydroCHLOROthiazide (HYZAAR) 50-12.5 mg per tablet Take 1 Tab by Mouth Once a Day.         Review of Systems:  Constitutional: Fever: No  Skin: Rash: No  HEENT: Hearing difficulty: No  Eyes: Blurred vision: No  Cardiovascular: Chest pain: No  Respiratory: Shortness of breath: No  Gastrointestinal: Nausea/vomiting: No  Musculoskeletal: Back pain: No  Neurological: Weakness: No  Psychological: Memory loss: No  Comments/additional findings:   All other systems reviewed and are negative.    Physical Exam:  Visit Vitals   ??? BP 126/80   ??? Ht 5\' 11"  (1.803 m)   ??? Wt 245 lb (111.1 kg)   ??? BMI 34.17 kg/m2     Constitutional: WDWN, Pleasant and appropriate affect, No acute distress.    Respiratory: No respiratory distress or difficulties  GU Male:    SCROTUM:  No scrotal rash or lesions noticed.  Normal bilateral testes and epididymis.   PENIS: Urethral meatus normal in location and size. No urethral discharge.  Skin: No jaundice.    Neuro/Psych:  Alert and oriented x 3. Affect appropriate.   .      Imaging:  NM Bone Scan [facility] Body 12/22/2015  IMPRESSION:  No bone scan evidence of osseous metastatic disease.       Previous Biopsies: NO    Surgical Pathology 01/14/2016    FINAL DIAGNOSIS:  PROSTATE, TRANSURETHRAL RESECTION:   BENIGN NODULAR HYPERPLASIA    Initial Prostate Biopsy Pathology:  result Gleason Grade cores   12/12/2015 4+3= 7 POS:9 Total:12     Biopsy Pathology 12/12/2015  DIAGNOSIS:   A. Right Apex Needle biopsy ??   ?? ?? ?? ?? ?? ?? ??Benign prostatic tissue.   B. Right Mid Needle biopsy ??   ?? ?? ?? ?? ?? ?? ??Benign prostatic tissue.   C. Right Base Needle biopsy ??   ?? ?? ?? ?? ?? ?? ??ADENOCARCINOMA, GLEASON SCORE 3 + 3 = 6 involving 5 % of the specimen (1 of 1 core(s) positive).   ?? ?? ?? ?? ?? ?? ??Grade Group 1. Ends not involved by the tumor. Perineural invasion.   D. Right Lateral Apex Needle biopsy ??   ?? ?? ?? ?? ?? ?? ??Benign prostatic tissue.   E. Right Lateral Mid Needle biopsy ??   ?? ?? ?? ?? ?? ?? ??ADENOCARCINOMA, GLEASON SCORE 3 + 3 = 6 involving 10 % of the specimen (1 of 1 core(s) positive).   ?? ?? ?? ?? ?? ?? ??Grade Group 1. Ends not involved by the tumor.   F.??Right Lateral Base Needle biopsy ??   ?? ?? ?? ?? ?? ?? ??ADENOCARCINOMA, GLEASON SCORE 3 + 3 = 6 involving 5 % of the specimen (1 of 1 core(s) positive).   ?? ?? ?? ?? ?? ?? ??Grade Group 1. Ends not involved by the tumor.   G. Left Apex Needle biopsy ??   ?? ?? ?? ?? ?? ?? ??ADENOCARCINOMA, GLEASON SCORE 4 + 3 = 7 involving 20 % of the specimen, discontinuous (1 of 1 core(s) positive).   ?? ?? ?? ?? ?? ?? ??Gleason 4 comprises 70 % of the cancer. Grade Group 3. Ends not involved by the tumor.   H. Left Mid Needle biopsy ??   ?? ?? ?? ?? ?? ?? ??ADENOCARCINOMA, GLEASON SCORE 3 + 4 = 7 involving 60 % of the specimen, discontinuous (1 of 1 core(s) positive).   ?? ?? ?? ?? ?? ?? ??Gleason 4 comprises 20 % of the cancer. Grade Group 2. Tumor involves one end.   I. Left Base Needle biopsy ??   ?? ?? ?? ?? ?? ?? ??ADENOCARCINOMA, GLEASON SCORE 4 + 3 = 7 involving 80 % of the specimen, discontinuous (1 of 1 core(s) positive).   ?? ?? ?? ?? ?? ?? ??Gleason 4 comprises 70 % of the cancer. Grade Group 3. Tumor involves one end. Perineural invasion.   J. Left Lateral Apex Needle biopsy ??    ?? ?? ?? ?? ?? ?? ??ADENOCARCINOMA, GLEASON SCORE 3 + 4 = 7 involving 20 % of the specimen, discontinuous (1 of 1 core(s) positive).   ?? ?? ?? ?? ?? ?? ??Gleason 4 comprises 40 % of the cancer. Grade Group 2. Ends not involved by the tumor.   K. Left Lateral Mid Needle biopsy ??   ?? ?? ?? ?? ?? ?? ??ADENOCARCINOMA, GLEASON SCORE 4 + 3 = 7 involving 60 % of the specimen (3 of 3 core(s) positive).   ?? ?? ?? ?? ?? ?? ??Gleason 4 comprises 70 % of the cancer. Grade Group 3. Tumor involves one end. Perineural invasion.   L. Left Lateral Base Needle biopsy ??   ?? ?? ?? ?? ?? ?? ??ADENOCARCINOMA, GLEASON SCORE 4 + 3 = 7 involving 95 % of the specimen (1 of 1 core(s) positive).   ?? ?? ?? ?? ?? ?? ??  Gleason 4 comprises 70 % of the cancer. Grade Group 3. Ends not involved by the tumor. Perineural invasion.       Nomogram     Primary Treatment Outcomes    Probability of cancer-specific survival after radical prostatectomy [id number]          Progression-Free probability after radical prostatectomy  43YR 68 82YR 53         Extent of Disease Probability    Organ-Confined disease       24    Extracapsular extension       73    Lymph Node involvement       15    Seminal Vesicle invasion       17      Labs reviewed today:   Recent Results (from the past 24 hour(s))   PROSTATE SPECIFIC ANTIGEN, TOTAL (PSA)    Collection Time: 10/26/16 11:01 AM   Result Value Ref Range    Prostate Specific Ag <0.03  0 - 4 ng/mL   TESTOSTERONE    Collection Time: 10/26/16 11:01 AM   Result Value Ref Range    Testosterone <3 (L) 348 - 1197 ng/dL   AMB POC URINALYSIS DIP STICK AUTO W/O MICRO    Collection Time: 10/26/16 12:06 PM   Result Value Ref Range    Color (UA POC)      Clarity (UA POC)      Glucose (UA POC) Negative Negative    Bilirubin (UA POC) Negative Negative    Ketones (UA POC) Negative Negative    Specific gravity (UA POC) 1.020 1.001 - 1.035    Blood (UA POC) 1+ Negative    pH (UA POC) 6.0 4.6 - 8.0    Protein (UA POC) Negative Negative     Urobilinogen (UA POC) 0.2 mg/dL 0.2 - 1    Nitrites (UA POC) Negative Negative    Leukocyte esterase (UA POC) Negative Negative   AMB POC PVR, MEAS,POST-VOID RES,US,NON-IMAGING    Collection Time: 10/26/16 12:06 PM   Result Value Ref Range    PVR 12 cc         Kathreen Cornfield, MD  [facility]  [place], [place]  Phone: [phone]  Pager: [phone]        CC:  Meredith Leeds, MD         Patient's BMI is out of the normal parameters.  Information about BMI was given to the patient.     Medical documentation provided with the assistance of Aileen Fass, medical scribe for Kathreen Cornfield, MD.

## 2016-10-27 LAB — CALCIUM: Calcium: 9.1 mg/dL (ref 8.6–10.2)

## 2016-10-28 LAB — VITAMIN D, 25 HYDROXY: VITAMIN D, 25-HYDROXY: 25.1 ng/ml — ABNORMAL LOW (ref 30.0–100.0)

## 2016-10-28 NOTE — Progress Notes (Signed)
Please inform patient Vit D is low.  Start Vit D 2 (ergocalciferol) 50,000 international units weekly x 8 weeks then start Vit D2 or D3 2,000 international units daily

## 2016-10-29 MED ORDER — ERGOCALCIFEROL (VITAMIN D2) 50,000 UNIT CAP
1250 mcg (50,000 unit) | ORAL_CAPSULE | ORAL | 0 refills | Status: DC
Start: 2016-10-29 — End: 2016-10-29

## 2016-10-29 MED ORDER — ERGOCALCIFEROL (VITAMIN D2) 50,000 UNIT CAP
1250 mcg (50,000 unit) | ORAL_CAPSULE | ORAL | 0 refills | Status: DC
Start: 2016-10-29 — End: 2017-04-26

## 2016-10-29 NOTE — Addendum Note (Signed)
Addended by: Consuello Masse on: 10/29/2016 04:53 PM      Modules accepted: Orders

## 2016-11-03 ENCOUNTER — Encounter: Attending: Urology | Primary: Family

## 2017-01-25 ENCOUNTER — Institutional Professional Consult (permissible substitution): Admit: 2017-01-25 | Discharge: 2017-01-25 | Payer: MEDICARE | Primary: Family

## 2017-01-25 DIAGNOSIS — C61 Malignant neoplasm of prostate: Secondary | ICD-10-CM

## 2017-01-25 NOTE — Progress Notes (Signed)
Colton Cox presents today for lab draw per Dr. Claiborne Billings order.    Patient will be notified with lab results.    PSA obtained via venipuncture without any difficulty.      Orders Placed This Encounter   ??? PROSTATE SPECIFIC ANTIGEN, TOTAL (PSA)   ??? COLLECTION VENOUS BLOOD,VENIPUNCTURE       Overton Mam

## 2017-01-26 LAB — PROSTATE SPECIFIC ANTIGEN, TOTAL (PSA): Prostate Specific Ag: <0.03 ng/mL (ref 0–4)

## 2017-01-27 NOTE — Progress Notes (Signed)
Forwarded to pt

## 2017-04-26 ENCOUNTER — Ambulatory Visit: Admit: 2017-04-26 | Discharge: 2017-04-26 | Payer: MEDICARE | Attending: Urology | Primary: Family

## 2017-04-26 DIAGNOSIS — C61 Malignant neoplasm of prostate: Secondary | ICD-10-CM

## 2017-04-26 LAB — AMB POC URINALYSIS DIP STICK AUTO W/O MICRO
Bilirubin (UA POC): NEGATIVE
Glucose (UA POC): NEGATIVE
Ketones (UA POC): NEGATIVE
Leukocyte esterase (UA POC): NEGATIVE
Nitrites (UA POC): NEGATIVE
Protein (UA POC): NEGATIVE
Specific gravity (UA POC): 1.015 (ref 1.001–1.035)
Urobilinogen (UA POC): 0.2 (ref 0.2–1)
pH (UA POC): 7 (ref 4.6–8.0)

## 2017-04-26 LAB — TESTOSTERONE: Testosterone: 51 ng/dL — ABNORMAL LOW (ref 348–1197)

## 2017-04-26 LAB — PROSTATE SPECIFIC ANTIGEN, TOTAL (PSA): Prostate Specific Ag: 0.04 ng/mL (ref 0.00–4.00)

## 2017-04-26 NOTE — Progress Notes (Signed)
Colton Cox presents today for lab draw per Dr. Evette Georges order.    Patient will be notified with lab results.    PSA and TST obtained via venipuncture without any difficulty.      Orders Placed This Encounter   ??? PROSTATE SPECIFIC ANTIGEN, TOTAL (PSA)   ??? TESTOSTERONE   ??? COLLECTION VENOUS BLOOD,VENIPUNCTURE       Colton Cox   Labs were drawn by Charlotte Sanes.

## 2017-04-26 NOTE — Progress Notes (Signed)
Colton Cox  DOB 1945-09-25  71 y.o.      PROSTATE CANCER ESTABLISHED DIAGNOSIS    Encounter Diagnoses     ICD-10-CM ICD-9-CM   1. Prostate cancer ([place]) C61 185       Assessment:  1. 71 y.o. male with cT1cNxM0 GS 4+3, Prostate cancer in 9/12 cores, prostate volume of  49.57 cm3, Pre-biopsy PSA= 3.75ng/mL.     Bone scan on 12/22/15 negative for metastatic prostate cancer. CTU 2/17 negative   ADT initiated on 02/06/16 with Eligard 45 mg injection.  Single dose   Completed XRT 03/18/16-04/23/16 with Dr. Tobe Sos    Most recent PSA: <0.03 ng/mL on 01/25/17    2. 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 No urinary complaints.      3. History of microscopic hematuria, 1+ blood and 5-10 RBC by UA on 11/11/15.   Urine cytology 10/2015 showed rare atypical urothelial cells.    CT Urogram 11/21/15 showed slightly attenuated and perhaps slightly thickened mid-distal right ureter/equivocally strictured, but without proximal dilatation. Rretrograde ureterography in OR 4/26 w/o evidence of filling defect   UA today only with trace blood (04/26/2017)    4. Vit D Deficiency on Vit D 2000I U daily    4. History of TIA on Plavix 75 mg and aspirin 81 mg daily  5. History of tobacco use    Plan:  PSA and testosterone drawn today- pending. Will forward results to patient  Continue 1000 units of Calcium and 2000 units of Vit-D.  Follow up in 6 months with PSA, T, Vit D    I am following the established plan for Dr. Claiborne Billings and Dr. Claiborne Billings is the supervising physician for this day.      Chief Complaint:   Chief Complaint   Patient presents with   ??? Prostate Cancer     cT1cNxM0 GS 4+3, Prostate cancer in 9/12 cores, prostate volume of  49.57 cm3, Pre-biopsy PSA= 3.75ng/mL.        HPI: Colton Cox 71 y.o. MALE who returns for follow-up of his prostate cancer. Patient is doing well today. Continues to have good results from ProTouch procedure, specifically with a good urinary stream. No longer  taking any GU meds. Still experiencing hot flashes- tried black cohash. He states they are more prominenet at night but they continue to improve. He is no longer drenched in sweat. For exercise he has been walking daily. He reports resolved testicular pain. No gross hematuria or dysuria.    Previous Hjstory  Patient underwent prostate biopsy on 12/12/15, which revealed GS 4+3 intermediate risk CaP in 9/12 cores. Bone scan on 12/22/15 negative for osseous metastatic disease. He opted for radiation + ADT. He is s/p ProTouch laser enucleation of the prostate with tissue morcellation on 01/14/2016. ADT initiated on 02/06/16 with Eligard 45 mg injection.  Completed XRT 03/18/16-04/23/16.     Past Medical History:   Diagnosis Date   ??? Cerebral artery occlusion with cerebral infarction [facility])    ??? Colon polyps    ??? Hypertension        Past Surgical History:   Procedure Laterality Date   ??? APPENDECTOMY     ??? HX HERNIA REPAIR     ??? HX OTHER SURGICAL  06/2016    Cat Scan: spot noted on lung   ??? HX UROLOGICAL N/A 01/14/2016    Cysto, Right RPG, and Protouch laser enucleation of the prostate with tissue morcellation -  Dr. Claiborne Billings       Social History   Substance Use Topics   ??? Smoking status: Former Smoker   ??? Smokeless tobacco: Former Systems developer     Quit date: 09/20/1988   ??? Alcohol use Yes       Allergies   Allergen Reactions   ??? Bee Sting  [Sting, Bee] Shortness of Breath       Family History   Problem Relation Age of Onset   ??? Colon Cancer Mother    ??? Colon Cancer Father        Current Outpatient Prescriptions   Medication Sig Dispense Refill   ??? EPIPEN 2-PAK 0.3 mg/0.3 mL injection      ??? ibuprofen (MOTRIN) 800 mg tablet      ??? meloxicam (MOBIC) 15 mg tablet Take 15 mg by mouth daily.     ??? clopidogrel (PLAVIX) 75 mg tab Take  by mouth.     ??? calcium cit-vit D3-isoflavon 2 100-1,000-80 mg-unit-mg tab Take  by mouth.     ??? cholecalciferol, vitamin D3, (VITAMIN D3) 2,000 unit tab Take  by mouth.      ??? multivitamin (ONE A DAY) tablet Take 1 Tab by mouth daily.     ??? aspirin delayed-release 81 mg tablet 81 mg.     ??? simvastatin (ZOCOR) 20 mg tablet      ??? losartan-hydroCHLOROthiazide (HYZAAR) 50-12.5 mg per tablet Take 1 Tab by Mouth Once a Day.         Review of Systems:  Constitutional: Fever: No  Skin: Rash: No  HEENT: Hearing difficulty: No  Eyes: Blurred vision: No  Cardiovascular: Chest pain: No  Respiratory: Shortness of breath: No  Gastrointestinal: Nausea/vomiting: No  Musculoskeletal: Back pain: No  Neurological: Weakness: No  Psychological: Memory loss: No  Comments/additional findings:   All other systems reviewed and are negative.    Physical Exam:  Visit Vitals   ??? BP 110/70   ??? Ht 5\' 11"  (1.803 m)   ??? Wt 243 lb (110.2 kg)   ??? BMI 33.89 kg/m2     Constitutional: WDWN, Pleasant and appropriate affect, No acute distress.    Respiratory: No respiratory distress or difficulties  Skin: No jaundice.    Neuro/Psych:  Alert and oriented. Affect appropriate.   .    Imaging:  NM Bone Scan [facility] Body 12/22/2015  IMPRESSION:  No bone scan evidence of osseous metastatic disease.     Surgical Pathology 01/14/2016  FINAL DIAGNOSIS:   PROSTATE, TRANSURETHRAL RESECTION:   BENIGN NODULAR HYPERPLASIA    Prostate Biopsy Pathology:  result Gleason Grade cores   12/12/2015 4+3= 7 POS:9 Total:12     Biopsy Pathology 12/12/2015  DIAGNOSIS:   A. Right Apex Needle biopsy ??   ?? ?? ?? ?? ?? ?? ??Benign prostatic tissue.   B. Right Mid Needle biopsy ??   ?? ?? ?? ?? ?? ?? ??Benign prostatic tissue.   C. Right Base Needle biopsy ??   ?? ?? ?? ?? ?? ?? ??ADENOCARCINOMA, GLEASON SCORE 3 + 3 = 6 involving 5 % of the specimen (1 of 1 core(s) positive).   ?? ?? ?? ?? ?? ?? ??Grade Group 1. Ends not involved by the tumor. Perineural invasion.   D. Right Lateral Apex Needle biopsy ??   ?? ?? ?? ?? ?? ?? ??Benign prostatic tissue.   E. Right Lateral Mid Needle biopsy ??   ?? ?? ?? ?? ?? ?? ??ADENOCARCINOMA, GLEASON SCORE 3 + 3 =  6 involving 10 % of the  specimen (1 of 1 core(s) positive).   ?? ?? ?? ?? ?? ?? ??Grade Group 1. Ends not involved by the tumor.   F.??Right Lateral Base Needle biopsy ??   ?? ?? ?? ?? ?? ?? ??ADENOCARCINOMA, GLEASON SCORE 3 + 3 = 6 involving 5 % of the specimen (1 of 1 core(s) positive).   ?? ?? ?? ?? ?? ?? ??Grade Group 1. Ends not involved by the tumor.   G. Left Apex Needle biopsy ??   ?? ?? ?? ?? ?? ?? ??ADENOCARCINOMA, GLEASON SCORE 4 + 3 = 7 involving 20 % of the specimen, discontinuous (1 of 1 core(s) positive).   ?? ?? ?? ?? ?? ?? ??Gleason 4 comprises 70 % of the cancer. Grade Group 3. Ends not involved by the tumor.   H. Left Mid Needle biopsy ??   ?? ?? ?? ?? ?? ?? ??ADENOCARCINOMA, GLEASON SCORE 3 + 4 = 7 involving 60 % of the specimen, discontinuous (1 of 1 core(s) positive).   ?? ?? ?? ?? ?? ?? ??Gleason 4 comprises 20 % of the cancer. Grade Group 2. Tumor involves one end.   I. Left Base Needle biopsy ??   ?? ?? ?? ?? ?? ?? ??ADENOCARCINOMA, GLEASON SCORE 4 + 3 = 7 involving 80 % of the specimen, discontinuous (1 of 1 core(s) positive).   ?? ?? ?? ?? ?? ?? ??Gleason 4 comprises 70 % of the cancer. Grade Group 3. Tumor involves one end. Perineural invasion.   J. Left Lateral Apex Needle biopsy ??   ?? ?? ?? ?? ?? ?? ??ADENOCARCINOMA, GLEASON SCORE 3 + 4 = 7 involving 20 % of the specimen, discontinuous (1 of 1 core(s) positive).   ?? ?? ?? ?? ?? ?? ??Gleason 4 comprises 40 % of the cancer. Grade Group 2. Ends not involved by the tumor.   K. Left Lateral Mid Needle biopsy ??   ?? ?? ?? ?? ?? ?? ??ADENOCARCINOMA, GLEASON SCORE 4 + 3 = 7 involving 60 % of the specimen (3 of 3 core(s) positive).   ?? ?? ?? ?? ?? ?? ??Gleason 4 comprises 70 % of the cancer. Grade Group 3. Tumor involves one end. Perineural invasion.   L. Left Lateral Base Needle biopsy ??   ?? ?? ?? ?? ?? ?? ??ADENOCARCINOMA, GLEASON SCORE 4 + 3 = 7 involving 95 % of the specimen (1 of 1 core(s) positive).   ?? ?? ?? ?? ?? ?? ??Gleason 4 comprises 70 % of the cancer. Grade Group 3. Ends not involved by the tumor. Perineural invasion.     Labs reviewed today:    Recent Results (from the past 24 hour(s))   AMB POC URINALYSIS DIP STICK AUTO W/O MICRO    Collection Time: 04/26/17 11:02 AM   Result Value Ref Range    Color (UA POC)      Clarity (UA POC)      Glucose (UA POC) Negative Negative    Bilirubin (UA POC) Negative Negative    Ketones (UA POC) Negative Negative    Specific gravity (UA POC) 1.015 1.001 - 1.035    Blood (UA POC) Trace Negative    pH (UA POC) 7.0 4.6 - 8.0    Protein (UA POC) Negative Negative    Urobilinogen (UA POC) 0.2 mg/dL 0.2 - 1    Nitrites (UA POC) Negative Negative    Leukocyte esterase (UA POC) Negative Negative     PSA /  TESTOSTERONE - B[facility]SI PSA Testosterone, total   Latest Ref Rng & Units 0 - 4 ng/mL 348 - 1197 ng/dL   04/26/2017 pending pending   01/25/2017 <0.03    10/26/2016 <0.03 <3 (L)   08/03/2016 <0.03 <3 (L)   11/03/2015 3.75    08/07/2015 5.40 (H)        Teodora Medici PA-C  [facility]  [place], [place]       CC:  Jarome Matin MCCABE, PA-C     Patient's BMI is out of the normal parameters.  Information about BMI was given to the patient.

## 2017-04-28 NOTE — Progress Notes (Signed)
Testosterone improving, PSA 0.04.  Looks good, follow up as scheduled

## 2017-10-05 ENCOUNTER — Institutional Professional Consult (permissible substitution): Admit: 2017-10-05 | Discharge: 2017-10-05 | Primary: Family

## 2017-10-05 DIAGNOSIS — C61 Malignant neoplasm of prostate: Secondary | ICD-10-CM

## 2017-10-05 LAB — TESTOSTERONE: Testosterone: 131 ng/dL — ABNORMAL LOW (ref 348–1197)

## 2017-10-05 LAB — PROSTATE SPECIFIC ANTIGEN, TOTAL (PSA): Prostate Specific Ag: 0.08 ng/mL (ref 0.00–4.00)

## 2017-10-05 LAB — VITAMIN D, 25 HYDROXY: VITAMIN D, 25-HYDROXY: 28.4 ng/ml — ABNORMAL LOW (ref 30.0–100.0)

## 2017-10-05 NOTE — Progress Notes (Signed)
Noted. Forwarded to patient. Will review at follow-up. Patient needs Vit D repletion. Start Vit D 50,000 i.u - one tab each week for 8 weeks. thanks

## 2017-10-05 NOTE — Progress Notes (Signed)
Colton Cox presents today for lab draw per Dr. Claiborne Billings  order.   Dr. Quentin Ore  was present in the clinic as incident to.     PSA, TST and Vitamin D obtained via venipuncture without any difficulty.    Patient will be notified with lab results.       Orders Placed This Encounter   ??? PROSTATE SPECIFIC ANTIGEN, TOTAL (PSA)   ??? TESTOSTERONE   ??? VITAMIN D, 25 HYDROXY   ??? COLLECTION VENOUS BLOOD,VENIPUNCTURE       Shabree Tebbetts U Madden Garron

## 2017-10-07 MED ORDER — ERGOCALCIFEROL (VITAMIN D2) 50,000 UNIT CAP
1250 mcg (50,000 unit) | ORAL_CAPSULE | ORAL | 0 refills | Status: DC
Start: 2017-10-07 — End: 2017-10-07

## 2017-10-07 MED ORDER — ERGOCALCIFEROL (VITAMIN D2) 50,000 UNIT CAP
1250 mcg (50,000 unit) | ORAL_CAPSULE | ORAL | 0 refills | Status: DC
Start: 2017-10-07 — End: 2018-03-14

## 2017-10-07 NOTE — Addendum Note (Signed)
Addended by: Consuello Masse on: 10/07/2017 01:57 PM     Modules accepted: Orders

## 2017-10-18 ENCOUNTER — Encounter: Primary: Family

## 2017-10-25 ENCOUNTER — Encounter: Attending: Urology | Primary: Family

## 2018-03-14 ENCOUNTER — Ambulatory Visit: Admit: 2018-03-14 | Discharge: 2018-03-14 | Attending: Urology | Primary: Family

## 2018-03-14 ENCOUNTER — Ambulatory Visit: Attending: Urology | Primary: Family

## 2018-03-14 DIAGNOSIS — C61 Malignant neoplasm of prostate: Secondary | ICD-10-CM

## 2018-03-14 LAB — AMB POC URINALYSIS DIP STICK AUTO W/O MICRO
Bilirubin (UA POC): NEGATIVE
Bilirubin, Urine, POC: NEGATIVE
Glucose (UA POC): NEGATIVE
Glucose, Urine, POC: NEGATIVE
Ketones (UA POC): NEGATIVE
Ketones, Urine, POC: NEGATIVE
Leukocyte Esterase, Urine, POC: NEGATIVE
Leukocyte esterase (UA POC): NEGATIVE
Nitrite, Urine, POC: NEGATIVE
Nitrites (UA POC): NEGATIVE
Protein (UA POC): NEGATIVE
Protein, Urine, POC: NEGATIVE
Specific Gravity, Urine, POC: 1.02 NA (ref 1.001–1.035)
Specific gravity (UA POC): 1.02 (ref 1.001–1.035)
Urobilinogen (UA POC): 0.2 (ref 0.2–1)
Urobilinogen, POC: 0.2 (ref 0.2–1)
pH (UA POC): 5 (ref 4.6–8.0)
pH, Urine, POC: 5 NA (ref 4.6–8.0)

## 2018-03-14 LAB — TESTOSTERONE
Testosterone: 184 ng/dL — ABNORMAL LOW (ref 348–1197)
Testosterone: 184 ng/dL — ABNORMAL LOW (ref 348–1197)

## 2018-03-14 LAB — PROSTATE SPECIFIC ANTIGEN, TOTAL (PSA)
PSA: 0.11 ng/mL (ref 0.00–4.00)
Prostate Specific Ag: 0.11 ng/mL (ref 0.00–4.00)

## 2018-03-14 NOTE — Progress Notes (Signed)
Noted. Forwarded to patient.

## 2018-03-14 NOTE — Progress Notes (Signed)
Please inform patient Vit D is low.  Start Vit D 2 (ergocalciferol) 50,000 international units weekly x 8 weeks then start Vit D2 or D3 2,000 international units daily

## 2018-03-14 NOTE — Progress Notes (Signed)
 Colton Cox presents today for lab draw per Dr. Joesphine order.     PSA, TST and Vitamin D  obtained via venipuncture without any difficulty.    Patient will be notified with lab results.     Orders Placed This Encounter   . PROSTATE SPECIFIC ANTIGEN, TOTAL (PSA)   . TESTOSTERONE   . VITAMIN D , 25 HYDROXY   . COLLECTION VENOUS BLOOD,VENIPUNCTURE       [name]

## 2018-03-14 NOTE — Progress Notes (Signed)
Progress Notes by Teodora Medici, PA-C at 03/14/18 1100                Author: Teodora Medici, PA-C  Service: --  Author Type: Physician Assistant       Filed: 03/14/18 1132  Encounter Date: 03/14/2018  Status: Signed          Editor: Charyl Dancer (Physician Assistant)                       [name]   DOB 1946/09/12   72 y.o.      PROSTATE CANCER ESTABLISHED DIAGNOSIS        Encounter Diagnoses              ICD-10-CM  ICD-9-CM          1.  Prostate cancer ([place])  C61  185     2.  Vitamin D deficiency  E55.9  268.9          3.  History of hematuria  Z87.448  V13.09        Assessment:   1. 72 y.o. male with cT1cNxM0 GS 4+3, Prostate cancer in 9/12 cores, prostate volume of   49.57 cm3, Pre-biopsy PSA= 3.75ng/mL.      Bone scan on 12/22/15 negative for metastatic prostate cancer. CTU 2/17 negative    ADT initiated on 02/06/16 with Eligard 45 mg injection.  Single dose    Completed XRT 03/18/16-04/23/16 with Dr. Tobe Sos     Most recent PSA: 0.11 ng/mL on 03/14/2018 with T no longer castrate and increasing (184)      2. 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 No urinary complaints.        3. History of microscopic hematuria, 1+ blood and 5-10 RBC by UA on 11/11/15.    Urine cytology 10/2015 showed rare atypical urothelial cells.     CT Urogram 11/21/15 showed slightly attenuated and perhaps slightly thickened mid-distal right ureter/equivocally strictured, but without proximal dilatation. Rretrograde ureterography in OR 4/26 w/o evidence of filling defect    No gross hematuria      4. 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 Vit D today pending      4. History of TIA on Plavix 75 mg and aspirin 81 mg daily   5. History of tobacco use      Plan:   PSA and testosterone reviewed with patient- 0.11 ng/ml and 184, respectively   Advised patient that slight PSA increase with T increase is not unexpected and recommend continued close monitor to assess for stability    Vit D today pending- will forward patient results   Continue 1000 units of Calcium and 2000 units of Vit-D.   Nurse visit in 3 months for PSA, T per patient request due to work commitments   Follow up in 6 months with PSA, T      I am following the established plan for Dr. Claiborne Billings and Dr. Claiborne Billings is the supervising physician for this day.      Chief Complaint:      Chief Complaint       Patient presents with        ?  Prostate Cancer           HPI: Colton Cox  72 y.o. MALE who returns for follow-up of his prostate cancer. Patient is doing well today.  Continues to have good results from ProTouch procedure, specifically with a good urinary stream. No longer taking any GU meds. Still experiencing hot flashes but states they are much less frequent. Does note some trouble concentrating. No gross hematuria  or dysuria. No other concerns today. States that he is traveling frequently for work all over the country.       Previous Hjstory   Patient underwent prostate biopsy on 12/12/15, which revealed GS 4+3 intermediate risk CaP in 9/12 cores. Bone scan on 12/22/15 negative for osseous metastatic disease. He opted for radiation + ADT. He is s/p ProTouch laser enucleation of the prostate with  tissue morcellation on 01/14/2016. ADT initiated on 02/06/16 with Eligard 45 mg injection.   Completed XRT 03/18/16-04/23/16.         Past Medical History:        Diagnosis  Date         ?  Cerebral artery occlusion with cerebral infarction [facility])       ?  Colon polyps           ?  Hypertension               Past Surgical History:         Procedure  Laterality  Date          ?  APPENDECTOMY         ?  HX COLONOSCOPY    01/2018     ?  HX HERNIA REPAIR         ?  HX OTHER SURGICAL    06/2016          Cat Scan: spot noted on lung          ?  HX UROLOGICAL  N/A  01/14/2016          Cysto, Right RPG, and Protouch laser enucleation of the prostate with tissue morcellation - Dr. Claiborne Billings             Social History          Tobacco Use         ?   Smoking status:  Former Smoker     ?  Smokeless tobacco:  Former Systems developer              Quit date:  09/20/1988       Substance Use Topics         ?  Alcohol use:  Yes         ?  Drug use:  No             Allergies        Allergen  Reactions         ?  Bee Sting  [Sting, Bee]  Shortness of Breath             Family History         Problem  Relation  Age of Onset          ?  Colon Cancer  Mother            ?  Colon Cancer  Father               Current Outpatient Medications          Medication  Sig  Dispense  Refill           ?  EPIPEN 2-PAK 0.3 mg/0.3 mL injection           ?  ibuprofen (MOTRIN) 800 mg tablet           ?  meloxicam (MOBIC) 15 mg tablet  Take 15 mg by mouth daily.         ?  clopidogrel (PLAVIX) 75 mg tab  Take  by mouth.         ?  calcium cit-vit D3-isoflavon 2 100-1,000-80 mg-unit-mg tab  Take  by mouth.         ?  cholecalciferol, vitamin D3, (VITAMIN D3) 2,000 unit tab  Take  by mouth.         ?  multivitamin (ONE A DAY) tablet  Take 1 Tab by mouth daily.         ?  aspirin delayed-release 81 mg tablet  81 mg.         ?  simvastatin (ZOCOR) 20 mg tablet                 ?  losartan-hydroCHLOROthiazide (HYZAAR) 50-12.5 mg per tablet  Take 1 Tab by Mouth Once a Day.               Review of Systems:   Constitutional: Fever: No   Skin: Rash: No   HEENT: Hearing difficulty: No   Eyes: Blurred vision: No   Cardiovascular: Chest pain: No   Respiratory: Shortness of breath: No   Gastrointestinal: Nausea/vomiting: No   Musculoskeletal: Back pain: No   Neurological: Weakness: No   Psychological: Memory loss: No   Comments/additional findings:    All other systems reviewed and are negative.      Physical Exam:   Visit Vitals      BP  132/70     Ht  5\' 11"  (1.803 m)     Wt  238 lb (108 kg)        BMI  33.19 kg/m??        Constitutional: WDWN, Pleasant and appropriate affect, No acute distress .     Respiratory: No respiratory distress or difficulties   Skin: No jaundice.     Neuro/Psych:  Alert and o riented. Affect  appropriate.    .     Imaging:   NM Bone Scan [facility] Body 12/22/2015   IMPRESSION:  No bone scan evidence of osseous metastatic disease.       Surgical Pathology 01/14/2016   FINAL DIAGNOSIS:    PROSTATE, TRANSURETHRAL RESECTION:    BENIGN NODULAR HYPERPLASIA      Prostate Biopsy Pathology:       result  Gleason Grade  cores          12/12/2015  4+3= 7  POS:9  Total:12        Biopsy Pathology 12/12/2015   DIAGNOSIS:   A. Right Apex Needle biopsy ??   ?? ?? ?? ?? ?? ?? ??Benign prostatic tissue.   B. Right Mid Needle biopsy ??   ?? ?? ??  ?? ?? ?? ??Benign prostatic tissue.   C. Right Base Needle biopsy ??   ?? ?? ?? ?? ?? ?? ??ADENOCARCINOMA, GLEASON SCORE 3 + 3 = 6 involving 5 % of the specimen (1 of 1 core(s) positive).   ?? ?? ?? ?? ??  ?? ??Grade Group 1. Ends not involved by the tumor. Perineural invasion.   D. Right Lateral Apex Needle biopsy ??   ?? ?? ?? ?? ?? ?? ??Benign prostatic tissue.   E. Right Lateral Mid Needle biopsy ??   ?? ??  ?? ?? ?? ?? ??  ADENOCARCINOMA, GLEASON SCORE 3 + 3 = 6 involving 10 % of the specimen (1 of 1 core(s) positive).   ?? ?? ?? ?? ?? ?? ??Grade Group 1. Ends not involved by the tumor.   F.??Right Lateral Base Needle biopsy  ??   ?? ?? ?? ?? ?? ?? ??ADENOCARCINOMA, GLEASON SCORE 3 + 3 = 6 involving 5 % of the specimen (1 of 1 core(s) positive).   ?? ?? ?? ?? ?? ?? ??Grade Group 1. Ends not involved by the tumor.   G. Left Apex Needle  biopsy ??   ?? ?? ?? ?? ?? ?? ??ADENOCARCINOMA, GLEASON SCORE 4 + 3 = 7 involving 20 % of the specimen, discontinuous (1 of 1 core(s) positive).   ?? ?? ?? ?? ?? ?? ??Gleason 4 comprises 70 % of the cancer. Grade  Group 3. Ends not involved by the tumor.   H. Left Mid Needle biopsy ??   ?? ?? ?? ?? ?? ?? ??ADENOCARCINOMA, GLEASON SCORE 3 + 4 = 7 involving 60 % of the specimen, discontinuous (1 of 1 core(s) positive).   ?? ?? ??  ?? ?? ?? ??Gleason 4 comprises 20 % of the cancer. Grade Group 2. Tumor involves one end.   I. Left Base Needle biopsy ??   ?? ?? ?? ?? ?? ?? ??ADENOCARCINOMA, GLEASON SCORE 4 + 3 = 7 involving 80 % of the specimen, discontinuous  (1 of 1  core(s) positive).   ?? ?? ?? ?? ?? ?? ??Gleason 4 comprises 70 % of the cancer. Grade Group 3. Tumor involves one end. Perineural invasion.   J. Left Lateral Apex Needle biopsy ??   ?? ?? ?? ?? ?? ?? ??ADENOCARCINOMA,  GLEASON SCORE 3 + 4 = 7 involving 20 % of the specimen, discontinuous (1 of 1 core(s) positive).   ?? ?? ?? ?? ?? ?? ??Gleason 4 comprises 40 % of the cancer. Grade Group 2. Ends not involved by the tumor.   K. Left Lateral Mid Needle  biopsy ??   ?? ?? ?? ?? ?? ?? ??ADENOCARCINOMA, GLEASON SCORE 4 + 3 = 7 involving 60 % of the specimen (3 of 3 core(s) positive).   ?? ?? ?? ?? ?? ?? ??Gleason 4 comprises 70 % of the cancer. Grade Group 3. Tumor  involves one end. Perineural invasion.   L. Left Lateral Base Needle biopsy ??   ?? ?? ?? ?? ?? ?? ??ADENOCARCINOMA, GLEASON SCORE 4 + 3 = 7 involving 95 % of the specimen (1 of 1 core(s) positive).   ?? ?? ?? ?? ??  ?? ??Gleason 4 comprises 70 % of the cancer. Grade Group 3. Ends not involved by the tumor. Perineural invasion.       Labs reviewed today:      Recent Results (from the past 24 hour(s))     PROSTATE SPECIFIC ANTIGEN, TOTAL (PSA)          Collection Time: 03/14/18 10:16 AM         Result  Value  Ref Range            Prostate Specific Ag  0.11  0.00 - 4.00 ng/mL       TESTOSTERONE          Collection Time: 03/14/18 10:16 AM         Result  Value  Ref Range            Testosterone  184 (L)  348 - 1,197 ng/dL  AMB POC URINALYSIS DIP STICK AUTO W/O MICRO          Collection Time: 03/14/18 11:18 AM         Result  Value  Ref Range            Color (UA POC)           Clarity (UA POC)           Glucose (UA POC)  Negative  Negative       Bilirubin (UA POC)  Negative  Negative       Ketones (UA POC)  Negative  Negative       Specific gravity (UA POC)  1.020  1.001 - 1.035       Blood (UA POC)  Trace  Negative       pH (UA POC)  5.0  4.6 - 8.0       Protein (UA POC)  Negative  Negative       Urobilinogen (UA POC)  0.2 mg/dL  0.2 - 1       Nitrites (UA POC)  Negative  Negative            Leukocyte  esterase (UA POC)  Negative  Negative            PSA /TESTOSTERONE - B[facility]SI  PSA  Testosterone, total     Latest Ref Rng & Units  0 - 4 ng/mL  348 - 1197 ng/dL         03/14/2018  0.11  184     10/05/2017  0.08  131     04/26/2017  0.04  51     01/25/2017  <0.03           10/26/2016  <0.03  <3 (L)     08/03/2016  <0.03  <3 (L)         11/03/2015  3.75           08/07/2015  5.40 (H)             Teodora Medici PA-C   [facility]       CC:   Paul Dykes, PA-C       Patient's BMI is out of the normal parameters.  Information about BMI was given and patient was advised to follow-up with their PCP for further management.      Medical documentation entered into the chart by Nicky Pugh, medical scribe for Teodora Medici, PA on  03/14/2018.

## 2018-03-14 NOTE — Progress Notes (Signed)
Colton Cox  DOB 04/16/1946  72 y.o.    PROSTATE CANCER ESTABLISHED DIAGNOSIS    Encounter Diagnoses     ICD-10-CM ICD-9-CM   1. Prostate cancer ([place]) C61 185   2. Vitamin D deficiency E55.9 268.9   3. History of hematuria Z87.448 V13.09     Assessment:  1. 72 y.o. male with cT1cNxM0 GS 4+3, Prostate cancer in 9/12 cores, prostate volume of  49.57 cm3, Pre-biopsy PSA= 3.75ng/mL.     Bone scan on 12/22/15 negative for metastatic prostate cancer. CTU 2/17 negative   ADT initiated on 02/06/16 with Eligard 45 mg injection.  Single dose   Completed XRT 03/18/16-04/23/16 with Dr. Tobe Sos    Most recent PSA: 0.11 ng/mL on 03/14/2018 with T no longer castrate and increasing (184)    2. 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 No urinary complaints.      3. History of microscopic hematuria, 1+ blood and 5-10 RBC by UA on 11/11/15.   Urine cytology 10/2015 showed rare atypical urothelial cells.    CT Urogram 11/21/15 showed slightly attenuated and perhaps slightly thickened mid-distal right ureter/equivocally strictured, but without proximal dilatation. Rretrograde ureterography in OR 4/26 w/o evidence of filling defect   No gross hematuria    4. 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 Vit D today pending    4. History of TIA on Plavix 75 mg and aspirin 81 mg daily  5. History of tobacco use    Plan:  PSA and testosterone reviewed with patient- 0.11 ng/ml and 184, respectively  Advised patient that slight PSA increase with T increase is not unexpected and recommend continued close monitor to assess for stability  Vit D today pending- will forward patient results  Continue 1000 units of Calcium and 2000 units of Vit-D.  Nurse visit in 3 months for PSA, T per patient request due to work commitments  Follow up in 6 months with PSA, T    I am following the established plan for Dr. Claiborne Billings and Dr. Claiborne Billings is the supervising physician for this day.    Chief Complaint:    Chief Complaint   Patient presents with   ??? Prostate Cancer       HPI: Colton Cox 72 y.o. MALE who returns for follow-up of his prostate cancer. Patient is doing well today.     Continues to have good results from ProTouch procedure, specifically with a good urinary stream. No longer taking any GU meds. Still experiencing hot flashes but states they are much less frequent. Does note some trouble concentrating. No gross hematuria or dysuria. No other concerns today. States that he is traveling frequently for work all over the country.     Previous Hjstory  Patient underwent prostate biopsy on 12/12/15, which revealed GS 4+3 intermediate risk CaP in 9/12 cores. Bone scan on 12/22/15 negative for osseous metastatic disease. He opted for radiation + ADT. He is s/p ProTouch laser enucleation of the prostate with tissue morcellation on 01/14/2016. ADT initiated on 02/06/16 with Eligard 45 mg injection.  Completed XRT 03/18/16-04/23/16.     Past Medical History:   Diagnosis Date   ??? Cerebral artery occlusion with cerebral infarction [facility])    ??? Colon polyps    ??? Hypertension        Past Surgical History:   Procedure Laterality Date   ??? APPENDECTOMY     ??? HX COLONOSCOPY  01/2018   ???  HX HERNIA REPAIR     ??? HX OTHER SURGICAL  06/2016    Cat Scan: spot noted on lung   ??? HX UROLOGICAL N/A 01/14/2016    Cysto, Right RPG, and Protouch laser enucleation of the prostate with tissue morcellation - Dr. Claiborne Billings       Social History     Tobacco Use   ??? Smoking status: Former Smoker   ??? Smokeless tobacco: Former Systems developer     Quit date: 09/20/1988   Substance Use Topics   ??? Alcohol use: Yes   ??? Drug use: No       Allergies   Allergen Reactions   ??? Bee Sting  [Sting, Bee] Shortness of Breath       Family History   Problem Relation Age of Onset   ??? Colon Cancer Mother    ??? Colon Cancer Father        Current Outpatient Medications   Medication Sig Dispense Refill   ??? EPIPEN 2-PAK 0.3 mg/0.3 mL injection      ??? ibuprofen (MOTRIN) 800 mg tablet       ??? meloxicam (MOBIC) 15 mg tablet Take 15 mg by mouth daily.     ??? clopidogrel (PLAVIX) 75 mg tab Take  by mouth.     ??? calcium cit-vit D3-isoflavon 2 100-1,000-80 mg-unit-mg tab Take  by mouth.     ??? cholecalciferol, vitamin D3, (VITAMIN D3) 2,000 unit tab Take  by mouth.     ??? multivitamin (ONE A DAY) tablet Take 1 Tab by mouth daily.     ??? aspirin delayed-release 81 mg tablet 81 mg.     ??? simvastatin (ZOCOR) 20 mg tablet      ??? losartan-hydroCHLOROthiazide (HYZAAR) 50-12.5 mg per tablet Take 1 Tab by Mouth Once a Day.         Review of Systems:  Constitutional: Fever: No  Skin: Rash: No  HEENT: Hearing difficulty: No  Eyes: Blurred vision: No  Cardiovascular: Chest pain: No  Respiratory: Shortness of breath: No  Gastrointestinal: Nausea/vomiting: No  Musculoskeletal: Back pain: No  Neurological: Weakness: No  Psychological: Memory loss: No  Comments/additional findings:   All other systems reviewed and are negative.    Physical Exam:  Visit Vitals  BP 132/70   Ht 5\' 11"  (1.803 m)   Wt 238 lb (108 kg)   BMI 33.19 kg/m??     Constitutional: WDWN, Pleasant and appropriate affect, No acute distress.    Respiratory: No respiratory distress or difficulties  Skin: No jaundice.    Neuro/Psych:  Alert and oriented. Affect appropriate.   .    Imaging:  NM Bone Scan [facility] Body 12/22/2015  IMPRESSION:  No bone scan evidence of osseous metastatic disease.     Surgical Pathology 01/14/2016  FINAL DIAGNOSIS:   PROSTATE, TRANSURETHRAL RESECTION:   BENIGN NODULAR HYPERPLASIA    Prostate Biopsy Pathology:  result Gleason Grade cores   12/12/2015 4+3= 7 POS:9 Total:12     Biopsy Pathology 12/12/2015  DIAGNOSIS:   A. Right Apex Needle biopsy ??   ?? ?? ?? ?? ?? ?? ??Benign prostatic tissue.   B. Right Mid Needle biopsy ??   ?? ?? ?? ?? ?? ?? ??Benign prostatic tissue.   C. Right Base Needle biopsy ??   ?? ?? ?? ?? ?? ?? ??ADENOCARCINOMA, GLEASON SCORE 3 + 3 = 6 involving 5 % of the specimen (1 of 1 core(s) positive).    ?? ?? ?? ?? ?? ?? ??Grade  Group 1. Ends not involved by the tumor. Perineural invasion.   D. Right Lateral Apex Needle biopsy ??   ?? ?? ?? ?? ?? ?? ??Benign prostatic tissue.   E. Right Lateral Mid Needle biopsy ??   ?? ?? ?? ?? ?? ?? ??ADENOCARCINOMA, GLEASON SCORE 3 + 3 = 6 involving 10 % of the specimen (1 of 1 core(s) positive).   ?? ?? ?? ?? ?? ?? ??Grade Group 1. Ends not involved by the tumor.   F.??Right Lateral Base Needle biopsy ??   ?? ?? ?? ?? ?? ?? ??ADENOCARCINOMA, GLEASON SCORE 3 + 3 = 6 involving 5 % of the specimen (1 of 1 core(s) positive).   ?? ?? ?? ?? ?? ?? ??Grade Group 1. Ends not involved by the tumor.   G. Left Apex Needle biopsy ??   ?? ?? ?? ?? ?? ?? ??ADENOCARCINOMA, GLEASON SCORE 4 + 3 = 7 involving 20 % of the specimen, discontinuous (1 of 1 core(s) positive).   ?? ?? ?? ?? ?? ?? ??Gleason 4 comprises 70 % of the cancer. Grade Group 3. Ends not involved by the tumor.   H. Left Mid Needle biopsy ??   ?? ?? ?? ?? ?? ?? ??ADENOCARCINOMA, GLEASON SCORE 3 + 4 = 7 involving 60 % of the specimen, discontinuous (1 of 1 core(s) positive).   ?? ?? ?? ?? ?? ?? ??Gleason 4 comprises 20 % of the cancer. Grade Group 2. Tumor involves one end.   I. Left Base Needle biopsy ??   ?? ?? ?? ?? ?? ?? ??ADENOCARCINOMA, GLEASON SCORE 4 + 3 = 7 involving 80 % of the specimen, discontinuous (1 of 1 core(s) positive).   ?? ?? ?? ?? ?? ?? ??Gleason 4 comprises 70 % of the cancer. Grade Group 3. Tumor involves one end. Perineural invasion.   J. Left Lateral Apex Needle biopsy ??   ?? ?? ?? ?? ?? ?? ??ADENOCARCINOMA, GLEASON SCORE 3 + 4 = 7 involving 20 % of the specimen, discontinuous (1 of 1 core(s) positive).   ?? ?? ?? ?? ?? ?? ??Gleason 4 comprises 40 % of the cancer. Grade Group 2. Ends not involved by the tumor.   K. Left Lateral Mid Needle biopsy ??   ?? ?? ?? ?? ?? ?? ??ADENOCARCINOMA, GLEASON SCORE 4 + 3 = 7 involving 60 % of the specimen (3 of 3 core(s) positive).   ?? ?? ?? ?? ?? ?? ??Gleason 4 comprises 70 % of the cancer. Grade Group 3. Tumor involves one end. Perineural invasion.    L. Left Lateral Base Needle biopsy ??   ?? ?? ?? ?? ?? ?? ??ADENOCARCINOMA, GLEASON SCORE 4 + 3 = 7 involving 95 % of the specimen (1 of 1 core(s) positive).   ?? ?? ?? ?? ?? ?? ??Gleason 4 comprises 70 % of the cancer. Grade Group 3. Ends not involved by the tumor. Perineural invasion.     Labs reviewed today:   Recent Results (from the past 24 hour(s))   PROSTATE SPECIFIC ANTIGEN, TOTAL (PSA)    Collection Time: 03/14/18 10:16 AM   Result Value Ref Range    Prostate Specific Ag 0.11 0.00 - 4.00 ng/mL   TESTOSTERONE    Collection Time: 03/14/18 10:16 AM   Result Value Ref Range    Testosterone 184 (L) 348 - 1,197 ng/dL   AMB POC URINALYSIS DIP STICK AUTO W/O MICRO    Collection Time: 03/14/18 11:18 AM   Result Value  Ref Range    Color (UA POC)      Clarity (UA POC)      Glucose (UA POC) Negative Negative    Bilirubin (UA POC) Negative Negative    Ketones (UA POC) Negative Negative    Specific gravity (UA POC) 1.020 1.001 - 1.035    Blood (UA POC) Trace Negative    pH (UA POC) 5.0 4.6 - 8.0    Protein (UA POC) Negative Negative    Urobilinogen (UA POC) 0.2 mg/dL 0.2 - 1    Nitrites (UA POC) Negative Negative    Leukocyte esterase (UA POC) Negative Negative     PSA /TESTOSTERONE - [facility] PSA Testosterone, total   Latest Ref Rng & Units 0 - 4 ng/mL 348 - 1197 ng/dL   03/14/2018 0.11 184   10/05/2017 0.08 131   04/26/2017 0.04 51   01/25/2017 <0.03    10/26/2016 <0.03 <3 (L)   08/03/2016 <0.03 <3 (L)   11/03/2015 3.75    08/07/2015 5.40 (H)        Teodora Medici PA-C  [facility]     CC:  Paul Dykes, PA-C     Patient's BMI is out of the normal parameters.  Information about BMI was given and patient was advised to follow-up with their PCP for further management.    Medical documentation entered into the chart by Nicky Pugh, medical scribe for Teodora Medici, PA on 03/14/2018.

## 2018-03-14 NOTE — Progress Notes (Signed)
Colton Cox presents today for lab draw per Dr. Evette Georges order.     PSA, TST and Vitamin D obtained via venipuncture without any difficulty.    Patient will be notified with lab results.     Orders Placed This Encounter   ??? PROSTATE SPECIFIC ANTIGEN, TOTAL (PSA)   ??? TESTOSTERONE   ??? VITAMIN D, 25 HYDROXY   ??? COLLECTION VENOUS BLOOD,VENIPUNCTURE       Colton Cox

## 2018-03-17 LAB — VITAMIN D, 25 HYDROXY
VITAMIN D, 25-HYDROXY: 18.6 ng/ml — CL (ref 30.0–100.0)
Vit D, 25-Hydroxy: 18.6 ng/ml — CL (ref 30.0–100.0)

## 2018-03-20 MED ORDER — ERGOCALCIFEROL (VITAMIN D2) 50,000 UNIT CAP
1250 mcg (50,000 unit) | ORAL_CAPSULE | ORAL | 0 refills | Status: DC
Start: 2018-03-20 — End: 2021-10-12

## 2018-06-12 ENCOUNTER — Encounter: Primary: Family

## 2018-07-20 ENCOUNTER — Institutional Professional Consult (permissible substitution): Admit: 2018-07-20 | Discharge: 2018-07-20 | Primary: Family

## 2018-07-20 DIAGNOSIS — C61 Malignant neoplasm of prostate: Secondary | ICD-10-CM

## 2018-07-20 LAB — TESTOSTERONE
Testosterone: 215 ng/dL — ABNORMAL LOW (ref 348–1197)
Testosterone: 215 ng/dL — ABNORMAL LOW (ref 348–1197)

## 2018-07-20 LAB — PROSTATE SPECIFIC ANTIGEN, TOTAL (PSA)
PSA: 0.11 ng/mL (ref 0.00–4.00)
Prostate Specific Ag: 0.11 ng/mL (ref 0.00–4.00)

## 2018-07-20 NOTE — Progress Notes (Signed)
 Colton Cox presents today for lab draw per Dr. Joesphine order.   Dr. Malachy was present in the clinic as incident to.     PSA and TST obtained via venipuncture without any difficulty.    Patient will be notified with lab results.       Orders Placed This Encounter   . PROSTATE SPECIFIC ANTIGEN, TOTAL (PSA)   . TESTOSTERONE   . COLLECTION VENOUS BLOOD,VENIPUNCTURE       Colton Cox

## 2018-07-20 NOTE — Progress Notes (Signed)
Noted. Forwarded to patient.

## 2018-07-20 NOTE — Progress Notes (Signed)
Colton Cox presents today for lab draw per Dr. Evette Georges order.   Dr. Belenda Cruise was present in the clinic as incident to.     PSA and TST obtained via venipuncture without any difficulty.    Patient will be notified with lab results.       Orders Placed This Encounter   ??? PROSTATE SPECIFIC ANTIGEN, TOTAL (PSA)   ??? TESTOSTERONE   ??? COLLECTION VENOUS BLOOD,VENIPUNCTURE       Colton Cox

## 2018-09-26 ENCOUNTER — Encounter: Attending: Urology | Primary: Medical

## 2018-09-26 ENCOUNTER — Ambulatory Visit: Admit: 2018-09-26 | Discharge: 2018-09-26 | Payer: MEDICARE | Attending: Urology | Primary: Medical

## 2018-09-26 ENCOUNTER — Ambulatory Visit: Attending: Urology | Primary: Medical

## 2018-09-26 DIAGNOSIS — C61 Malignant neoplasm of prostate: Secondary | ICD-10-CM

## 2018-09-26 LAB — AMB POC URINALYSIS DIP STICK AUTO W/O MICRO
Bilirubin (UA POC): NEGATIVE
Bilirubin, Urine, POC: NEGATIVE
Glucose (UA POC): NEGATIVE
Glucose, Urine, POC: NEGATIVE
Ketones (UA POC): NEGATIVE
Ketones, Urine, POC: NEGATIVE
Leukocyte Esterase, Urine, POC: NEGATIVE
Leukocyte esterase (UA POC): NEGATIVE
Nitrite, Urine, POC: NEGATIVE
Nitrites (UA POC): NEGATIVE
Protein (UA POC): NEGATIVE
Protein, Urine, POC: NEGATIVE
Specific Gravity, Urine, POC: 1.02 NA (ref 1.001–1.035)
Specific gravity (UA POC): 1.02 (ref 1.001–1.035)
Urobilinogen (UA POC): 0.2 (ref 0.2–1)
Urobilinogen, POC: 0.2 (ref 0.2–1)
pH (UA POC): 5 (ref 4.6–8.0)
pH, Urine, POC: 5 NA (ref 4.6–8.0)

## 2018-09-26 LAB — PROSTATE SPECIFIC ANTIGEN, TOTAL (PSA)
PSA: 0.25 ng/mL (ref 0.00–4.00)
Prostate Specific Ag: 0.25 ng/mL (ref 0.00–4.00)

## 2018-09-26 LAB — TESTOSTERONE
Testosterone: 187 ng/dL — ABNORMAL LOW (ref 348–1197)
Testosterone: 187 ng/dL — ABNORMAL LOW (ref 348–1197)

## 2018-09-26 LAB — AMB POC PVR, MEAS,POST-VOID RES,US,NON-IMAGING
PVR POC: 0 cc
PVR: 0 cc

## 2018-09-26 NOTE — Progress Notes (Signed)
Colton Cox presents today for lab draw per Dr. Landry Dyke order.     PSA and TST obtained via venipuncture without any difficulty.    Patient will be notified with lab results.     Orders Placed This Encounter   . PROSTATE SPECIFIC ANTIGEN, TOTAL (PSA)   . TESTOSTERONE   . COLLECTION VENOUS BLOOD,VENIPUNCTURE     Pt late for check in. Same Day PSA and TST sent for processing.    [name]

## 2018-09-26 NOTE — Progress Notes (Signed)
Progress Notes by Kathreen Cornfield, MD at 09/26/18 1030                Author: Kathreen Cornfield, MD  Service: --  Author Type: Physician       Filed: 09/26/18 1105  Encounter Date: 09/26/2018  Status: Signed          Editor: Kathreen Cornfield, MD (Physician)                       Colton Cox   DOB [birth year]   73 y.o.      PROSTATE CANCER ESTABLISHED DIAGNOSIS        Encounter Diagnoses              ICD-10-CM  ICD-9-CM          1.  Prostate cancer ([place])  C61  185     2.  Nocturia  R35.1  788.43     3.  Benign localized prostatic hyperplasia with lower urinary tract symptoms (LUTS)  N40.1  600.21             599.69        Assessment:   1. 73 y.o. male with cT1cNxM0 GS 4+3, Prostate cancer in 9/12 cores, prostate volume of   49.57 cm3, Pre-biopsy PSA= 3.75ng/mL.      Bone scan on 12/22/15 negative for metastatic prostate cancer. CTU 2/17 negative    ADT initiated on 02/06/16 with Eligard 45 mg injection.  Single dose    Completed XRT 03/18/16-04/23/16 with Dr. Tobe Sos     Most recent PSA: 0.25 ng/mL (T level 187) on 09/26/2018, previously 0.11 ng/mL on 07/20/2018.      2. 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 Mild worsening of symptoms with c/o weak stream. Not interested in GU meds.        3. History of microscopic hematuria, 1+ blood and 5-10 RBC by UA on 11/11/15.    Urine cytology 10/2015 showed rare atypical urothelial cells.     CT Urogram 11/21/15 showed slightly attenuated and perhaps slightly thickened mid-distal right ureter/equivocally strictured, but without proximal dilatation. Rretrograde ureterography in OR 4/26 w/o evidence of filling defect    No gross hematuria      4. 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 Vit D today pending      4. History of TIA on Plavix 75 mg and aspirin 81 mg daily   5. History of tobacco use      Plan:   Reviewed labs drawn today - PSA increased to 0.25 ng/mL and T level 187.   Offered GU medication for mild LUTS, patient  not interested at this time   PVR reviewed, 0   Tech visit in 3 months with PSA    Follow up in 6 months with same day PSA, T         Chief Complaint:      Chief Complaint       Patient presents with        ?  Prostate Cancer           HPI: Colton Cox  73 y.o. MALE who returns for follow-up of his prostate cancer cT1cNxM0 GS 4+3, Prostate cancer in 9/12 cores, prostate volume of  49.57 cm3, Pre-biopsy PSA= 3.75ng/mL s/p ADT and XRT completed 04/2016  and BPH s/p ProTouch 12/2015.      Patient reports  his urinary symptoms seem to be "slowing down a little" since his appt last year, specifically stream is weaker. Symptoms not bothersome enough to start a new medication. PVR today 0cc. Denies leakage but states he has occasional "spurts"  of urine. Denies gross hematuria. Denies bone or back pain. Denies daytime hot flashes but occasionally has a hot flash at night but states it might be related to the temperature in his house.      Previous Hjstory   Patient underwent prostate biopsy on 12/12/15, which revealed GS 4+3 intermediate risk CaP in 9/12 cores. Bone scan on 12/22/15 negative for osseous metastatic disease. He opted for radiation + ADT. He is s/p ProTouch laser enucleation of the prostate with  tissue morcellation on 01/14/2016. ADT initiated on 02/06/16 with Eligard 45 mg injection.   Completed XRT 03/18/16-04/23/16.         Past Medical History:        Diagnosis  Date         ?  Cerebral artery occlusion with cerebral infarction [facility])       ?  Colon polyps           ?  Hypertension               Past Surgical History:         Procedure  Laterality  Date          ?  APPENDECTOMY         ?  HX COLONOSCOPY    01/2018     ?  HX HERNIA REPAIR         ?  HX OTHER SURGICAL    06/2016          Cat Scan: spot noted on lung          ?  HX UROLOGICAL  N/A  01/14/2016          Cysto, Right RPG, and Protouch laser enucleation of the prostate with tissue morcellation - Dr. Claiborne Billings             Social History          Tobacco Use          ?  Smoking status:  Former Smoker     ?  Smokeless tobacco:  Former Systems developer              Quit date:  09/20/1988       Substance Use Topics         ?  Alcohol use:  Yes         ?  Drug use:  No             Allergies        Allergen  Reactions         ?  Bee Sting  [Sting, Bee]  Shortness of Breath             Family History         Problem  Relation  Age of Onset          ?  Colon Cancer  Mother            ?  Colon Cancer  Father               Current Outpatient Medications          Medication  Sig  Dispense  Refill           ?  ergocalciferol (  ERGOCALCIFEROL) 50,000 unit capsule  Take 1 Cap by mouth every seven (7) days.  8 Cap  0     ?  EPIPEN 2-PAK 0.3 mg/0.3 mL injection           ?  ibuprofen (MOTRIN) 800 mg tablet           ?  meloxicam (MOBIC) 15 mg tablet  Take 15 mg by mouth daily.         ?  clopidogrel (PLAVIX) 75 mg tab  Take  by mouth.         ?  calcium cit-vit D3-isoflavon 2 100-1,000-80 mg-unit-mg tab  Take  by mouth.         ?  cholecalciferol, vitamin D3, (VITAMIN D3) 2,000 unit tab  Take  by mouth.         ?  multivitamin (ONE A DAY) tablet  Take 1 Tab by mouth daily.         ?  aspirin delayed-release 81 mg tablet  81 mg.         ?  simvastatin (ZOCOR) 20 mg tablet                 ?  losartan-hydroCHLOROthiazide (HYZAAR) 50-12.5 mg per tablet  Take 1 Tab by Mouth Once a Day.               Review of Systems:   Constitutional: Fever: No   Skin: Rash: No   HEENT: Hearing difficulty: No   Eyes: Blurred vision: No   Cardiovascular: Chest pain: No   Respiratory: Shortness of breath: No   Gastrointestinal: Nausea/vomiting: No   Musculoskeletal: Back pain: No   Neurological: Weakness: No   Psychological: Memory loss: No   Comments/additional findings:    All other systems reviewed and are negative.      Physical Exam:   Visit Vitals      BP  136/82     Ht  5\' 11"  (1.803 m)     Wt  235 lb (106.6 kg)        BMI  32.78 kg/m??        Constitutional: WDWN, Pleasant and appropriate affect, No acute distress .      Respiratory: No respiratory distress or difficulties   Skin: No jaundice.     Neuro/Psych:  Alert and o riented. Affect appropriate.    GU DRE 09/26/2018: prostate flat no signs of recurrence       Imaging:   NM Bone Scan [facility] Body 12/22/2015   IMPRESSION:  No bone scan evidence of osseous metastatic disease.       Surgical Pathology 01/14/2016   FINAL DIAGNOSIS:    PROSTATE, TRANSURETHRAL RESECTION:    BENIGN NODULAR HYPERPLASIA      Prostate Biopsy Pathology:       result  Gleason Grade  cores          12/12/2015  4+3= 7  POS:9  Total:12        Biopsy Pathology 12/12/2015   DIAGNOSIS:   A. Right Apex Needle biopsy ??   ?? ?? ?? ?? ?? ?? ??Benign prostatic tissue.   B. Right Mid Needle biopsy ??   ?? ?? ??  ?? ?? ?? ??Benign prostatic tissue.   C. Right Base Needle biopsy ??   ?? ?? ?? ?? ?? ?? ??ADENOCARCINOMA, GLEASON SCORE 3 + 3 = 6 involving 5 % of the specimen (1 of 1 core(s) positive).   ?? ?? ?? ?? ??  ?? ??  Grade Group 1. Ends not involved by the tumor. Perineural invasion.   D. Right Lateral Apex Needle biopsy ??   ?? ?? ?? ?? ?? ?? ??Benign prostatic tissue.   E. Right Lateral Mid Needle biopsy ??   ?? ??  ?? ?? ?? ?? ??ADENOCARCINOMA, GLEASON SCORE 3 + 3 = 6 involving 10 % of the specimen (1 of 1 core(s) positive).   ?? ?? ?? ?? ?? ?? ??Grade Group 1. Ends not involved by the tumor.   F.??Right Lateral Base Needle biopsy  ??   ?? ?? ?? ?? ?? ?? ??ADENOCARCINOMA, GLEASON SCORE 3 + 3 = 6 involving 5 % of the specimen (1 of 1 core(s) positive).   ?? ?? ?? ?? ?? ?? ??Grade Group 1. Ends not involved by the tumor.   G. Left Apex Needle  biopsy ??   ?? ?? ?? ?? ?? ?? ??ADENOCARCINOMA, GLEASON SCORE 4 + 3 = 7 involving 20 % of the specimen, discontinuous (1 of 1 core(s) positive).   ?? ?? ?? ?? ?? ?? ??Gleason 4 comprises 70 % of the cancer. Grade  Group 3. Ends not involved by the tumor.   H. Left Mid Needle biopsy ??   ?? ?? ?? ?? ?? ?? ??ADENOCARCINOMA, GLEASON SCORE 3 + 4 = 7 involving 60 % of the specimen, discontinuous (1 of 1 core(s) positive).   ?? ?? ??  ?? ?? ?? ??Gleason 4 comprises 20 % of the cancer.  Grade Group 2. Tumor involves one end.   I. Left Base Needle biopsy ??   ?? ?? ?? ?? ?? ?? ??ADENOCARCINOMA, GLEASON SCORE 4 + 3 = 7 involving 80 % of the specimen, discontinuous  (1 of 1 core(s) positive).   ?? ?? ?? ?? ?? ?? ??Gleason 4 comprises 70 % of the cancer. Grade Group 3. Tumor involves one end. Perineural invasion.   J. Left Lateral Apex Needle biopsy ??   ?? ?? ?? ?? ?? ?? ??ADENOCARCINOMA,  GLEASON SCORE 3 + 4 = 7 involving 20 % of the specimen, discontinuous (1 of 1 core(s) positive).   ?? ?? ?? ?? ?? ?? ??Gleason 4 comprises 40 % of the cancer. Grade Group 2. Ends not involved by the tumor.   K. Left Lateral Mid Needle  biopsy ??   ?? ?? ?? ?? ?? ?? ??ADENOCARCINOMA, GLEASON SCORE 4 + 3 = 7 involving 60 % of the specimen (3 of 3 core(s) positive).   ?? ?? ?? ?? ?? ?? ??Gleason 4 comprises 70 % of the cancer. Grade Group 3. Tumor  involves one end. Perineural invasion.   L. Left Lateral Base Needle biopsy ??   ?? ?? ?? ?? ?? ?? ??ADENOCARCINOMA, GLEASON SCORE 4 + 3 = 7 involving 95 % of the specimen (1 of 1 core(s) positive).   ?? ?? ?? ?? ??  ?? ??Gleason 4 comprises 70 % of the cancer. Grade Group 3. Ends not involved by the tumor. Perineural invasion.       Labs reviewed today:      Recent Results (from the past 24 hour(s))     PROSTATE SPECIFIC ANTIGEN, TOTAL (PSA)          Collection Time: 09/26/18  9:57 AM         Result  Value  Ref Range            Prostate Specific Ag  0.25  0.00 - 4.00 ng/mL  TESTOSTERONE          Collection Time: 09/26/18  9:57 AM         Result  Value  Ref Range            Testosterone  187 (L)  348 - 1,197 ng/dL       AMB POC URINALYSIS DIP STICK AUTO W/O MICRO          Collection Time: 09/26/18 10:45 AM         Result  Value  Ref Range            Color (UA POC)           Clarity (UA POC)           Glucose (UA POC)  Negative  Negative       Bilirubin (UA POC)  Negative  Negative       Ketones (UA POC)  Negative  Negative       Specific gravity (UA POC)  1.020  1.001 - 1.035       Blood (UA POC)  Trace  Negative       pH (UA  POC)  5.0  4.6 - 8.0       Protein (UA POC)  Negative  Negative       Urobilinogen (UA POC)  0.2 mg/dL  0.2 - 1       Nitrites (UA POC)  Negative  Negative       Leukocyte esterase (UA POC)  Negative  Negative       AMB POC PVR, MEAS,POST-VOID RES,US,NON-IMAGING          Collection Time: 09/26/18 10:46 AM         Result  Value  Ref Range            PVR  0  cc            PSA /TESTOSTERONE - [facility]  PSA  Testosterone, total     Latest Ref Rng & Units  0 - 4 ng/mL  348 - 1197 ng/dL         09/26/2018  0.25  187     07/20/2018  0.11  215     03/14/2018  0.11  184     10/05/2017  0.08  131     04/26/2017  0.04  51     01/25/2017  <0.03           10/26/2016  <0.03  <3 (L)     08/03/2016  <0.03  <3 (L)         11/03/2015  3.75           08/07/2015  5.40 (H)             Kathreen Cornfield, MD   [facility]   [place], [place]   Phone: [phone]   Pager: [phone]      CC:   Paul Dykes, PA-C       Patient's BMI is out of the normal parameters.  Information about BMI was given and patient was advised to follow-up with their PCP for further management.      Medical documentation entered into the chart by Nicky Pugh, medical scribe for Kathreen Cornfield, MD on  09/26/2018.

## 2018-09-26 NOTE — Progress Notes (Signed)
GRIFFEN FRAYNE  DOB 1946/08/11  73 y.o.    PROSTATE CANCER ESTABLISHED DIAGNOSIS    Encounter Diagnoses     ICD-10-CM ICD-9-CM   1. Prostate cancer ([place]) C61 185   2. Nocturia R35.1 788.43   3. Benign localized prostatic hyperplasia with lower urinary tract symptoms (LUTS) N40.1 600.21     599.69     Assessment:  1. 73 y.o. male with cT1cNxM0 GS 4+3, Prostate cancer in 9/12 cores, prostate volume of  49.57 cm3, Pre-biopsy PSA= 3.75ng/mL.     Bone scan on 12/22/15 negative for metastatic prostate cancer. CTU 2/17 negative   ADT initiated on 02/06/16 with Eligard 45 mg injection.  Single dose   Completed XRT 03/18/16-04/23/16 with Dr. Tobe Sos    Most recent PSA: 0.25 ng/mL (T level 187) on 09/26/2018, previously 0.11 ng/mL on 07/20/2018.    2. 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 Mild worsening of symptoms with c/o weak stream. Not interested in GU meds.      3. History of microscopic hematuria, 1+ blood and 5-10 RBC by UA on 11/11/15.   Urine cytology 10/2015 showed rare atypical urothelial cells.    CT Urogram 11/21/15 showed slightly attenuated and perhaps slightly thickened mid-distal right ureter/equivocally strictured, but without proximal dilatation. Rretrograde ureterography in OR 4/26 w/o evidence of filling defect   No gross hematuria    4. 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 Vit D today pending    4. History of TIA on Plavix 75 mg and aspirin 81 mg daily  5. History of tobacco use    Plan:  Reviewed labs drawn today - PSA increased to 0.25 ng/mL and T level 187.  Offered GU medication for mild LUTS, patient not interested at this time  PVR reviewed, 0  Tech visit in 3 months with PSA   Follow up in 6 months with same day PSA, T      Chief Complaint:   Chief Complaint   Patient presents with   ??? Prostate Cancer       HPI: MAYSIN CARSTENS 73 y.o. MALE who returns for follow-up of his prostate  cancer cT1cNxM0 GS 4+3, Prostate cancer in 9/12 cores, prostate volume of  49.57 cm3, Pre-biopsy PSA= 3.75ng/mL s/p ADT and XRT completed 04/2016 and BPH s/p ProTouch 12/2015.    Patient reports his urinary symptoms seem to be "slowing down a little" since his appt last year, specifically stream is weaker. Symptoms not bothersome enough to start a new medication. PVR today 0cc. Denies leakage but states he has occasional "spurts" of urine. Denies gross hematuria. Denies bone or back pain. Denies daytime hot flashes but occasionally has a hot flash at night but states it might be related to the temperature in his house.    Previous Hjstory  Patient underwent prostate biopsy on 12/12/15, which revealed GS 4+3 intermediate risk CaP in 9/12 cores. Bone scan on 12/22/15 negative for osseous metastatic disease. He opted for radiation + ADT. He is s/p ProTouch laser enucleation of the prostate with tissue morcellation on 01/14/2016. ADT initiated on 02/06/16 with Eligard 45 mg injection.  Completed XRT 03/18/16-04/23/16.     Past Medical History:   Diagnosis Date   ??? Cerebral artery occlusion with cerebral infarction [facility])    ??? Colon polyps    ??? Hypertension        Past Surgical History:   Procedure Laterality Date   ??? APPENDECTOMY     ???  HX COLONOSCOPY  01/2018   ??? HX HERNIA REPAIR     ??? HX OTHER SURGICAL  06/2016    Cat Scan: spot noted on lung   ??? HX UROLOGICAL N/A 01/14/2016    Cysto, Right RPG, and Protouch laser enucleation of the prostate with tissue morcellation - Dr. Claiborne Billings       Social History     Tobacco Use   ??? Smoking status: Former Smoker   ??? Smokeless tobacco: Former Systems developer     Quit date: 09/20/1988   Substance Use Topics   ??? Alcohol use: Yes   ??? Drug use: No       Allergies   Allergen Reactions   ??? Bee Sting  [Sting, Bee] Shortness of Breath       Family History   Problem Relation Age of Onset   ??? Colon Cancer Mother    ??? Colon Cancer Father        Current Outpatient Medications   Medication Sig Dispense Refill    ??? ergocalciferol (ERGOCALCIFEROL) 50,000 unit capsule Take 1 Cap by mouth every seven (7) days. 8 Cap 0   ??? EPIPEN 2-PAK 0.3 mg/0.3 mL injection      ??? ibuprofen (MOTRIN) 800 mg tablet      ??? meloxicam (MOBIC) 15 mg tablet Take 15 mg by mouth daily.     ??? clopidogrel (PLAVIX) 75 mg tab Take  by mouth.     ??? calcium cit-vit D3-isoflavon 2 100-1,000-80 mg-unit-mg tab Take  by mouth.     ??? cholecalciferol, vitamin D3, (VITAMIN D3) 2,000 unit tab Take  by mouth.     ??? multivitamin (ONE A DAY) tablet Take 1 Tab by mouth daily.     ??? aspirin delayed-release 81 mg tablet 81 mg.     ??? simvastatin (ZOCOR) 20 mg tablet      ??? losartan-hydroCHLOROthiazide (HYZAAR) 50-12.5 mg per tablet Take 1 Tab by Mouth Once a Day.         Review of Systems:  Constitutional: Fever: No  Skin: Rash: No  HEENT: Hearing difficulty: No  Eyes: Blurred vision: No  Cardiovascular: Chest pain: No  Respiratory: Shortness of breath: No  Gastrointestinal: Nausea/vomiting: No  Musculoskeletal: Back pain: No  Neurological: Weakness: No  Psychological: Memory loss: No  Comments/additional findings:   All other systems reviewed and are negative.    Physical Exam:  Visit Vitals  BP 136/82   Ht 5\' 11"  (1.803 m)   Wt 235 lb (106.6 kg)   BMI 32.78 kg/m??     Constitutional: WDWN, Pleasant and appropriate affect, No acute distress.    Respiratory: No respiratory distress or difficulties  Skin: No jaundice.    Neuro/Psych:  Alert and oriented. Affect appropriate.   GU DRE 09/26/2018: prostate flat no signs of recurrence     Imaging:  NM Bone Scan [facility] Body 12/22/2015  IMPRESSION:  No bone scan evidence of osseous metastatic disease.     Surgical Pathology 01/14/2016  FINAL DIAGNOSIS:   PROSTATE, TRANSURETHRAL RESECTION:   BENIGN NODULAR HYPERPLASIA    Prostate Biopsy Pathology:  result Gleason Grade cores   12/12/2015 4+3= 7 POS:9 Total:12     Biopsy Pathology 12/12/2015  DIAGNOSIS:   A. Right Apex Needle biopsy ??   ?? ?? ?? ?? ?? ?? ??Benign prostatic tissue.    B. Right Mid Needle biopsy ??   ?? ?? ?? ?? ?? ?? ??Benign prostatic tissue.   C. Right Base Needle biopsy ??   ?? ?? ?? ?? ?? ?? ??  ADENOCARCINOMA, GLEASON SCORE 3 + 3 = 6 involving 5 % of the specimen (1 of 1 core(s) positive).   ?? ?? ?? ?? ?? ?? ??Grade Group 1. Ends not involved by the tumor. Perineural invasion.   D. Right Lateral Apex Needle biopsy ??   ?? ?? ?? ?? ?? ?? ??Benign prostatic tissue.   E. Right Lateral Mid Needle biopsy ??   ?? ?? ?? ?? ?? ?? ??ADENOCARCINOMA, GLEASON SCORE 3 + 3 = 6 involving 10 % of the specimen (1 of 1 core(s) positive).   ?? ?? ?? ?? ?? ?? ??Grade Group 1. Ends not involved by the tumor.   F.??Right Lateral Base Needle biopsy ??   ?? ?? ?? ?? ?? ?? ??ADENOCARCINOMA, GLEASON SCORE 3 + 3 = 6 involving 5 % of the specimen (1 of 1 core(s) positive).   ?? ?? ?? ?? ?? ?? ??Grade Group 1. Ends not involved by the tumor.   G. Left Apex Needle biopsy ??   ?? ?? ?? ?? ?? ?? ??ADENOCARCINOMA, GLEASON SCORE 4 + 3 = 7 involving 20 % of the specimen, discontinuous (1 of 1 core(s) positive).   ?? ?? ?? ?? ?? ?? ??Gleason 4 comprises 70 % of the cancer. Grade Group 3. Ends not involved by the tumor.   H. Left Mid Needle biopsy ??   ?? ?? ?? ?? ?? ?? ??ADENOCARCINOMA, GLEASON SCORE 3 + 4 = 7 involving 60 % of the specimen, discontinuous (1 of 1 core(s) positive).   ?? ?? ?? ?? ?? ?? ??Gleason 4 comprises 20 % of the cancer. Grade Group 2. Tumor involves one end.   I. Left Base Needle biopsy ??   ?? ?? ?? ?? ?? ?? ??ADENOCARCINOMA, GLEASON SCORE 4 + 3 = 7 involving 80 % of the specimen, discontinuous (1 of 1 core(s) positive).   ?? ?? ?? ?? ?? ?? ??Gleason 4 comprises 70 % of the cancer. Grade Group 3. Tumor involves one end. Perineural invasion.   J. Left Lateral Apex Needle biopsy ??   ?? ?? ?? ?? ?? ?? ??ADENOCARCINOMA, GLEASON SCORE 3 + 4 = 7 involving 20 % of the specimen, discontinuous (1 of 1 core(s) positive).   ?? ?? ?? ?? ?? ?? ??Gleason 4 comprises 40 % of the cancer. Grade Group 2. Ends not involved by the tumor.   K. Left Lateral Mid Needle biopsy ??    ?? ?? ?? ?? ?? ?? ??ADENOCARCINOMA, GLEASON SCORE 4 + 3 = 7 involving 60 % of the specimen (3 of 3 core(s) positive).   ?? ?? ?? ?? ?? ?? ??Gleason 4 comprises 70 % of the cancer. Grade Group 3. Tumor involves one end. Perineural invasion.   L. Left Lateral Base Needle biopsy ??   ?? ?? ?? ?? ?? ?? ??ADENOCARCINOMA, GLEASON SCORE 4 + 3 = 7 involving 95 % of the specimen (1 of 1 core(s) positive).   ?? ?? ?? ?? ?? ?? ??Gleason 4 comprises 70 % of the cancer. Grade Group 3. Ends not involved by the tumor. Perineural invasion.     Labs reviewed today:   Recent Results (from the past 24 hour(s))   PROSTATE SPECIFIC ANTIGEN, TOTAL (PSA)    Collection Time: 09/26/18  9:57 AM   Result Value Ref Range    Prostate Specific Ag 0.25 0.00 - 4.00 ng/mL   TESTOSTERONE    Collection Time: 09/26/18  9:57 AM   Result Value Ref Range  Testosterone 187 (L) 348 - 1,197 ng/dL   AMB POC URINALYSIS DIP STICK AUTO W/O MICRO    Collection Time: 09/26/18 10:45 AM   Result Value Ref Range    Color (UA POC)      Clarity (UA POC)      Glucose (UA POC) Negative Negative    Bilirubin (UA POC) Negative Negative    Ketones (UA POC) Negative Negative    Specific gravity (UA POC) 1.020 1.001 - 1.035    Blood (UA POC) Trace Negative    pH (UA POC) 5.0 4.6 - 8.0    Protein (UA POC) Negative Negative    Urobilinogen (UA POC) 0.2 mg/dL 0.2 - 1    Nitrites (UA POC) Negative Negative    Leukocyte esterase (UA POC) Negative Negative   AMB POC PVR, MEAS,POST-VOID RES,US,NON-IMAGING    Collection Time: 09/26/18 10:46 AM   Result Value Ref Range    PVR 0 cc     PSA /TESTOSTERONE - BSHSI PSA Testosterone, total   Latest Ref Rng & Units 0 - 4 ng/mL 348 - 1197 ng/dL   09/26/2018 0.25 187   07/20/2018 0.11 215   03/14/2018 0.11 184   10/05/2017 0.08 131   04/26/2017 0.04 51   01/25/2017 <0.03    10/26/2016 <0.03 <3 (L)   08/03/2016 <0.03 <3 (L)   11/03/2015 3.75    08/07/2015 5.40 (H)        Kathreen Cornfield, MD  [facility]  [place], [place]   Phone: [phone]  Pager: [phone]    CC:  Paul Dykes, PA-C     Patient's BMI is out of the normal parameters.  Information about BMI was given and patient was advised to follow-up with their PCP for further management.    Medical documentation entered into the chart by Nicky Pugh, medical scribe for Kathreen Cornfield, MD on 09/26/2018.

## 2018-09-26 NOTE — Progress Notes (Signed)
Colton Cox presents today for lab draw per Dr. Evette Georges order.     PSA and TST obtained via venipuncture without any difficulty.    Patient will be notified with lab results.     Orders Placed This Encounter   ??? PROSTATE SPECIFIC ANTIGEN, TOTAL (PSA)   ??? TESTOSTERONE   ??? COLLECTION VENOUS BLOOD,VENIPUNCTURE     Pt late for check in. Same Day PSA and TST sent for processing.    [name]

## 2018-12-27 ENCOUNTER — Encounter: Payer: MEDICARE | Primary: Medical

## 2018-12-27 ENCOUNTER — Institutional Professional Consult (permissible substitution): Admit: 2018-12-27 | Payer: MEDICARE | Primary: Medical

## 2018-12-27 DIAGNOSIS — C61 Malignant neoplasm of prostate: Secondary | ICD-10-CM

## 2018-12-27 NOTE — Progress Notes (Signed)
PSA up slightly, will recheck in 3 months as scheduled

## 2018-12-27 NOTE — Progress Notes (Signed)
 Colton Cox presents today for lab draw per Dr. Joesphine order.   Dr. Ceclia was present in the clinic as incident to.     PSA obtained via venipuncture without any difficulty.    Patient will be notified with lab results.       Orders Placed This Encounter   . PROSTATE SPECIFIC ANTIGEN, TOTAL (PSA)   . COLLECTION VENOUS BLOOD,VENIPUNCTURE       [name]

## 2018-12-27 NOTE — Progress Notes (Signed)
Colton Cox presents today for lab draw per Dr. Evette Georges order.   Dr. Harvel Quale was present in the clinic as incident to.     PSA obtained via venipuncture without any difficulty.    Patient will be notified with lab results.       Orders Placed This Encounter   ??? PROSTATE SPECIFIC ANTIGEN, TOTAL (PSA)   ??? COLLECTION VENOUS BLOOD,VENIPUNCTURE       [name]

## 2018-12-28 LAB — PROSTATE SPECIFIC ANTIGEN, TOTAL (PSA)
PSA: 0.42 ng/mL (ref 0.00–4.00)
Prostate Specific Ag: 0.42 ng/mL (ref 0.00–4.00)

## 2019-03-21 ENCOUNTER — Ambulatory Visit: Admit: 2019-03-21 | Discharge: 2019-03-21 | Payer: MEDICARE | Attending: Physician Assistant | Primary: Medical

## 2019-03-21 ENCOUNTER — Ambulatory Visit: Attending: Physician Assistant | Primary: Medical

## 2019-03-21 DIAGNOSIS — C61 Malignant neoplasm of prostate: Secondary | ICD-10-CM

## 2019-03-21 LAB — PROSTATE SPECIFIC ANTIGEN, TOTAL (PSA)
PSA: 0.66 ng/mL (ref 0.00–4.00)
Prostate Specific Ag: 0.66 ng/mL (ref 0.00–4.00)

## 2019-03-21 LAB — TESTOSTERONE
Testosterone: 208 ng/dL — ABNORMAL LOW (ref 348–1197)
Testosterone: 208 ng/dL — ABNORMAL LOW (ref 348–1197)

## 2019-03-21 MED ORDER — TAMSULOSIN SR 0.4 MG 24 HR CAP
0.4 mg | ORAL_CAPSULE | Freq: Every day | ORAL | 3 refills | Status: DC
Start: 2019-03-21 — End: 2020-03-14

## 2019-03-21 NOTE — Progress Notes (Signed)
Progress Notes by Teodora Medici, PA-C at 03/21/19 1430                Author: Teodora Medici, PA-C  Service: --  Author Type: Physician Assistant       Filed: 03/21/19 1447  Encounter Date: 03/21/2019  Status: Signed          Editor: Charyl Dancer (Physician Assistant)                       Colton Cox   DOB 25-Oct-1945   73 y.o.      PROSTATE CANCER ESTABLISHED DIAGNOSIS        Encounter Diagnoses              ICD-10-CM  ICD-9-CM          1.  Prostate cancer ([place])  C61  185     2.  BPH with obstruction/lower urinary tract symptoms  N40.1  600.01           N13.8  599.69        Assessment:   1. 73 y.o. male with cT1cNxM0 GS 4+3, Prostate cancer in 9/12 cores, prostate volume of   49.57 cm3, Pre-biopsy PSA= 3.75ng/mL.      Bone scan on 12/22/15 negative for metastatic prostate cancer. CTU 2/17 negative    ADT initiated on 02/06/16 with Eligard 45 mg injection.  Single dose.    Completed XRT 03/18/16-04/23/16 with Dr. Tobe Sos     Most recent PSA: 0.66 ng/mL on 03/21/19, previously 0.25 ng/mL on 09/26/18.    No evidence recurrence on DRE today 03/21/2019      2. 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 Mild worsening of symptoms with c/o weak stream and hesitancy. PVR 0 cc at last office visit.         3. History of microscopic hematuria, 1+ blood and 5-10 RBC by UA on 11/11/15.    Urine cytology 10/2015 showed rare atypical urothelial cells.     CT Urogram 11/21/15 showed slightly attenuated and perhaps slightly thickened mid-distal right ureter/equivocally strictured, but without proximal dilatation. Rretrograde ureterography in OR 4/26 w/o evidence of filling defect    No gross hematuria      4. 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 Vit D 18.6 on 03/14/18      4. History of TIA on Plavix 75 mg and aspirin 81 mg daily   5. History of tobacco use      Plan:   Reviewed mst recent PSA 0.66 ng/mL on 03/21/2019   Definition of BCR s/p RT reviewed with patient   Recommend  continued close monitoring at this time   Restart flomax 0.4 mg QHS given worsening LUTS   Nurse visit 3 months PSA and Vit D   FU 6 months with PSA, T      Chief Complaint:      Chief Complaint       Patient presents with        ?  Prostate Cancer           HPI: Colton Cox  73 y.o. MALE who returns for follow-up of his prostate cancer cT1cNxM0 GS 4+3, Prostate cancer in 9/12 cores, prostate volume of  49.57 cm3, Pre-biopsy PSA= 3.75ng/mL s/p ADT and XRT completed 04/2016  and BPH s/p ProTouch 12/2015.   Most recent PSA 0.66 ng/mL on 03/21/19, previously 0.25 ng/mL on  09/26/18.      Patient reports his urinary symptoms are stable but still worse from after ProTouch, specifically stream is weaker and increased urinary hesitancy. Feels like he does empty his bladder completely. PVR 0 cc last office visit. Denies leakage but states  he has occasional "spurts" of urine. Denies gross hematuria or dysuria. No new bone or back pain      Previous Hjstory   Patient underwent prostate biopsy on 12/12/15, which revealed GS 4+3 intermediate risk CaP in 9/12 cores. Bone scan on 12/22/15 negative for osseous metastatic disease. He opted for radiation + ADT. He is s/p ProTouch laser enucleation of the prostate with  tissue morcellation on 01/14/2016. ADT initiated on 02/06/16 with Eligard 45 mg injection.   Completed XRT 03/18/16-04/23/16.         Past Medical History:        Diagnosis  Date         ?  Cerebral artery occlusion with cerebral infarction [facility])       ?  Colon polyps       ?  Hypertension           ?  Personal history of prostate cancer               Past Surgical History:         Procedure  Laterality  Date          ?  APPENDECTOMY         ?  HX COLONOSCOPY    01/2018     ?  HX HERNIA REPAIR         ?  HX OTHER SURGICAL    06/2016          Cat Scan: spot noted on lung          ?  HX UROLOGICAL  N/A  01/14/2016          Cysto, Right RPG, and Protouch laser enucleation of the prostate with tissue morcellation - Dr. Claiborne Billings              Social History          Tobacco Use         ?  Smoking status:  Former Smoker     ?  Smokeless tobacco:  Former Systems developer              Quit date:  09/20/1988       Substance Use Topics         ?  Alcohol use:  Yes         ?  Drug use:  No             Allergies        Allergen  Reactions         ?  Bee Sting  [Sting, Bee]  Shortness of Breath             Family History         Problem  Relation  Age of Onset          ?  Colon Cancer  Mother            ?  Colon Cancer  Father               Current Outpatient Medications          Medication  Sig  Dispense  Refill           ?  tamsulosin (FLOMAX) 0.4 mg capsule  Take 1 Cap by mouth daily (after dinner).  90 Cap  3     ?  ergocalciferol (ERGOCALCIFEROL) 50,000 unit capsule  Take 1 Cap by mouth every seven (7) days.  8 Cap  0     ?  EPIPEN 2-PAK 0.3 mg/0.3 mL injection           ?  ibuprofen (MOTRIN) 800 mg tablet           ?  meloxicam (MOBIC) 15 mg tablet  Take 15 mg by mouth daily.         ?  clopidogrel (PLAVIX) 75 mg tab  Take  by mouth.         ?  calcium cit-vit D3-isoflavon 2 100-1,000-80 mg-unit-mg tab  Take  by mouth.         ?  cholecalciferol, vitamin D3, (VITAMIN D3) 2,000 unit tab  Take  by mouth.         ?  multivitamin (ONE A DAY) tablet  Take 1 Tab by mouth daily.         ?  aspirin delayed-release 81 mg tablet  81 mg.         ?  simvastatin (ZOCOR) 20 mg tablet                 ?  losartan-hydroCHLOROthiazide (HYZAAR) 50-12.5 mg per tablet  Take 1 Tab by Mouth Once a Day.               Review of Systems:   Constitutional: Fever: No   Skin: Rash: No   HEENT: Hearing difficulty: No   Eyes: Blurred vision: No   Cardiovascular: Chest pain: No   Respiratory: Shortness of breath: No   Gastrointestinal: Nausea/vomiting: No   Musculoskeletal: Back pain: No   Neurological: Weakness: No   Psychological: Memory loss: No   Comments/additional findings:    All other systems reviewed and are negative.      Physical Exam:   Visit Vitals      Ht  5\' 11"  (1.803 m)     Wt   240 lb (108.9 kg)        BMI  33.47 kg/m??        Constitutional: WDWN, Pleasant and appropriate affect, No acute distress .     Respiratory: No respiratory distress or difficulties   Skin: No jaundice.     Neuro/Psych:  Alert and o riented. Affect appropriate.    DRE: prostate flat no signs of recurrence       Imaging:   NM Bone Scan [facility] Body 12/22/2015   IMPRESSION:  No bone scan evidence of osseous metastatic disease.       Surgical Pathology 01/14/2016   FINAL DIAGNOSIS:    PROSTATE, TRANSURETHRAL RESECTION:    BENIGN NODULAR HYPERPLASIA      Prostate Biopsy Pathology:       result  Gleason Grade  cores          12/12/2015  4+3= 7  POS:9  Total:12        Biopsy Pathology 12/12/2015   DIAGNOSIS:   A. Right Apex Needle biopsy ??   ?? ?? ?? ?? ?? ?? ??Benign prostatic tissue.   B. Right Mid Needle biopsy ??   ?? ?? ??  ?? ?? ?? ??Benign prostatic tissue.   C. Right Base Needle biopsy ??   ?? ?? ?? ?? ?? ?? ??ADENOCARCINOMA, GLEASON SCORE 3 + 3 =  6 involving 5 % of the specimen (1 of 1 core(s) positive).   ?? ?? ?? ?? ??  ?? ??Grade Group 1. Ends not involved by the tumor. Perineural invasion.   D. Right Lateral Apex Needle biopsy ??   ?? ?? ?? ?? ?? ?? ??Benign prostatic tissue.   E. Right Lateral Mid Needle biopsy ??   ?? ??  ?? ?? ?? ?? ??ADENOCARCINOMA, GLEASON SCORE 3 + 3 = 6 involving 10 % of the specimen (1 of 1 core(s) positive).   ?? ?? ?? ?? ?? ?? ??Grade Group 1. Ends not involved by the tumor.   F.??Right Lateral Base Needle biopsy  ??   ?? ?? ?? ?? ?? ?? ??ADENOCARCINOMA, GLEASON SCORE 3 + 3 = 6 involving 5 % of the specimen (1 of 1 core(s) positive).   ?? ?? ?? ?? ?? ?? ??Grade Group 1. Ends not involved by the tumor.   G. Left Apex Needle  biopsy ??   ?? ?? ?? ?? ?? ?? ??ADENOCARCINOMA, GLEASON SCORE 4 + 3 = 7 involving 20 % of the specimen, discontinuous (1 of 1 core(s) positive).   ?? ?? ?? ?? ?? ?? ??Gleason 4 comprises 70 % of the cancer. Grade  Group 3. Ends not involved by the tumor.   H. Left Mid Needle biopsy ??   ?? ?? ?? ?? ?? ?? ??ADENOCARCINOMA, GLEASON SCORE 3 + 4 = 7 involving 60 % of  the specimen, discontinuous (1 of 1 core(s) positive).   ?? ?? ??  ?? ?? ?? ??Gleason 4 comprises 20 % of the cancer. Grade Group 2. Tumor involves one end.   I. Left Base Needle biopsy ??   ?? ?? ?? ?? ?? ?? ??ADENOCARCINOMA, GLEASON SCORE 4 + 3 = 7 involving 80 % of the specimen, discontinuous  (1 of 1 core(s) positive).   ?? ?? ?? ?? ?? ?? ??Gleason 4 comprises 70 % of the cancer. Grade Group 3. Tumor involves one end. Perineural invasion.   J. Left Lateral Apex Needle biopsy ??   ?? ?? ?? ?? ?? ?? ??ADENOCARCINOMA,  GLEASON SCORE 3 + 4 = 7 involving 20 % of the specimen, discontinuous (1 of 1 core(s) positive).   ?? ?? ?? ?? ?? ?? ??Gleason 4 comprises 40 % of the cancer. Grade Group 2. Ends not involved by the tumor.   K. Left Lateral Mid Needle  biopsy ??   ?? ?? ?? ?? ?? ?? ??ADENOCARCINOMA, GLEASON SCORE 4 + 3 = 7 involving 60 % of the specimen (3 of 3 core(s) positive).   ?? ?? ?? ?? ?? ?? ??Gleason 4 comprises 70 % of the cancer. Grade Group 3. Tumor  involves one end. Perineural invasion.   L. Left Lateral Base Needle biopsy ??   ?? ?? ?? ?? ?? ?? ??ADENOCARCINOMA, GLEASON SCORE 4 + 3 = 7 involving 95 % of the specimen (1 of 1 core(s) positive).   ?? ?? ?? ?? ??  ?? ??Gleason 4 comprises 70 % of the cancer. Grade Group 3. Ends not involved by the tumor. Perineural invasion.       Labs reviewed today:      Recent Results (from the past 24 hour(s))     PROSTATE SPECIFIC ANTIGEN, TOTAL (PSA)          Collection Time: 03/21/19  1:28 PM         Result  Value  Ref Range  Prostate Specific Ag  0.66  0.00 - 4.00 ng/mL       TESTOSTERONE          Collection Time: 03/21/19  1:28 PM         Result  Value  Ref Range            Testosterone  208 (L)  348 - 1,197 ng/dL            PSA /TESTOSTERONE - B[facility]SI  PSA  Testosterone, total     Latest Ref Rng & Units  0 - 4 ng/mL  348 - 1197 ng/dL         03/21/2019  0.66  208     12/27/2018  0.42       09/26/2018  0.25  187     07/20/2018  0.11  215     03/14/2018  0.11  184     10/05/2017  0.08  131     04/26/2017  0.04  51     01/25/2017   <0.03           10/26/2016  <0.03  <3 (L)     08/03/2016  <0.03  <3 (L)         11/03/2015  3.75           08/07/2015  5.40 (H)          Teodora Medici PA-C   [facility] [place]       CC:   Karna Christmas, [place]       Medical documentation entered into the chart by Nicky Pugh, medical scribe for Teodora Medici, PA-C on  03/21/2019.

## 2019-03-21 NOTE — Progress Notes (Signed)
 Colton Cox presents today for lab draw per Lyle Schaffer, PA's order.     PSA and TST obtained via venipuncture without any difficulty.    Patient will be notified with lab results.     Orders Placed This Encounter   . PROSTATE SPECIFIC ANTIGEN, TOTAL (PSA)   . TESTOSTERONE   . COLLECTION VENOUS BLOOD,VENIPUNCTURE       Colton Cox

## 2019-03-21 NOTE — Progress Notes (Signed)
Colton Cox  DOB Nov 07, 1945  73 y.o.    PROSTATE CANCER ESTABLISHED DIAGNOSIS    Encounter Diagnoses     ICD-10-CM ICD-9-CM   1. Prostate cancer ([place]) C61 185   2. BPH with obstruction/lower urinary tract symptoms N40.1 600.01    N13.8 599.69     Assessment:  1. 74 y.o. male with cT1cNxM0 GS 4+3, Prostate cancer in 9/12 cores, prostate volume of  49.57 cm3, Pre-biopsy PSA= 3.75ng/mL.     Bone scan on 12/22/15 negative for metastatic prostate cancer. CTU 2/17 negative   ADT initiated on 02/06/16 with Eligard 45 mg injection.  Single dose.   Completed XRT 03/18/16-04/23/16 with Dr. Tobe Sos    Most recent PSA: 0.66 ng/mL on 03/21/19, previously 0.25 ng/mL on 09/26/18.   No evidence recurrence on DRE today 03/21/2019    2. 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 Mild worsening of symptoms with c/o weak stream and hesitancy. PVR 0 cc at last office visit.       3. History of microscopic hematuria, 1+ blood and 5-10 RBC by UA on 11/11/15.   Urine cytology 10/2015 showed rare atypical urothelial cells.    CT Urogram 11/21/15 showed slightly attenuated and perhaps slightly thickened mid-distal right ureter/equivocally strictured, but without proximal dilatation. Rretrograde ureterography in OR 4/26 w/o evidence of filling defect   No gross hematuria    4. 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 Vit D 18.6 on 03/14/18    4. History of TIA on Plavix 75 mg and aspirin 81 mg daily  5. History of tobacco use    Plan:  Reviewed mst recent PSA 0.66 ng/mL on 03/21/2019  Definition of BCR s/p RT reviewed with patient  Recommend continued close monitoring at this time  Restart flomax 0.4 mg QHS given worsening LUTS  Nurse visit 3 months PSA and Vit D  FU 6 months with PSA, T    Chief Complaint:   Chief Complaint   Patient presents with   ??? Prostate Cancer        HPI: Colton Cox 73 y.o. MALE who returns for follow-up of his prostate cancer cT1cNxM0 GS 4+3, Prostate cancer in 9/12 cores, prostate volume of  49.57 cm3, Pre-biopsy PSA= 3.75ng/mL s/p ADT and XRT completed 04/2016 and BPH s/p ProTouch 12/2015.  Most recent PSA 0.66 ng/mL on 03/21/19, previously 0.25 ng/mL on 09/26/18.    Patient reports his urinary symptoms are stable but still worse from after ProTouch, specifically stream is weaker and increased urinary hesitancy. Feels like he does empty his bladder completely. PVR 0 cc last office visit. Denies leakage but states he has occasional "spurts" of urine. Denies gross hematuria or dysuria. No new bone or back pain    Previous Hjstory  Patient underwent prostate biopsy on 12/12/15, which revealed GS 4+3 intermediate risk CaP in 9/12 cores. Bone scan on 12/22/15 negative for osseous metastatic disease. He opted for radiation + ADT. He is s/p ProTouch laser enucleation of the prostate with tissue morcellation on 01/14/2016. ADT initiated on 02/06/16 with Eligard 45 mg injection.  Completed XRT 03/18/16-04/23/16.     Past Medical History:   Diagnosis Date   ??? Cerebral artery occlusion with cerebral infarction [facility])    ??? Colon polyps    ??? Hypertension    ??? Personal history of prostate cancer        Past Surgical History:   Procedure Laterality Date   ???  APPENDECTOMY     ??? HX COLONOSCOPY  01/2018   ??? HX HERNIA REPAIR     ??? HX OTHER SURGICAL  06/2016    Cat Scan: spot noted on lung   ??? HX UROLOGICAL N/A 01/14/2016    Cysto, Right RPG, and Protouch laser enucleation of the prostate with tissue morcellation - Dr. Claiborne Billings       Social History     Tobacco Use   ??? Smoking status: Former Smoker   ??? Smokeless tobacco: Former Systems developer     Quit date: 09/20/1988   Substance Use Topics   ??? Alcohol use: Yes   ??? Drug use: No       Allergies   Allergen Reactions   ??? Bee Sting  [Sting, Bee] Shortness of Breath       Family History   Problem Relation Age of Onset   ??? Colon Cancer Mother     ??? Colon Cancer Father        Current Outpatient Medications   Medication Sig Dispense Refill   ??? tamsulosin (FLOMAX) 0.4 mg capsule Take 1 Cap by mouth daily (after dinner). 90 Cap 3   ??? ergocalciferol (ERGOCALCIFEROL) 50,000 unit capsule Take 1 Cap by mouth every seven (7) days. 8 Cap 0   ??? EPIPEN 2-PAK 0.3 mg/0.3 mL injection      ??? ibuprofen (MOTRIN) 800 mg tablet      ??? meloxicam (MOBIC) 15 mg tablet Take 15 mg by mouth daily.     ??? clopidogrel (PLAVIX) 75 mg tab Take  by mouth.     ??? calcium cit-vit D3-isoflavon 2 100-1,000-80 mg-unit-mg tab Take  by mouth.     ??? cholecalciferol, vitamin D3, (VITAMIN D3) 2,000 unit tab Take  by mouth.     ??? multivitamin (ONE A DAY) tablet Take 1 Tab by mouth daily.     ??? aspirin delayed-release 81 mg tablet 81 mg.     ??? simvastatin (ZOCOR) 20 mg tablet      ??? losartan-hydroCHLOROthiazide (HYZAAR) 50-12.5 mg per tablet Take 1 Tab by Mouth Once a Day.         Review of Systems:  Constitutional: Fever: No  Skin: Rash: No  HEENT: Hearing difficulty: No  Eyes: Blurred vision: No  Cardiovascular: Chest pain: No  Respiratory: Shortness of breath: No  Gastrointestinal: Nausea/vomiting: No  Musculoskeletal: Back pain: No  Neurological: Weakness: No  Psychological: Memory loss: No  Comments/additional findings:   All other systems reviewed and are negative.    Physical Exam:  Visit Vitals  Ht 5\' 11"  (1.803 m)   Wt 240 lb (108.9 kg)   BMI 33.47 kg/m??     Constitutional: WDWN, Pleasant and appropriate affect, No acute distress.    Respiratory: No respiratory distress or difficulties  Skin: No jaundice.    Neuro/Psych:  Alert and oriented. Affect appropriate.   DRE: prostate flat no signs of recurrence     Imaging:  NM Bone Scan [facility] Body 12/22/2015  IMPRESSION:  No bone scan evidence of osseous metastatic disease.     Surgical Pathology 01/14/2016  FINAL DIAGNOSIS:   PROSTATE, TRANSURETHRAL RESECTION:   BENIGN NODULAR HYPERPLASIA    Prostate Biopsy Pathology:  result Gleason Grade cores    12/12/2015 4+3= 7 POS:9 Total:12     Biopsy Pathology 12/12/2015  DIAGNOSIS:   A. Right Apex Needle biopsy ??   ?? ?? ?? ?? ?? ?? ??Benign prostatic tissue.   B. Right Mid Needle biopsy ??   ?? ?? ?? ?? ?? ?? ??  Benign prostatic tissue.   C. Right Base Needle biopsy ??   ?? ?? ?? ?? ?? ?? ??ADENOCARCINOMA, GLEASON SCORE 3 + 3 = 6 involving 5 % of the specimen (1 of 1 core(s) positive).   ?? ?? ?? ?? ?? ?? ??Grade Group 1. Ends not involved by the tumor. Perineural invasion.   D. Right Lateral Apex Needle biopsy ??   ?? ?? ?? ?? ?? ?? ??Benign prostatic tissue.   E. Right Lateral Mid Needle biopsy ??   ?? ?? ?? ?? ?? ?? ??ADENOCARCINOMA, GLEASON SCORE 3 + 3 = 6 involving 10 % of the specimen (1 of 1 core(s) positive).   ?? ?? ?? ?? ?? ?? ??Grade Group 1. Ends not involved by the tumor.   F.??Right Lateral Base Needle biopsy ??   ?? ?? ?? ?? ?? ?? ??ADENOCARCINOMA, GLEASON SCORE 3 + 3 = 6 involving 5 % of the specimen (1 of 1 core(s) positive).   ?? ?? ?? ?? ?? ?? ??Grade Group 1. Ends not involved by the tumor.   G. Left Apex Needle biopsy ??   ?? ?? ?? ?? ?? ?? ??ADENOCARCINOMA, GLEASON SCORE 4 + 3 = 7 involving 20 % of the specimen, discontinuous (1 of 1 core(s) positive).   ?? ?? ?? ?? ?? ?? ??Gleason 4 comprises 70 % of the cancer. Grade Group 3. Ends not involved by the tumor.   H. Left Mid Needle biopsy ??   ?? ?? ?? ?? ?? ?? ??ADENOCARCINOMA, GLEASON SCORE 3 + 4 = 7 involving 60 % of the specimen, discontinuous (1 of 1 core(s) positive).   ?? ?? ?? ?? ?? ?? ??Gleason 4 comprises 20 % of the cancer. Grade Group 2. Tumor involves one end.   I. Left Base Needle biopsy ??   ?? ?? ?? ?? ?? ?? ??ADENOCARCINOMA, GLEASON SCORE 4 + 3 = 7 involving 80 % of the specimen, discontinuous (1 of 1 core(s) positive).   ?? ?? ?? ?? ?? ?? ??Gleason 4 comprises 70 % of the cancer. Grade Group 3. Tumor involves one end. Perineural invasion.   J. Left Lateral Apex Needle biopsy ??   ?? ?? ?? ?? ?? ?? ??ADENOCARCINOMA, GLEASON SCORE 3 + 4 = 7 involving 20 % of the specimen, discontinuous (1 of 1 core(s) positive).    ?? ?? ?? ?? ?? ?? ??Gleason 4 comprises 40 % of the cancer. Grade Group 2. Ends not involved by the tumor.   K. Left Lateral Mid Needle biopsy ??   ?? ?? ?? ?? ?? ?? ??ADENOCARCINOMA, GLEASON SCORE 4 + 3 = 7 involving 60 % of the specimen (3 of 3 core(s) positive).   ?? ?? ?? ?? ?? ?? ??Gleason 4 comprises 70 % of the cancer. Grade Group 3. Tumor involves one end. Perineural invasion.   L. Left Lateral Base Needle biopsy ??   ?? ?? ?? ?? ?? ?? ??ADENOCARCINOMA, GLEASON SCORE 4 + 3 = 7 involving 95 % of the specimen (1 of 1 core(s) positive).   ?? ?? ?? ?? ?? ?? ??Gleason 4 comprises 70 % of the cancer. Grade Group 3. Ends not involved by the tumor. Perineural invasion.     Labs reviewed today:   Recent Results (from the past 24 hour(s))   PROSTATE SPECIFIC ANTIGEN, TOTAL (PSA)    Collection Time: 03/21/19  1:28 PM   Result Value Ref Range    Prostate Specific Ag 0.66 0.00 - 4.00 ng/mL  TESTOSTERONE    Collection Time: 03/21/19  1:28 PM   Result Value Ref Range    Testosterone 208 (L) 348 - 1,197 ng/dL     PSA /TESTOSTERONE - [facility] PSA Testosterone, total   Latest Ref Rng & Units 0 - 4 ng/mL 348 - 1197 ng/dL   03/21/2019 0.66 208   12/27/2018 0.42    09/26/2018 0.25 187   07/20/2018 0.11 215   03/14/2018 0.11 184   10/05/2017 0.08 131   04/26/2017 0.04 51   01/25/2017 <0.03    10/26/2016 <0.03 <3 (L)   08/03/2016 <0.03 <3 (L)   11/03/2015 3.75    08/07/2015 5.40 (H)      Teodora Medici PA-C  [facility] [place]     CC:  Karna Christmas, [place]     Medical documentation entered into the chart by Nicky Pugh, medical scribe for Teodora Medici, PA-C on 03/21/2019.

## 2019-03-21 NOTE — Progress Notes (Signed)
Colton Cox presents today for lab draw per Teodora Medici, PA's order.     PSA and TST obtained via venipuncture without any difficulty.    Patient will be notified with lab results.     Orders Placed This Encounter   ??? PROSTATE SPECIFIC ANTIGEN, TOTAL (PSA)   ??? TESTOSTERONE   ??? COLLECTION VENOUS BLOOD,VENIPUNCTURE       Colton Cox

## 2019-06-21 ENCOUNTER — Institutional Professional Consult (permissible substitution): Admit: 2019-06-21 | Payer: MEDICARE | Primary: Medical

## 2019-06-21 DIAGNOSIS — C61 Malignant neoplasm of prostate: Secondary | ICD-10-CM

## 2019-06-21 LAB — PROSTATE SPECIFIC ANTIGEN, TOTAL (PSA)
PSA: 1.06 ng/mL (ref 0.00–4.00)
Prostate Specific Ag: 1.06 ng/mL (ref 0.00–4.00)

## 2019-06-21 NOTE — Progress Notes (Signed)
Noted. Forwarded to patient. Will plan to repeat PSA in 3 months as scheduled. Does not currently meet criteria for BCR

## 2019-06-21 NOTE — Progress Notes (Signed)
 Colton Cox presents today for lab draw per Lyle Schaffer, PA's order.   Dr. Goldin was present in the clinic as incident to.     PSA and Vitamin D  obtained via venipuncture without any difficulty.    Patient will be notified with lab results.       Orders Placed This Encounter   . PROSTATE SPECIFIC ANTIGEN, TOTAL (PSA)   . VITAMIN D , 25 HYDROXY   . COLLECTION VENOUS BLOOD,VENIPUNCTURE       Colton Cox

## 2019-06-21 NOTE — Progress Notes (Signed)
Colton Cox presents today for lab draw per Teodora Medici, PA's order.   Dr. Donovan Kail was present in the clinic as incident to.     PSA and Vitamin D obtained via venipuncture without any difficulty.    Patient will be notified with lab results.       Orders Placed This Encounter   ??? PROSTATE SPECIFIC ANTIGEN, TOTAL (PSA)   ??? VITAMIN D, 25 HYDROXY   ??? COLLECTION VENOUS BLOOD,VENIPUNCTURE       [name]

## 2019-06-22 LAB — VITAMIN D, 25 HYDROXY
VITAMIN D, 25-HYDROXY: 35 ng/ml (ref 30.0–100.0)
Vit D, 25-Hydroxy: 35 ng/ml (ref 30.0–100.0)

## 2019-09-19 ENCOUNTER — Institutional Professional Consult (permissible substitution): Admit: 2019-09-19 | Discharge: 2019-09-19 | Payer: MEDICARE | Primary: Medical

## 2019-09-19 DIAGNOSIS — C61 Malignant neoplasm of prostate: Secondary | ICD-10-CM

## 2019-09-19 LAB — PROSTATE SPECIFIC ANTIGEN, TOTAL (PSA)
PSA: 1.39 ng/mL (ref 0.00–4.00)
Prostate Specific Ag: 1.39 ng/mL (ref 0.00–4.00)

## 2019-09-19 LAB — TESTOSTERONE
Testosterone: 270 ng/dL — ABNORMAL LOW (ref 348–1197)
Testosterone: 270 ng/dL — ABNORMAL LOW (ref 348–1197)

## 2019-09-19 NOTE — Progress Notes (Signed)
Colton Cox presents today for lab draw per Dr. Forestine Chute order.   Dr. Tomasa Blase was present in the clinic as incident to.     PSA and TST obtained via venipuncture without any difficulty.    Patient will be notified with lab results.       Orders Placed This Encounter   . PROSTATE SPECIFIC ANTIGEN, TOTAL (PSA)   . TESTOSTERONE   . COLLECTION VENOUS BLOOD,VENIPUNCTURE       Tomma Rakers, LPN

## 2019-09-19 NOTE — Progress Notes (Signed)
Colton Cox presents today for lab draw per Dr. Archie Balboa order.   Dr. Delena Bali was present in the clinic as incident to.     PSA and TST obtained via venipuncture without any difficulty.    Patient will be notified with lab results.       Orders Placed This Encounter   ??? PROSTATE SPECIFIC ANTIGEN, TOTAL (PSA)   ??? TESTOSTERONE   ??? COLLECTION VENOUS BLOOD,VENIPUNCTURE       Michel Bickers, LPN

## 2019-09-26 ENCOUNTER — Encounter: Payer: MEDICARE | Attending: Urology | Primary: Medical

## 2019-09-26 ENCOUNTER — Ambulatory Visit: Admit: 2019-09-26 | Discharge: 2019-09-26 | Attending: Urology | Primary: Medical

## 2019-09-26 ENCOUNTER — Encounter: Payer: MEDICARE | Attending: Physician Assistant | Primary: Medical

## 2019-09-26 ENCOUNTER — Ambulatory Visit: Attending: Urology | Primary: Medical

## 2019-09-26 DIAGNOSIS — C61 Malignant neoplasm of prostate: Secondary | ICD-10-CM

## 2019-09-26 NOTE — Progress Notes (Signed)
Progress Notes by Teodora Medici, PA-C at 09/26/19 1430                Author: Teodora Medici, PA-C  Service: --  Author Type: Physician Assistant       Filed: 09/26/19 1719  Encounter Date: 09/26/2019  Status: Signed          Editor: Charyl Dancer (Physician Assistant)                       Colton Cox   DOB Dec 24, 1945   74 y.o.      PROSTATE CANCER ESTABLISHED DIAGNOSIS        Encounter Diagnoses              ICD-10-CM  ICD-9-CM          1.  Prostate cancer ([place])   C61  185        Assessment:   1. 74 y.o. male with cT1cNxM0 GS 4+3, Prostate cancer in 9/12 cores, prostate volume of   49.57 cm3, Pre-biopsy PSA= 3.75ng/mL.      Bone scan on 12/22/15 negative for metastatic prostate cancer. CTU 2/17 negative    ADT initiated on 02/06/16 with Eligard 45 mg injection.  Single dose.    Completed XRT 03/18/16-04/23/16 with Dr. Tobe Sos     Most recent PSA: 0.66 ng/mL on 03/21/19, previously 0.25 ng/mL on 09/26/18.    No evidence recurrence on DRE 03/21/2019      2. 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 Mild worsening of symptoms with c/o weak stream and hesitancy. PVR 0 cc previously        3. History of microscopic hematuria, 1+ blood and 5-10 RBC by UA on 11/11/15.    Urine cytology 10/2015 showed rare atypical urothelial cells.     CT Urogram 11/21/15 showed slightly attenuated and perhaps slightly thickened mid-distal right ureter/equivocally strictured, but without proximal dilatation. Rretrograde ureterography in OR 4/26 w/o evidence of filling defect    No gross hematuria      4. 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 Vit D 18.6 on 03/14/18      4. History of TIA on Plavix 75 mg and aspirin 81 mg daily   5. History of tobacco use      Plan:   Reviewed mst recent PSA    Definition of BCR s/p RT reviewed with patient   Recommend continued close monitoring at this time   Nurse visit 3 months PSA*   FU 6 months with PSA      I am following the established plan for  Dr. Claiborne Billings and Dr. Norberto Sorenson is the supervising physician for this day.      Chief Complaint:      Chief Complaint       Patient presents with        ?  Prostate Cancer           HPI: Colton Cox  74 y.o. MALE who returns for follow-up of his prostate cancer cT1cNxM0 GS 4+3, Prostate cancer in 9/12 cores, prostate volume of  49.57 cm3, Pre-biopsy PSA= 3.75ng/mL s/p ADT and XRT completed 04/2016  and BPH s/p ProTouch 12/2015.      No complaints today.      Previous Hjstory   Patient underwent prostate biopsy on 12/12/15, which revealed GS 4+3 intermediate risk CaP in 9/12 cores. Bone scan on 12/22/15 negative for  osseous metastatic disease. He opted for radiation + ADT. He is s/p ProTouch laser enucleation of the prostate with  tissue morcellation on 01/14/2016. ADT initiated on 02/06/16 with Eligard 45 mg injection.   Completed XRT 03/18/16-04/23/16.         Past Medical History:        Diagnosis  Date         ?  Cerebral artery occlusion with cerebral infarction [facility])       ?  Colon polyps       ?  Hypertension           ?  Personal history of prostate cancer               Past Surgical History:         Procedure  Laterality  Date          ?  HX COLONOSCOPY    01/2018     ?  HX HERNIA REPAIR         ?  HX OTHER SURGICAL    06/2016          Cat Scan: spot noted on lung          ?  HX UROLOGICAL  N/A  01/14/2016          Cysto, Right RPG, and Protouch laser enucleation of the prostate with tissue morcellation - Dr. Claiborne Billings          ?  PR APPENDECTOMY                 Social History          Tobacco Use         ?  Smoking status:  Former Smoker     ?  Smokeless tobacco:  Former Systems developer              Quit date:  09/20/1988       Substance Use Topics         ?  Alcohol use:  Yes         ?  Drug use:  No             Allergies        Allergen  Reactions         ?  Bee Sting  [Sting, Bee]  Shortness of Breath             Family History         Problem  Relation  Age of Onset          ?  Colon Cancer  Mother            ?  Colon Cancer  Father                Current Outpatient Medications          Medication  Sig  Dispense  Refill           ?  tamsulosin (FLOMAX) 0.4 mg capsule  Take 1 Cap by mouth daily (after dinner).  90 Cap  3           ?  EPIPEN 2-PAK 0.3 mg/0.3 mL injection           ?  meloxicam (MOBIC) 15 mg tablet  Take 15 mg by mouth daily.         ?  clopidogrel (PLAVIX) 75 mg tab  Take  by mouth.         ?  multivitamin (ONE A DAY) tablet  Take 1 Tab by mouth daily.         ?  aspirin delayed-release 81 mg tablet  81 mg.         ?  simvastatin (ZOCOR) 20 mg tablet           ?  losartan-hydroCHLOROthiazide (HYZAAR) 50-12.5 mg per tablet  Take 1 Tab by Mouth Once a Day.         ?  ergocalciferol (ERGOCALCIFEROL) 50,000 unit capsule  Take 1 Cap by mouth every seven (7) days.  8 Cap  0     ?  ibuprofen (MOTRIN) 800 mg tablet           ?  calcium cit-vit D3-isoflavon 2 100-1,000-80 mg-unit-mg tab  Take  by mouth.               ?  cholecalciferol, vitamin D3, (VITAMIN D3) 2,000 unit tab  Take  by mouth.               Review of Systems:   Constitutional: Fever: No   Skin: Rash: No   HEENT: Hearing difficulty: No   Eyes: Blurred vision: No   Cardiovascular: Chest pain: No   Respiratory: Shortness of breath: No   Gastrointestinal: Nausea/vomiting: No   Musculoskeletal: Back pain: No   Neurological: Weakness: No   Psychological: Memory loss: No   Comments/additional findings:    All other systems reviewed and are negative.      Physical Exam:   Visit Vitals      Ht  5\' 11"  (1.803 m)     Wt  251 lb (113.9 kg)        BMI  35.01 kg/m??        Constitutional: WDWN, Pleasant and appropriate affect, No acute distress .     Respiratory: No respiratory distress or difficulties   Skin: No jaundice.     Neuro/Psych:  Alert and o riented. Affect appropriate.        Imaging:   NM Bone Scan [facility] Body 12/22/2015   IMPRESSION:  No bone scan evidence of osseous metastatic disease.       Surgical Pathology 01/14/2016   FINAL DIAGNOSIS:    PROSTATE, TRANSURETHRAL RESECTION:     BENIGN NODULAR HYPERPLASIA      Prostate Biopsy Pathology:       result  Gleason Grade  cores          12/12/2015  4+3= 7  POS:9  Total:12        Biopsy Pathology 12/12/2015   DIAGNOSIS:   A. Right Apex Needle biopsy ??   ?? ?? ?? ?? ?? ?? ??Benign prostatic tissue.   B. Right Mid Needle biopsy ??   ?? ?? ??  ?? ?? ?? ??Benign prostatic tissue.   C. Right Base Needle biopsy ??   ?? ?? ?? ?? ?? ?? ??ADENOCARCINOMA, GLEASON SCORE 3 + 3 = 6 involving 5 % of the specimen (1 of 1 core(s) positive).   ?? ?? ?? ?? ??  ?? ??Grade Group 1. Ends not involved by the tumor. Perineural invasion.   D. Right Lateral Apex Needle biopsy ??   ?? ?? ?? ?? ?? ?? ??Benign prostatic tissue.   E. Right Lateral Mid Needle biopsy ??   ?? ??  ?? ?? ?? ?? ??ADENOCARCINOMA, GLEASON SCORE 3 + 3 = 6 involving 10 % of the specimen (1 of 1 core(s) positive).   ?? ?? ?? ?? ?? ?? ??  Grade Group 1. Ends not involved by the tumor.   F.??Right Lateral Base Needle biopsy  ??   ?? ?? ?? ?? ?? ?? ??ADENOCARCINOMA, GLEASON SCORE 3 + 3 = 6 involving 5 % of the specimen (1 of 1 core(s) positive).   ?? ?? ?? ?? ?? ?? ??Grade Group 1. Ends not involved by the tumor.   G. Left Apex Needle  biopsy ??   ?? ?? ?? ?? ?? ?? ??ADENOCARCINOMA, GLEASON SCORE 4 + 3 = 7 involving 20 % of the specimen, discontinuous (1 of 1 core(s) positive).   ?? ?? ?? ?? ?? ?? ??Gleason 4 comprises 70 % of the cancer. Grade  Group 3. Ends not involved by the tumor.   H. Left Mid Needle biopsy ??   ?? ?? ?? ?? ?? ?? ??ADENOCARCINOMA, GLEASON SCORE 3 + 4 = 7 involving 60 % of the specimen, discontinuous (1 of 1 core(s) positive).   ?? ?? ??  ?? ?? ?? ??Gleason 4 comprises 20 % of the cancer. Grade Group 2. Tumor involves one end.   I. Left Base Needle biopsy ??   ?? ?? ?? ?? ?? ?? ??ADENOCARCINOMA, GLEASON SCORE 4 + 3 = 7 involving 80 % of the specimen, discontinuous  (1 of 1 core(s) positive).   ?? ?? ?? ?? ?? ?? ??Gleason 4 comprises 70 % of the cancer. Grade Group 3. Tumor involves one end. Perineural invasion.   J. Left Lateral Apex Needle biopsy ??   ?? ?? ?? ?? ?? ?? ??ADENOCARCINOMA,  GLEASON SCORE 3 + 4  = 7 involving 20 % of the specimen, discontinuous (1 of 1 core(s) positive).   ?? ?? ?? ?? ?? ?? ??Gleason 4 comprises 40 % of the cancer. Grade Group 2. Ends not involved by the tumor.   K. Left Lateral Mid Needle  biopsy ??   ?? ?? ?? ?? ?? ?? ??ADENOCARCINOMA, GLEASON SCORE 4 + 3 = 7 involving 60 % of the specimen (3 of 3 core(s) positive).   ?? ?? ?? ?? ?? ?? ??Gleason 4 comprises 70 % of the cancer. Grade Group 3. Tumor  involves one end. Perineural invasion.   L. Left Lateral Base Needle biopsy ??   ?? ?? ?? ?? ?? ?? ??ADENOCARCINOMA, GLEASON SCORE 4 + 3 = 7 involving 95 % of the specimen (1 of 1 core(s) positive).   ?? ?? ?? ?? ??  ?? ??Gleason 4 comprises 70 % of the cancer. Grade Group 3. Ends not involved by the tumor. Perineural invasion.       Labs reviewed today:    No results found for this or any previous visit (from the past 24 hour(s)).           PSA /TESTOSTERONE - [facility]  PSA  Testosterone, total     Latest Ref Rng & Units  0 - 4 ng/mL  348 - 1197 ng/dL         09/19/2019  1.39  270         06/21/2019  1.06        03/21/2019  0.66  208     12/27/2018  0.42       09/26/2018  0.25  187     07/20/2018  0.11  215     03/14/2018  0.11  184     10/05/2017  0.08  131     04/26/2017  0.04  51     01/25/2017  <0.03  10/26/2016  <0.03  <3 (L)     08/03/2016  <0.03  <3 (L)         11/03/2015  3.75           08/07/2015  5.40 (H)          Teodora Medici PA-C   [facility] [place]       CC:   Karna Christmas, [place]        *coded that test was ordered

## 2019-09-26 NOTE — Progress Notes (Signed)
Colton Cox  DOB [birth year]  74 y.o.    PROSTATE CANCER ESTABLISHED DIAGNOSIS    Encounter Diagnoses     ICD-10-CM ICD-9-CM   1. Prostate cancer ([place])  C61 185     Assessment:  1. 74 y.o. male with cT1cNxM0 GS 4+3, Prostate cancer in 9/12 cores, prostate volume of  49.57 cm3, Pre-biopsy PSA= 3.75ng/mL.     Bone scan on 12/22/15 negative for metastatic prostate cancer. CTU 2/17 negative   ADT initiated on 02/06/16 with Eligard 45 mg injection.  Single dose.   Completed XRT 03/18/16-04/23/16 with Dr. Tobe Sos    Most recent PSA: 0.66 ng/mL on 03/21/19, previously 0.25 ng/mL on 09/26/18.   No evidence recurrence on DRE 03/21/2019    2. 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 Mild worsening of symptoms with c/o weak stream and hesitancy. PVR 0 cc previously      3. History of microscopic hematuria, 1+ blood and 5-10 RBC by UA on 11/11/15.   Urine cytology 10/2015 showed rare atypical urothelial cells.    CT Urogram 11/21/15 showed slightly attenuated and perhaps slightly thickened mid-distal right ureter/equivocally strictured, but without proximal dilatation. Rretrograde ureterography in OR 4/26 w/o evidence of filling defect   No gross hematuria    4. 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 Vit D 18.6 on 03/14/18    4. History of TIA on Plavix 75 mg and aspirin 81 mg daily  5. History of tobacco use    Plan:  Reviewed mst recent PSA   Definition of BCR s/p RT reviewed with patient  Recommend continued close monitoring at this time  Nurse visit 3 months PSA*  FU 6 months with PSA    I am following the established plan for Dr. Claiborne Billings and Dr. Norberto Sorenson is the supervising physician for this day.    Chief Complaint:   Chief Complaint   Patient presents with   ??? Prostate Cancer        HPI: Colton Cox 74 y.o. MALE who returns for follow-up of his prostate cancer cT1cNxM0 GS 4+3, Prostate cancer in 9/12 cores, prostate volume of  49.57 cm3, Pre-biopsy PSA= 3.75ng/mL s/p ADT and XRT completed 04/2016 and BPH s/p ProTouch 12/2015.    No complaints today.    Previous Hjstory  Patient underwent prostate biopsy on 12/12/15, which revealed GS 4+3 intermediate risk CaP in 9/12 cores. Bone scan on 12/22/15 negative for osseous metastatic disease. He opted for radiation + ADT. He is s/p ProTouch laser enucleation of the prostate with tissue morcellation on 01/14/2016. ADT initiated on 02/06/16 with Eligard 45 mg injection.  Completed XRT 03/18/16-04/23/16.     Past Medical History:   Diagnosis Date   ??? Cerebral artery occlusion with cerebral infarction [facility])    ??? Colon polyps    ??? Hypertension    ??? Personal history of prostate cancer        Past Surgical History:   Procedure Laterality Date   ??? HX COLONOSCOPY  01/2018   ??? HX HERNIA REPAIR     ??? HX OTHER SURGICAL  06/2016    Cat Scan: spot noted on lung   ??? HX UROLOGICAL N/A 01/14/2016    Cysto, Right RPG, and Protouch laser enucleation of the prostate with tissue morcellation - Dr. Claiborne Billings   ??? PR APPENDECTOMY         Social History     Tobacco Use   ???  Smoking status: Former Smoker   ??? Smokeless tobacco: Former Systems developer     Quit date: 09/20/1988   Substance Use Topics   ??? Alcohol use: Yes   ??? Drug use: No       Allergies   Allergen Reactions   ??? Bee Sting  [Sting, Bee] Shortness of Breath       Family History   Problem Relation Age of Onset   ??? Colon Cancer Mother    ??? Colon Cancer Father        Current Outpatient Medications   Medication Sig Dispense Refill   ??? tamsulosin (FLOMAX) 0.4 mg capsule Take 1 Cap by mouth daily (after dinner). 90 Cap 3   ??? EPIPEN 2-PAK 0.3 mg/0.3 mL injection      ??? meloxicam (MOBIC) 15 mg tablet Take 15 mg by mouth daily.     ??? clopidogrel (PLAVIX) 75 mg tab Take  by mouth.     ??? multivitamin (ONE A DAY) tablet Take 1 Tab by mouth daily.      ??? aspirin delayed-release 81 mg tablet 81 mg.     ??? simvastatin (ZOCOR) 20 mg tablet      ??? losartan-hydroCHLOROthiazide (HYZAAR) 50-12.5 mg per tablet Take 1 Tab by Mouth Once a Day.     ??? ergocalciferol (ERGOCALCIFEROL) 50,000 unit capsule Take 1 Cap by mouth every seven (7) days. 8 Cap 0   ??? ibuprofen (MOTRIN) 800 mg tablet      ??? calcium cit-vit D3-isoflavon 2 100-1,000-80 mg-unit-mg tab Take  by mouth.     ??? cholecalciferol, vitamin D3, (VITAMIN D3) 2,000 unit tab Take  by mouth.         Review of Systems:  Constitutional: Fever: No  Skin: Rash: No  HEENT: Hearing difficulty: No  Eyes: Blurred vision: No  Cardiovascular: Chest pain: No  Respiratory: Shortness of breath: No  Gastrointestinal: Nausea/vomiting: No  Musculoskeletal: Back pain: No  Neurological: Weakness: No  Psychological: Memory loss: No  Comments/additional findings:   All other systems reviewed and are negative.    Physical Exam:  Visit Vitals  Ht 5\' 11"  (1.803 m)   Wt 251 lb (113.9 kg)   BMI 35.01 kg/m??     Constitutional: WDWN, Pleasant and appropriate affect, No acute distress.    Respiratory: No respiratory distress or difficulties  Skin: No jaundice.    Neuro/Psych:  Alert and oriented. Affect appropriate.      Imaging:  NM Bone Scan [facility] Body 12/22/2015  IMPRESSION:  No bone scan evidence of osseous metastatic disease.     Surgical Pathology 01/14/2016  FINAL DIAGNOSIS:   PROSTATE, TRANSURETHRAL RESECTION:   BENIGN NODULAR HYPERPLASIA    Prostate Biopsy Pathology:  result Gleason Grade cores   12/12/2015 4+3= 7 POS:9 Total:12     Biopsy Pathology 12/12/2015  DIAGNOSIS:   A. Right Apex Needle biopsy ??   ?? ?? ?? ?? ?? ?? ??Benign prostatic tissue.   B. Right Mid Needle biopsy ??   ?? ?? ?? ?? ?? ?? ??Benign prostatic tissue.   C. Right Base Needle biopsy ??   ?? ?? ?? ?? ?? ?? ??ADENOCARCINOMA, GLEASON SCORE 3 + 3 = 6 involving 5 % of the specimen (1 of 1 core(s) positive).   ?? ?? ?? ?? ?? ?? ??Grade Group 1. Ends not involved by the tumor. Perineural invasion.    D. Right Lateral Apex Needle biopsy ??   ?? ?? ?? ?? ?? ?? ??Benign prostatic tissue.  E. Right Lateral Mid Needle biopsy ??   ?? ?? ?? ?? ?? ?? ??ADENOCARCINOMA, GLEASON SCORE 3 + 3 = 6 involving 10 % of the specimen (1 of 1 core(s) positive).   ?? ?? ?? ?? ?? ?? ??Grade Group 1. Ends not involved by the tumor.   F.??Right Lateral Base Needle biopsy ??   ?? ?? ?? ?? ?? ?? ??ADENOCARCINOMA, GLEASON SCORE 3 + 3 = 6 involving 5 % of the specimen (1 of 1 core(s) positive).   ?? ?? ?? ?? ?? ?? ??Grade Group 1. Ends not involved by the tumor.   G. Left Apex Needle biopsy ??   ?? ?? ?? ?? ?? ?? ??ADENOCARCINOMA, GLEASON SCORE 4 + 3 = 7 involving 20 % of the specimen, discontinuous (1 of 1 core(s) positive).   ?? ?? ?? ?? ?? ?? ??Gleason 4 comprises 70 % of the cancer. Grade Group 3. Ends not involved by the tumor.   H. Left Mid Needle biopsy ??   ?? ?? ?? ?? ?? ?? ??ADENOCARCINOMA, GLEASON SCORE 3 + 4 = 7 involving 60 % of the specimen, discontinuous (1 of 1 core(s) positive).   ?? ?? ?? ?? ?? ?? ??Gleason 4 comprises 20 % of the cancer. Grade Group 2. Tumor involves one end.   I. Left Base Needle biopsy ??   ?? ?? ?? ?? ?? ?? ??ADENOCARCINOMA, GLEASON SCORE 4 + 3 = 7 involving 80 % of the specimen, discontinuous (1 of 1 core(s) positive).   ?? ?? ?? ?? ?? ?? ??Gleason 4 comprises 70 % of the cancer. Grade Group 3. Tumor involves one end. Perineural invasion.   J. Left Lateral Apex Needle biopsy ??   ?? ?? ?? ?? ?? ?? ??ADENOCARCINOMA, GLEASON SCORE 3 + 4 = 7 involving 20 % of the specimen, discontinuous (1 of 1 core(s) positive).   ?? ?? ?? ?? ?? ?? ??Gleason 4 comprises 40 % of the cancer. Grade Group 2. Ends not involved by the tumor.   K. Left Lateral Mid Needle biopsy ??   ?? ?? ?? ?? ?? ?? ??ADENOCARCINOMA, GLEASON SCORE 4 + 3 = 7 involving 60 % of the specimen (3 of 3 core(s) positive).   ?? ?? ?? ?? ?? ?? ??Gleason 4 comprises 70 % of the cancer. Grade Group 3. Tumor involves one end. Perineural invasion.   L. Left Lateral Base Needle biopsy ??    ?? ?? ?? ?? ?? ?? ??ADENOCARCINOMA, GLEASON SCORE 4 + 3 = 7 involving 95 % of the specimen (1 of 1 core(s) positive).   ?? ?? ?? ?? ?? ?? ??Gleason 4 comprises 70 % of the cancer. Grade Group 3. Ends not involved by the tumor. Perineural invasion.     Labs reviewed today:   No results found for this or any previous visit (from the past 24 hour(s)).     PSA /TESTOSTERONE - BSHSI PSA Testosterone, total   Latest Ref Rng & Units 0 - 4 ng/mL 348 - 1197 ng/dL   09/19/2019 1.39 270   06/21/2019 1.06     03/21/2019 0.66 208   12/27/2018 0.42    09/26/2018 0.25 187   07/20/2018 0.11 215   03/14/2018 0.11 184   10/05/2017 0.08 131   04/26/2017 0.04 51   01/25/2017 <0.03    10/26/2016 <0.03 <3 (L)   08/03/2016 <0.03 <3 (L)   11/03/2015 3.75    08/07/2015 5.40 (H)      Easter Kennebrew  PA-C  [facility] [place]     CC:  Karna Christmas, [place]      *coded that test was ordered

## 2019-12-20 ENCOUNTER — Institutional Professional Consult (permissible substitution): Admit: 2019-12-20 | Discharge: 2019-12-20 | Primary: Medical

## 2019-12-20 DIAGNOSIS — C61 Malignant neoplasm of prostate: Secondary | ICD-10-CM

## 2019-12-20 NOTE — Progress Notes (Signed)
 Colton Cox presents today for lab draw per Dr. Bertha order.   Dr. Ceclia was present in the clinic as incident to.     PSA obtained via venipuncture without any difficulty.    Patient will be notified with lab results.       Orders Placed This Encounter   . PROSTATE SPECIFIC ANTIGEN, TOTAL (PSA)   . COLLECTION VENOUS BLOOD,VENIPUNCTURE       Colton Cox

## 2019-12-20 NOTE — Progress Notes (Signed)
Noted. Forwarded to patient on mychart

## 2019-12-20 NOTE — Progress Notes (Signed)
Colton Cox presents today for lab draw per Dr. Oris Drone order.   Dr. Harvel Quale was present in the clinic as incident to.     PSA obtained via venipuncture without any difficulty.    Patient will be notified with lab results.       Orders Placed This Encounter   ??? PROSTATE SPECIFIC ANTIGEN, TOTAL (PSA)   ??? COLLECTION VENOUS [place]

## 2019-12-21 LAB — PROSTATE SPECIFIC ANTIGEN, TOTAL (PSA)
PSA: 0.99 ng/mL (ref 0.00–4.00)
Prostate Specific Ag: 0.99 ng/mL (ref 0.00–4.00)

## 2020-03-13 ENCOUNTER — Institutional Professional Consult (permissible substitution): Admit: 2020-03-13 | Discharge: 2020-03-13 | Primary: Medical

## 2020-03-13 DIAGNOSIS — C61 Malignant neoplasm of prostate: Secondary | ICD-10-CM

## 2020-03-13 NOTE — Progress Notes (Signed)
 Colton Cox presents today for lab draw per Dr. Bertha order.   Dr. Ceclia was present in the clinic as incident to.     PSA obtained via venipuncture without any difficulty.    Patient will be notified with lab results.       Orders Placed This Encounter   . PROSTATE SPECIFIC ANTIGEN, TOTAL (PSA)   . COLLECTION VENOUS BLOOD,VENIPUNCTURE       [name]

## 2020-03-14 LAB — PROSTATE SPECIFIC ANTIGEN, TOTAL (PSA)
PSA: 1.16 ng/mL (ref 0.00–4.00)
Prostate Specific Ag: 1.16 ng/mL (ref 0.00–4.00)

## 2020-03-14 MED ORDER — TAMSULOSIN SR 0.4 MG 24 HR CAP
0.4 mg | ORAL_CAPSULE | Freq: Every day | ORAL | 3 refills | Status: DC
Start: 2020-03-14 — End: 2020-11-03

## 2020-03-14 NOTE — Telephone Encounter (Signed)
-----   Message from Domenica Reamer sent at 03/14/2020  3:19 PM EDT -----  Regarding: FW: Prescription Question  Contact: [phone]    ----- Message -----  From: [name]  Sent: 03/14/2020   3:04 PM EDT  To: [facility]  Subject: Prescription Question                            Can you send a new prescription to [facility] for my Tamsulosin .4MG  cap. I have 4 left. my next appointment with Urology is next Friday.    Thanks   SAUNDERS ARLINGTON [phone]     [birth year]

## 2020-03-20 ENCOUNTER — Ambulatory Visit: Admit: 2020-03-20 | Discharge: 2020-03-20 | Attending: Urology | Primary: Medical

## 2020-03-20 ENCOUNTER — Ambulatory Visit: Attending: Urology | Primary: Medical

## 2020-03-20 DIAGNOSIS — C61 Malignant neoplasm of prostate: Secondary | ICD-10-CM

## 2020-03-20 NOTE — Progress Notes (Signed)
Progress Notes by Teodora Medici, PA-C at 03/20/20 1330                Author: Teodora Medici, PA-C  Service: --  Author Type: Physician Assistant       Filed: 03/20/20 1337  Encounter Date: 03/20/2020  Status: Signed          Editor: Charyl Dancer (Physician Assistant)                       Colton Cox   DOB 12-Aug-1946   74 y.o.      PROSTATE CANCER ESTABLISHED DIAGNOSIS        Encounter Diagnoses              ICD-10-CM  ICD-9-CM          1.  Prostate cancer ([place])   C61  185     2.  BPH with obstruction/lower urinary tract symptoms   N40.1  600.01           N13.8  599.69        Assessment:   1. 74 y.o. male with cT1cNxM0 GS 4+3, Prostate cancer in 9/12 cores, prostate volume of   49.57 cm3, Pre-biopsy PSA= 3.75ng/mL.      Bone scan on 12/22/15 negative for metastatic prostate cancer. CTU 2/17 negative    ADT initiated on 02/06/16 with Eligard 45 mg injection.  Single dose.    Completed XRT 03/18/16-04/23/16 with Dr. Tobe Sos     No evidence recurrence on DRE 03/21/2019    Most recent PSA: 1.16 ng/mL on 03/13/20, previously 1.39 ng/ml (08/2019)      2. 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 Back on flomax.        3. History of microscopic hematuria, 1+ blood and 5-10 RBC by UA on 11/11/15.    Urine cytology 10/2015 showed rare atypical urothelial cells.     CT Urogram 11/21/15 showed slightly attenuated and perhaps slightly thickened mid-distal right ureter/equivocally strictured, but without proximal dilatation. Rretrograde ureterography in OR 4/26 w/o evidence of filling defect    No gross hematuria      4. 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 Vit D 18.6 on 03/14/18      4. History of TIA on Plavix 75 mg and aspirin 81 mg daily   5. History of tobacco use      Plan:   Reviewed most recent PSAs with patient and PSA trend   Continue regular PSA monitoring at this time   Continue flomax 0.4 mg as prescribed   Nurse visit 6 months PSA   FU 1 year with PSA      I  am following the established plan for Dr. Claiborne Billings and Dr. Norberto Sorenson is the supervising physician for this day.      Chief Complaint:      Chief Complaint       Patient presents with        ?  Prostate Cancer           HPI: Colton Cox  74 y.o. MALE who returns for follow-up of his prostate cancer cT1cNxM0 GS 4+3, Prostate cancer in 9/12 cores, prostate volume of  49.57 cm3, Pre-biopsy PSA= 3.75ng/mL s/p ADT and XRT completed 04/2016  and BPH s/p ProTouch 12/2015.      No complaints today. Continues on flomax with decent results. Nocturia 2x nightly. Daytime frequency  q 2 hours. No issues with bladder emptying.      Previous Hjstory   Patient underwent prostate biopsy on 12/12/15, which revealed GS 4+3 intermediate risk CaP in 9/12 cores. Bone scan on 12/22/15 negative for osseous metastatic disease. He opted for radiation + ADT. He is s/p ProTouch laser enucleation of the prostate with  tissue morcellation on 01/14/2016. ADT initiated on 02/06/16 with Eligard 45 mg injection.   Completed XRT 03/18/16-04/23/16.         Past Medical History:        Diagnosis  Date         ?  Cerebral artery occlusion with cerebral infarction [facility])       ?  Colon polyps       ?  Hypertension           ?  Personal history of prostate cancer               Past Surgical History:         Procedure  Laterality  Date          ?  HX COLONOSCOPY    01/2018     ?  HX HERNIA REPAIR         ?  HX OTHER SURGICAL    06/2016          Cat Scan: spot noted on lung          ?  HX UROLOGICAL  N/A  01/14/2016          Cysto, Right RPG, and Protouch laser enucleation of the prostate with tissue morcellation - Dr. Claiborne Billings          ?  PR APPENDECTOMY                 Social History          Tobacco Use         ?  Smoking status:  Former Smoker     ?  Smokeless tobacco:  Former Systems developer              Quit date:  09/20/1988       Substance Use Topics         ?  Alcohol use:  Yes         ?  Drug use:  No             Allergies        Allergen  Reactions         ?  Bee Sting  [Sting,  Bee]  Shortness of Breath             Family History         Problem  Relation  Age of Onset          ?  Colon Cancer  Mother            ?  Colon Cancer  Father               Current Outpatient Medications          Medication  Sig  Dispense  Refill           ?  losartan (COZAAR) 50 mg tablet           ?  hydroCHLOROthiazide (MICROZIDE) 12.5 mg capsule           ?  tamsulosin (FLOMAX) 0.4 mg capsule  Take 1 Capsule by mouth daily (after dinner).  90 Capsule  3     ?  EPIPEN 2-PAK 0.3 mg/0.3 mL injection           ?  ibuprofen (MOTRIN) 800 mg tablet           ?  meloxicam (MOBIC) 15 mg tablet  Take 15 mg by mouth daily.         ?  clopidogrel (PLAVIX) 75 mg tab  Take  by mouth.         ?  multivitamin (ONE A DAY) tablet  Take 1 Tab by mouth daily.         ?  simvastatin (ZOCOR) 20 mg tablet           ?  ergocalciferol (ERGOCALCIFEROL) 50,000 unit capsule  Take 1 Cap by mouth every seven (7) days.  8 Cap  0     ?  calcium cit-vit D3-isoflavon 2 100-1,000-80 mg-unit-mg tab  Take  by mouth.               ?  cholecalciferol, vitamin D3, (VITAMIN D3) 2,000 unit tab  Take  by mouth.               Review of Systems:   Constitutional: Fever: No   Skin: Rash: No   HEENT: Hearing difficulty: Yes   bilateral    Eyes: Blurred vision: No   Cardiovascular: Chest pain: No   Respiratory: Shortness of breath: No   Gastrointestinal: Nausea/vomiting: No   Musculoskeletal: Back pain: No   Neurological: Weakness: No   Psychological: Memory loss: No   Comments/additional findings:    All other systems reviewed and are negative.      Physical Exam:   Visit Vitals      Ht  5\' 11"  (1.803 m)     Wt  250 lb (113.4 kg)        BMI  34.87 kg/m??        Constitutional: WDWN, Pleasant and appropriate affect, No acute distress .     Respiratory: No respiratory distress or difficulties   Skin: No jaundice.     Neuro/Psych:  Alert and o riented. Affect appropriate.        Imaging:   NM Bone Scan [facility] Body 12/22/2015   IMPRESSION:  No bone scan evidence of  osseous metastatic disease.       Surgical Pathology 01/14/2016   FINAL DIAGNOSIS:    PROSTATE, TRANSURETHRAL RESECTION:    BENIGN NODULAR HYPERPLASIA      Prostate Biopsy Pathology:       result  Gleason Grade  cores          12/12/2015  4+3= 7  POS:9  Total:12        Biopsy Pathology 12/12/2015   DIAGNOSIS:   A. Right Apex Needle biopsy ??   ?? ?? ?? ?? ?? ?? ??Benign prostatic tissue.   B. Right Mid Needle biopsy ??   ?? ?? ??  ?? ?? ?? ??Benign prostatic tissue.   C. Right Base Needle biopsy ??   ?? ?? ?? ?? ?? ?? ??ADENOCARCINOMA, GLEASON SCORE 3 + 3 = 6 involving 5 % of the specimen (1 of 1 core(s) positive).   ?? ?? ?? ?? ??  ?? ??Grade Group 1. Ends not involved by the tumor. Perineural invasion.   D. Right Lateral Apex Needle biopsy ??   ?? ?? ?? ?? ?? ?? ??Benign prostatic tissue.   E. Right Lateral Mid Needle biopsy ??   ?? ??  ?? ?? ?? ?? ??  ADENOCARCINOMA, GLEASON SCORE 3 + 3 = 6 involving 10 % of the specimen (1 of 1 core(s) positive).   ?? ?? ?? ?? ?? ?? ??Grade Group 1. Ends not involved by the tumor.   F.??Right Lateral Base Needle biopsy  ??   ?? ?? ?? ?? ?? ?? ??ADENOCARCINOMA, GLEASON SCORE 3 + 3 = 6 involving 5 % of the specimen (1 of 1 core(s) positive).   ?? ?? ?? ?? ?? ?? ??Grade Group 1. Ends not involved by the tumor.   G. Left Apex Needle  biopsy ??   ?? ?? ?? ?? ?? ?? ??ADENOCARCINOMA, GLEASON SCORE 4 + 3 = 7 involving 20 % of the specimen, discontinuous (1 of 1 core(s) positive).   ?? ?? ?? ?? ?? ?? ??Gleason 4 comprises 70 % of the cancer. Grade  Group 3. Ends not involved by the tumor.   H. Left Mid Needle biopsy ??   ?? ?? ?? ?? ?? ?? ??ADENOCARCINOMA, GLEASON SCORE 3 + 4 = 7 involving 60 % of the specimen, discontinuous (1 of 1 core(s) positive).   ?? ?? ??  ?? ?? ?? ??Gleason 4 comprises 20 % of the cancer. Grade Group 2. Tumor involves one end.   I. Left Base Needle biopsy ??   ?? ?? ?? ?? ?? ?? ??ADENOCARCINOMA, GLEASON SCORE 4 + 3 = 7 involving 80 % of the specimen, discontinuous  (1 of 1 core(s) positive).   ?? ?? ?? ?? ?? ?? ??Gleason 4 comprises 70 % of the cancer. Grade Group 3. Tumor  involves one end. Perineural invasion.   J. Left Lateral Apex Needle biopsy ??   ?? ?? ?? ?? ?? ?? ??ADENOCARCINOMA,  GLEASON SCORE 3 + 4 = 7 involving 20 % of the specimen, discontinuous (1 of 1 core(s) positive).   ?? ?? ?? ?? ?? ?? ??Gleason 4 comprises 40 % of the cancer. Grade Group 2. Ends not involved by the tumor.   K. Left Lateral Mid Needle  biopsy ??   ?? ?? ?? ?? ?? ?? ??ADENOCARCINOMA, GLEASON SCORE 4 + 3 = 7 involving 60 % of the specimen (3 of 3 core(s) positive).   ?? ?? ?? ?? ?? ?? ??Gleason 4 comprises 70 % of the cancer. Grade Group 3. Tumor  involves one end. Perineural invasion.   L. Left Lateral Base Needle biopsy ??   ?? ?? ?? ?? ?? ?? ??ADENOCARCINOMA, GLEASON SCORE 4 + 3 = 7 involving 95 % of the specimen (1 of 1 core(s) positive).   ?? ?? ?? ?? ??  ?? ??Gleason 4 comprises 70 % of the cancer. Grade Group 3. Ends not involved by the tumor. Perineural invasion.       Labs reviewed today:    No results found for this or any previous visit (from the past 24 hour(s)).           PSA /TESTOSTERONE - [facility]SI  PSA  Testosterone, total     Latest Ref Rng & Units  0 - 4 ng/mL  348 - 1197 ng/dL         03/13/2020  1.16       12/20/2019  0.99       09/19/2019  1.39  270         06/21/2019  1.06        03/21/2019  0.66  208     12/27/2018  0.42       09/26/2018  0.25  187     07/20/2018  0.11  215     03/14/2018  0.11  184     10/05/2017  0.08  131     04/26/2017  0.04  51     01/25/2017  <0.03           10/26/2016  <0.03  <3 (L)     08/03/2016  <0.03  <3 (L)         11/03/2015  3.75           08/07/2015  5.40 (H)          Teodora Medici PA-C   [facility] [place]       CC:   Karna Christmas, [place]

## 2020-05-30 NOTE — Telephone Encounter (Signed)
Pharmacist at [facility] was advised, per our provider Teodora Medici, pt may hold tamsulosin to prevent drug interaction with itroconazole.      Pt is aware to call the office for difficulty voiding.

## 2020-09-26 ENCOUNTER — Institutional Professional Consult (permissible substitution): Admit: 2020-09-26 | Discharge: 2020-09-26 | Primary: Medical

## 2020-09-26 DIAGNOSIS — C61 Malignant neoplasm of prostate: Secondary | ICD-10-CM

## 2020-09-26 LAB — PROSTATE SPECIFIC ANTIGEN, TOTAL (PSA)
PSA: 1.49 ng/mL (ref 0.00–4.00)
Prostate Specific Ag: 1.49 ng/mL (ref 0.00–4.00)

## 2020-09-26 NOTE — Progress Notes (Signed)
 Colton Cox presents today for lab draw per [name] order.   Dr. Arsenio was present in the clinic as incident to.     PSA obtained via venipuncture without any difficulty.    Patient will be notified with lab results.       Orders Placed This Encounter   . PROSTATE SPECIFIC ANTIGEN, TOTAL (PSA)   . COLLECTION VENOUS BLOOD,VENIPUNCTURE       Jade M. Hill, CMA

## 2020-11-03 MED ORDER — TAMSULOSIN SR 0.4 MG 24 HR CAP
0.4 mg | ORAL_CAPSULE | Freq: Every day | ORAL | 3 refills | Status: DC
Start: 2020-11-03 — End: 2021-06-17

## 2020-11-03 NOTE — Telephone Encounter (Signed)
-----   Message from Domenica Reamer sent at 11/03/2020 11:29 AM EST -----  Regarding: FW: New Prescription request    ----- Message -----  From: [name]  Sent: 11/03/2020  10:13 AM EST  To: [facility]  Subject: New Prescription request                         Hi Colton Cox stopped taking [facility] and I need to switch back to [facility].   Can you send a new prescription for Tamsolution Hcl to them.    Thanks  [name]  [birth year]

## 2021-03-20 ENCOUNTER — Encounter: Primary: Medical

## 2021-03-27 ENCOUNTER — Encounter: Attending: Physician Assistant | Primary: Medical

## 2021-04-13 ENCOUNTER — Institutional Professional Consult (permissible substitution): Admit: 2021-04-13 | Discharge: 2021-04-13 | Primary: Medical

## 2021-04-13 DIAGNOSIS — C61 Malignant neoplasm of prostate: Secondary | ICD-10-CM

## 2021-04-13 LAB — TESTOSTERONE
Testosterone: 187 ng/dL — ABNORMAL LOW (ref 348–1197)
Testosterone: 187 ng/dL — ABNORMAL LOW (ref 348–1197)

## 2021-04-13 LAB — PROSTATE SPECIFIC ANTIGEN, TOTAL (PSA)
PSA: 2.16 ng/mL (ref 0.00–4.00)
Prostate Specific Ag: 2.16 ng/mL (ref 0.00–4.00)

## 2021-04-13 NOTE — Progress Notes (Signed)
Results reviewed.  No urgent issues.  Forward results to patient.  Follow up as scheduled for further review and discussion.    Pankaj Haack B. Korynn Kenedy, MD  [facility] 

## 2021-04-13 NOTE — Progress Notes (Signed)
Colton Cox presents today for lab draw per Dr. Norberto Sorenson order.   Dr. Elonda Husky was present in the clinic as incident to.     PSA,TST obtained via venipuncture without any difficulty.    Patient will be notified with lab results.       Orders Placed This Encounter    TESTOSTERONE    PROSTATE SPECIFIC ANTIGEN, TOTAL (PSA)    COLLECTION VENOUS BLOOD,VENIPUNCTURE       [name]

## 2021-04-21 ENCOUNTER — Ambulatory Visit: Admit: 2021-04-21 | Discharge: 2021-04-21 | Attending: Physician Assistant | Primary: Medical

## 2021-04-21 ENCOUNTER — Ambulatory Visit: Attending: Physician Assistant | Primary: Medical

## 2021-04-21 DIAGNOSIS — C61 Malignant neoplasm of prostate: Secondary | ICD-10-CM

## 2021-04-21 NOTE — Progress Notes (Signed)
Sch 05/22/2021 2:45 PM PET/CT [facility] PYLARIFY PROSTATE 45 min [facility] MOBILE PET CT [facility] SS/PEN MOBILE PET CT

## 2021-04-21 NOTE — Progress Notes (Signed)
Faxed imaging to [facility]  [phone]; 05/21/21

## 2021-04-21 NOTE — Progress Notes (Signed)
Colton Cox has an order for PET/CT TUMOR IMAGE SKULL THIGH W    ALL MALE PATIENTS NEEDING AN MRI OF PELVIS OR PROSTATE, MUST BE SCHEDULED AT ONE OF THE FOLLOWING:    **MRI/CT (Preferred [facility])  **[facility] [place] (Preferred [facility])  **[place] (Preferred [facility])  [facility]  [place]      [facility]     Needed by:  05/21/2021    Patient has a follow-up appointment:  06/17/2021    If MRI, does patient have a pacemaker:   no    Order has been placed in connect care:  yes    Is this a STAT order:  no      Luretha Murphy

## 2021-04-21 NOTE — Progress Notes (Signed)
Progress Notes by Teodora Medici, PA-C at 04/21/21 0840                Author: Teodora Medici, PA-C  Service: --  Author Type: Physician Assistant       Filed: 04/21/21 0901  Encounter Date: 04/21/2021  Status: Signed          Editor: Charyl Dancer (Physician Assistant)                       Colton Cox   DOB 1945-11-01   75 y.o.      PROSTATE CANCER ESTABLISHED DIAGNOSIS        Encounter Diagnoses              ICD-10-CM  ICD-9-CM          1.  Prostate cancer ([place])   C61  185     2.  BPH with obstruction/lower urinary tract symptoms   N40.1  600.01           N13.8  599.69           Assessment:   1. 75 y.o. male with cT1cNxM0 GS 4+3, Prostate cancer in 9/12 cores, prostate volume of   49.57 cm3, Pre-biopsy PSA= 3.75ng/mL.      Bone scan on 12/22/15 negative for metastatic prostate cancer. CTU 2/17 negative    ADT initiated on 02/06/16 with Eligard 45 mg injection.  Single dose.    Completed XRT 03/18/16-04/23/16 with Dr. Tobe Sos     PSA nadir: 0.11 ng/ml (06/2018) with PSA back to baseline    No evidence recurrence on DRE 03/21/2019    Most recent PSA: 2.16 ng/mL on 04/13/2021      2. 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 Back on flomax with worsening nocturia        3. History of microscopic hematuria, 1+ blood and 5-10 RBC by UA on 11/11/15.    Urine cytology 10/2015 showed rare atypical urothelial cells.     CT Urogram 11/21/15 showed slightly attenuated and perhaps slightly thickened mid-distal right ureter/equivocally strictured, but without proximal dilatation. Rretrograde ureterography in OR 4/26 w/o evidence of filling defect    No gross hematuria      4. 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 Vit D 18.6 on 03/14/18      4. History of TIA on Plavix 75 mg and aspirin 81 mg daily   5. History of tobacco use      Plan:   Reviewed most recent PSAs with patient and PSA trend   Definition of BCR s/p RT reviewed with patient--patient currently meets definition  based upon [facility] criteria   Discussed continued PSA monitoring vs further eval with PSMA PET   Patient elects to proceed with PSMA PET, scan ordered   Trial flomax 0.8 mg with dosing instructions reviewed   Follow-up 6-8 weeks to review PSMA PET and reassess LUTS with PVR on arrival         Chief Complaint:      Chief Complaint       Patient presents with        ?  Prostate Cancer     ?  Benign Prostatic Hypertrophy        ?  (LUTS) Lower Urinary Tract Symptoms           HPI: Colton Cox  75 y.o. MALE who returns for follow-up  of his prostate cancer cT1cNxM0 GS 4+3, Prostate cancer in 9/12 cores, prostate volume of  49.57 cm3, Pre-biopsy PSA= 3.75ng/mL s/p ADT and XRT completed 04/2016  and BPH s/p ProTouch 12/2015.      Continues on flomax with decent results. Nocturia 2x nightly. Daytime frequency q 2 hours. No issues with bladder emptying. Feels like his symptoms are slowly worsening. Things were much improved s/p ProLEP. No gross hematuria or dysuria.      Previous Hjstory   Patient underwent prostate biopsy on 12/12/15, which revealed GS 4+3 intermediate risk CaP in 9/12 cores. Bone scan on 12/22/15 negative for osseous metastatic disease. He opted for radiation + ADT. He  is s/p ProTouch laser enucleation of the prostate with tissue morcellation on 01/14/2016. ADT initiated on 02/06/16 with Eligard 45 mg injection.   Completed XRT 03/18/16-04/23/16.         Past Medical History:        Diagnosis  Date         ?  Cerebral artery occlusion with cerebral infarction [facility])       ?  Colon polyps       ?  Hypertension           ?  Personal history of prostate cancer               Past Surgical History:         Procedure  Laterality  Date          ?  HX COLONOSCOPY    01/2018     ?  HX HERNIA REPAIR         ?  HX OTHER SURGICAL    06/2016          Cat Scan: spot noted on lung          ?  HX UROLOGICAL  N/A  01/14/2016          Cysto, Right RPG, and Protouch laser enucleation of the prostate with tissue morcellation - Dr.  Claiborne Billings          ?  PR APPENDECTOMY                 Social History          Tobacco Use         ?  Smoking status:  Former     ?  Smokeless tobacco:  Former              Quit date:  09/20/1988       Substance Use Topics         ?  Alcohol use:  Yes         ?  Drug use:  No             Allergies        Allergen  Reactions         ?  Bee Sting  [Sting, Bee]  Shortness of Breath             Family History         Problem  Relation  Age of Onset          ?  Colon Cancer  Mother            ?  Colon Cancer  Father               Current Outpatient Medications          Medication  Sig  Dispense  Refill           ?  multivitamins-minerals-lutein (Multivitamin 50 Plus) tab tablet           ?  tamsulosin (FLOMAX) 0.4 mg capsule  Take 1 Capsule by mouth daily (after dinner).  90 Capsule  3     ?  losartan (COZAAR) 50 mg tablet           ?  hydroCHLOROthiazide (MICROZIDE) 12.5 mg capsule           ?  EPIPEN 2-PAK 0.3 mg/0.3 mL injection           ?  meloxicam (MOBIC) 15 mg tablet  Take 15 mg by mouth daily.         ?  clopidogreL (PLAVIX) 75 mg tab  Take  by mouth.         ?  simvastatin (ZOCOR) 20 mg tablet           ?  ergocalciferol (ERGOCALCIFEROL) 50,000 unit capsule  Take 1 Cap by mouth every seven (7) days. (Patient not taking: Reported on 04/21/2021)  8 Cap  0     ?  ibuprofen (MOTRIN) 800 mg tablet   (Patient not taking: Reported on 04/21/2021)         ?  calcium cit-vit D3-isoflavon 2 100-1,000-80 mg-unit-mg tab  Take  by mouth. (Patient not taking: Reported on 04/21/2021)         ?  cholecalciferol, vitamin D3, 50 mcg (2,000 unit) tab  Take  by mouth. (Patient not taking: Reported on 04/21/2021)               ?  multivitamin (ONE A DAY) tablet  Take 1 Tab by mouth daily. (Patient not taking: Reported on 04/21/2021)               Review of Systems:   Constitutional: Fever: No   Skin: Rash: No   HEENT: Hearing difficulty: No   Eyes: Blurred vision: No   Cardiovascular: Chest pain: No   Respiratory: Shortness of breath: No    Gastrointestinal: Nausea/vomiting: No   Musculoskeletal: Back pain: No   Neurological: Weakness: No   Psychological: Memory loss: No   Comments/additional findings:    All other systems reviewed and are negative.      Physical Exam:   Visit Vitals      Ht  '5\' 11"'$  (1.803 m)     Wt  236 lb (107 kg)        BMI  32.92 kg/m??        Constitutional: WDWN, Pleasant and appropriate affect, No acute distress.     Respiratory: No respiratory distress or difficulties   Skin: No jaundice.     Neuro/Psych:  Alert and o riented. Affect appropriate.        Imaging:   NM Bone Scan [facility] Body 12/22/2015   IMPRESSION:  No bone scan evidence of osseous metastatic disease.       Surgical Pathology 01/14/2016   FINAL DIAGNOSIS:    PROSTATE, TRANSURETHRAL RESECTION:    BENIGN NODULAR HYPERPLASIA      Prostate Biopsy Pathology:       result  Gleason Grade  cores          12/12/2015  4+3= 7  POS:9  Total:12        Biopsy Pathology 12/12/2015   DIAGNOSIS:   A. Right Apex Needle biopsy  Benign prostatic tissue.   B. Right Mid Needle biopsy                  Benign prostatic tissue.   C. Right  Base Needle biopsy                  ADENOCARCINOMA, GLEASON SCORE 3 + 3 = 6 involving 5 % of the specimen (1 of 1 core(s) positive).                Grade Group 1. Ends not involved by the tumor. Perineural invasion.   D. Right  Lateral Apex Needle biopsy                  Benign prostatic tissue.   E. Right Lateral Mid Needle biopsy                  ADENOCARCINOMA, GLEASON SCORE 3 + 3 = 6 involving 10 % of the specimen (1 of 1 core(s) positive).                 Grade Group 1. Ends not involved by the tumor.   F. Right Lateral Base Needle biopsy                  ADENOCARCINOMA, GLEASON SCORE 3 + 3 = 6 involving 5 % of the specimen (1 of 1 core(s) positive).                 Grade Group 1. Ends not involved by the tumor.   G. Left Apex Needle biopsy                  ADENOCARCINOMA, GLEASON SCORE 4 + 3 = 7 involving 20 % of the specimen, discontinuous  (1 of 1 core(s) positive).                 Gleason 4 comprises 70 % of the cancer. Grade Group 3. Ends not involved by the tumor.   H. Left Mid Needle biopsy                  ADENOCARCINOMA, GLEASON SCORE 3 + 4 = 7 involving 60 % of the specimen, discontinuous (1 of 1 core(s)  positive).                Gleason 4 comprises 20 % of the cancer. Grade Group 2. Tumor involves one end.   I. Left Base Needle biopsy                  ADENOCARCINOMA, GLEASON SCORE 4 + 3 = 7 involving 80 % of the specimen, discontinuous  (1 of 1 core(s) positive).                Gleason 4 comprises 70 % of the cancer. Grade Group 3. Tumor involves one end. Perineural invasion.   J. Left Lateral Apex Needle biopsy                  ADENOCARCINOMA, GLEASON SCORE  3 + 4 = 7 involving 20 % of the specimen, discontinuous (1 of 1 core(s) positive).                Gleason 4 comprises 40 % of the cancer. Grade Group 2. Ends not involved by the tumor.   K. Left Lateral Mid Needle biopsy  ADENOCARCINOMA, GLEASON SCORE 4 + 3 = 7 involving 60 % of the specimen (3 of 3 core(s) positive).                Gleason 4 comprises 70 % of the cancer. Grade Group 3. Tumor involves one end. Perineural invasion.   L. Left  Lateral Base Needle biopsy                  ADENOCARCINOMA, GLEASON SCORE 4 + 3 = 7 involving 95 % of the specimen (1 of 1 core(s) positive).                Gleason 4 comprises 70 % of the cancer. Grade Group 3. Ends not involved  by the tumor. Perineural invasion.       Labs reviewed today:    No results found for this or any previous visit (from the past 24 hour(s)).           PSA /TESTOSTERONE - [facility]  PSA  Testosterone, total     Latest Ref Rng & Units  0 - 4 ng/mL  348 - 1197 ng/dL         04/13/2021  2.16       09/26/2020  1.49       03/13/2020  1.16       12/20/2019  0.99       09/19/2019  1.39  270         06/21/2019  1.06        03/21/2019  0.66  208     12/27/2018  0.42       09/26/2018  0.25  187     07/20/2018  0.11  215     03/14/2018   0.11  184     10/05/2017  0.08  131     04/26/2017  0.04  51     01/25/2017  <0.03           10/26/2016  <0.03  <3 (L)     08/03/2016  <0.03  <3 (L)         11/03/2015  3.75           08/07/2015  5.40 (H)          Teodora Medici PA-C   [facility] [place]       CC:   Karna Christmas, [place]

## 2021-05-28 NOTE — Telephone Encounter (Signed)
-----   Message from Domenica Reamer sent at 05/27/2021 10:35 AM EDT -----  Regarding: FW: PET/CT PSMA Pylarify Prostate (F18-DCFPyL) result    ----- Message -----  From: Colton Cox  Sent: 05/26/2021   2:46 PM EDT  To: [facility]  Subject: PET/CT PSMA Pylarify Prostate (F18-DCFPyL) r#    Hi Nyair Depaulo   I just had my PET scan and it shows "PyL binding in left internal iliac and right retrocrural lymph nodes compatible with metastatic prostate malignancy."  I have an appointment with Dr Roger Kill (oncology)next week and will show her the results. her appointment was scheduled from my primary dr at [place], [place])  can you go over the PET results and give me some thoughts?  I have a urology appointment on the 28th.  Not sure who I am scheduled with.    Thanks   [name]

## 2021-06-09 NOTE — Progress Notes (Signed)
Per orders of [employer].  I have referred Colton Cox to the Benefits Dept to begin benefit verification for Degarelix   (name of medication).       06/09/2021   Dosage:  240  Frequency: once   Appt Date: 06/17/2021  Dx Code:  O35  Secondary Dx Code:  (required for Prolia, Xgeva, Xtandi, Zytiga, [place])  Patient's ECOG Performance Status:  0  Cancer Staging:  cT1cNxM0 GS 4+3  Requested medication has been checked for any interactions with current medications: NO    Per orders of Brooke Noell.  I have referred Colton Cox to the Benefits Dept to begin benefit verification for Eligard   (name of medication).       06/09/2021   Dosage:  22.5  Frequency: every 3 months  Appt Date: TBD  Dx Code:  C61  Secondary Dx Code:  (required for Prolia, Xgeva, Xtandi, Zytiga, [place])  Patient's ECOG Performance Status:  0  Cancer Staging:  cT1cNxM0 GS 4+3  Requested medication has been checked for any interactions with current medications: NO

## 2021-06-09 NOTE — Progress Notes (Signed)
06/09/21: NO AUTH OR DEPOSIT REQUIRED FOR DEGARELIX, ELIGARD, XGEVA, PROLIA, AND ADMIN WILL BE COVERED @100 % AS 2NDRY WILL P/U MEDICRE'S CO-INS

## 2021-06-17 ENCOUNTER — Encounter: Admit: 2021-06-17 | Discharge: 2021-06-17 | Payer: MEDICARE | Attending: Urology | Primary: Medical

## 2021-06-17 ENCOUNTER — Ambulatory Visit: Attending: Urology | Primary: Medical

## 2021-06-17 DIAGNOSIS — N401 Enlarged prostate with lower urinary tract symptoms: Secondary | ICD-10-CM

## 2021-06-17 DIAGNOSIS — N138 Other obstructive and reflux uropathy: Secondary | ICD-10-CM

## 2021-06-17 LAB — PROSTATE SPECIFIC ANTIGEN, TOTAL (PSA)
PSA: 2.29 ng/mL (ref 0.00–4.00)
Prostate Specific Ag: 2.29 ng/mL (ref 0.00–4.00)

## 2021-06-17 LAB — TESTOSTERONE
Testosterone: 203 ng/dL — ABNORMAL LOW (ref 348–1197)
Testosterone: 203 ng/dL — ABNORMAL LOW (ref 348–1197)

## 2021-06-17 MED ORDER — TAMSULOSIN SR 0.4 MG 24 HR CAP
0.4 mg | ORAL_CAPSULE | Freq: Every day | ORAL | 3 refills | Status: AC
Start: 2021-06-17 — End: ?

## 2021-06-17 MED ORDER — DEGARELIX 120 MG SUB-Q SOLN
120 mg | Freq: Once | SUBCUTANEOUS | 0 refills | Status: AC
Start: 2021-06-17 — End: 2021-06-17

## 2021-06-17 NOTE — Progress Notes (Signed)
Colton Cox is a 75 y.o. male who is here today per the order of Dr. Claiborne Billings to receive degarelix and to have labs drawn.   Patient's identity has been verified.   Dr. Claiborne Billings was available in the clinic as incident to provider.     Patient has not been taking supplements of at least 500 mg of calcium and 400 international units of Vitamin D daily.  This is his first ADT injection.  He has had the necessary labs drawn.  PSA, TST, and Vitamin D obtained via venipuncture without any difficulty.  Patient will be notified with lab results.    Degarelix is administered to abdomen ( bilateral ) SQ without difficulty.     Patient tolerated the injection well.    Patient has been scheduled for the next injection which will be due in approximately 4 weeks  Patient has a follow-up appointment with provider on July 17, 2021      Encounter Diagnoses   Name Primary?    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     Answer:   2    PR DEGARELIX INJECTION     Order Specific Question:   Charge Quantity?     Answer:   61     Order Specific Question:   Dose     Answer:   240     Order Specific Question:   Site     Answer:   LEFT UPPER QUAD ABDOMEN     Comments:   ZOXW     Order Specific Question:   Expiration Date     Answer:   06/21/2023     Order Specific Question:   Lot#     Answer:   [id number]     Order Specific Question:   Manufacturer     Answer:   [employer]mar     Order Specific Question:   Perfomed by/Witnessed by:     Answer:   Forest Gleason     Order Specific Question:   NDC#     Answer:   [address]-2 [[id number]]    COLLECTION VENOUS BLOOD,VENIPUNCTURE    tamsulosin (FLOMAX) 0.4 mg capsule     Sig: Take 2 Capsules by mouth  daily (after dinner).     Dispense:  180 Capsule     Refill:  3    degarelix (FIRMAGON) 120 mg solr injection     Sig: 240 mg by SubCUTAneous route once for 1 dose.     Dispense:  240 mg     Refill:  0       Patient did not supply own medication.    La'Faye J. Cason

## 2021-06-17 NOTE — Progress Notes (Deleted)
Progress Notes by Teodora Medici, PA-C at 06/17/21 0900                Author: Teodora Medici, PA-C  Service: --  Author Type: Physician Assistant       Filed: 06/17/21 0952  Encounter Date: 06/17/2021  Status: Cosign Needed           Editor: Charyl Dancer (Physician Assistant)  Cosign Required: Yes                       Colton Cox   DOB September 02, 1946   75 y.o.      PROSTATE CANCER ESTABLISHED DIAGNOSIS        Encounter Diagnoses              ICD-10-CM  ICD-9-CM          1.  BPH with obstruction/lower urinary tract symptoms   N40.1  600.01           N13.8  599.69          2.  Malignant neoplasm of prostate ([place])   C61  185     3.  Vitamin D deficiency   E55.9  268.9          4.  Lymphadenopathy   R59.1  785.6           Assessment:   - 75 y.o. male with cT1cNxM0 GS 4+3, Prostate cancer in 9/12 cores, prostate volume of   49.57 cm3, Pre-biopsy PSA= 3.75ng/mL.      Bone scan 12/22/15: negative for metastatic prostate cancer.    CTU 2/17: negative    ADT initiated on 02/06/16 with Eligard 45 mg injection.  Single dose.    Completed XRT 03/18/16-04/23/16 with Dr. Tobe Sos     PSA nadir: 0.11 ng/ml (06/2018) with PSA back to baseline    DRE 03/21/2019: No evidence recurrence    PSMA PET 05/22/21: PyL binding in left internal iliac and right retrocrural lymph nodes compatible with metastatic prostate malignancy.    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 Taking Flomax 0.8 mg daily with no significant change compared to 0.4 mg        - History of microscopic hematuria, 1+ blood and 5-10 RBC by UA on 11/11/15.    Urine cytology 10/2015 showed rare atypical urothelial cells.     CT Urogram 11/21/15 showed slightly attenuated and perhaps slightly thickened mid-distal right ureter/equivocally strictured, but without proximal dilatation. Rretrograde ureterography in  OR 4/26 w/o evidence of filling defect    No gross hematuria      - Vit D Deficiency on Vit D 2000I U daily. Vit D 18.6 on 03/14/18      - History of TIA on Plavix 75 mg and aspirin 81 mg daily      - History of tobacco use      Plan:     Reviewed PSMA PET Scan results with patient- advised PET scan findings c/w M1a disease     Advised that prior studies for treatment of hormone-sensitive M1a prostate cancer were based upon standard conventional imaging studies and not PSMA PET     Discussed his PSMA PET is c/w very low-volume oligometastatic disease      Recommend start ADT for duration of 2 years and  consideration of SBRT to LNs. Will refer to radiation oncology     Discussed consideration of oral oncolytics, however, given low PSA, slow PSADT, and low volume of disease burden, reasonable to defer given patient's age     Can resume flomax 0.4 mg given no significant improvement with 0.8 mg     Follow-up in 4 weeks for Eligard 22.5 mg to finalize treatment plan      In the course of this visit on this date, I spent 40 minutes on this encounter, which included face-to-face examination, counseling, and/or discussion of care with patient as well as time spent reviewing chart and documenting details of visit.      Chief Complaint:      Chief Complaint       Patient presents with        ?  Prostate Cancer     ?  Benign Prostatic Hypertrophy        ?  (LUTS) Lower Urinary Tract Symptoms              HPI: Colton Cox  75 y.o. MALE who returns for follow-up of his prostate cancer cT1cNxM0 GS 4+3 (Dx 12/12/15), Prostate cancer in 9/12 cores, prostate volume of  49.57 cm3, Pre-biopsy PSA= 3.75ng/mL s/p ADT and XRT completed  04/2016 and BPH s/p ProLEP 12/2015. Here today to review recent PSMA PET Scan results.       No acute complaints today. Tried increasing flomax to 0.8 mg with no change in nocturia x 2-3. States he stays well-hydrated and produced 2.5 L of urine on a recent 24 hour urine collection with hematology.  Was referred to Dr. Roger Kill for anemia.      Notes hot flashes and decreased energy/muscle mass loss with ADT.        Past Medical History:        Diagnosis  Date         ?  Cerebral artery occlusion with cerebral infarction [facility])       ?  Colon polyps       ?  Hypertension           ?  Personal history of prostate cancer               Past Surgical History:         Procedure  Laterality  Date          ?  HX COLONOSCOPY    01/2018     ?  HX HERNIA REPAIR         ?  HX OTHER SURGICAL    06/2016          Cat Scan: spot noted on lung          ?  HX UROLOGICAL  N/A  01/14/2016          Cysto, Right RPG, and Protouch laser enucleation of the prostate with tissue morcellation - Dr. Claiborne Billings          ?  PR APPENDECTOMY                 Social History          Tobacco Use         ?  Smoking status:  Former     ?  Smokeless tobacco:  Former              Quit date:  09/20/1988       Substance Use Topics         ?  Alcohol use:  Yes         ?  Drug use:  No             Allergies        Allergen  Reactions         ?  Bee Sting  [Sting, Bee]  Shortness of Breath             Family History         Problem  Relation  Age of Onset          ?  Colon Cancer  Mother            ?  Colon Cancer  Father               Current Outpatient Medications          Medication  Sig  Dispense  Refill           ?  tamsulosin (FLOMAX) 0.4 mg capsule  Take 2 Capsules by mouth daily (after dinner).  180 Capsule  3     ?  degarelix (FIRMAGON) 120 mg solr injection  240 mg by SubCUTAneous route once for 1 dose.  240 mg  0     ?  multivitamins-minerals-lutein (Multivitamin 50 Plus) tab tablet           ?  losartan (COZAAR) 50 mg tablet           ?  hydroCHLOROthiazide (MICROZIDE) 12.5 mg capsule           ?  EPIPEN 2-PAK 0.3 mg/0.3 mL injection           ?  meloxicam (MOBIC) 15 mg tablet  Take 15 mg by mouth daily.         ?  clopidogreL (PLAVIX) 75 mg tab  Take  by mouth.         ?  simvastatin (ZOCOR) 20 mg tablet           ?  ergocalciferol (ERGOCALCIFEROL)  50,000 unit capsule  Take 1 Cap by mouth every seven (7) days. (Patient not taking: Reported on 06/17/2021)  8 Cap  0     ?  ibuprofen (MOTRIN) 800 mg tablet   (Patient not taking: No sig reported)         ?  calcium cit-vit D3-isoflavon 2 100-1,000-80 mg-unit-mg tab  Take  by mouth. (Patient not taking: No sig reported)         ?  cholecalciferol, vitamin D3, 50 mcg (2,000 unit) tab  Take  by mouth. (Patient not taking: No sig reported)               ?  multivitamin (ONE A DAY) tablet  Take 1 Tab by mouth daily. (Patient not taking: No sig reported)               Review of Systems:   Constitutional: Fever: No   Skin: Rash: No   HEENT: Hearing difficulty: No   Eyes: Blurred vision: No   Cardiovascular: Chest pain: No   Respiratory: Shortness of breath: No   Gastrointestinal: Nausea/vomiting: No   Musculoskeletal: Back pain: No   Neurological: Weakness: No   Psychological: Memory loss: No   Comments/additional findings:    All other systems reviewed and are negative.      Physical Exam:   Visit Vitals      Wt  224 lb (101.6 kg)  BMI  31.24 kg/m           Constitutional: WDWN, Pleasant and appropriate affect, No acute distress.     Respiratory: No respiratory distress or difficulties   Skin: No jaundice.     Neuro/Psych:  Alert and o riented. Affect appropriate.        Imaging:   NM Bone Scan [facility] Body 12/22/2015   IMPRESSION:  No bone scan evidence of osseous metastatic disease.       PSMA PET Scan 05/22/21   IMPRESSION   1. PyL binding in left internal iliac and right retrocrural lymph nodes compatible with metastatic prostate malignancy.       Pathology:    Surgical Pathology 01/14/2016   FINAL DIAGNOSIS:    PROSTATE, TRANSURETHRAL RESECTION:    BENIGN NODULAR HYPERPLASIA      Prostate Biopsy Pathology:       result  Gleason Grade  cores          12/12/2015  4+3= 7  POS:9  Total:12        Biopsy Pathology 12/12/2015   DIAGNOSIS:   A. Right Apex Needle biopsy                  Benign prostatic tissue.   B. Right Mid  Needle biopsy                  Benign prostatic tissue.   C. Right  Base Needle biopsy                  ADENOCARCINOMA, GLEASON SCORE 3 + 3 = 6 involving 5 % of the specimen (1 of 1 core(s) positive).                Grade Group 1. Ends not involved by the tumor. Perineural invasion.   D. Right  Lateral Apex Needle biopsy                  Benign prostatic tissue.   E. Right Lateral Mid Needle biopsy                  ADENOCARCINOMA, GLEASON SCORE 3 + 3 = 6 involving 10 % of the specimen (1 of 1 core(s) positive).                 Grade Group 1. Ends not involved by the tumor.   F. Right Lateral Base Needle biopsy                  ADENOCARCINOMA, GLEASON SCORE 3 + 3 = 6 involving 5 % of the specimen (1 of 1 core(s) positive).                 Grade Group 1. Ends not involved by the tumor.   G. Left Apex Needle biopsy                  ADENOCARCINOMA, GLEASON SCORE 4 + 3 = 7 involving 20 % of the specimen, discontinuous (1 of 1 core(s) positive).                 Gleason 4 comprises 70 % of the cancer. Grade Group 3. Ends not involved by the tumor.   H. Left Mid Needle biopsy                  ADENOCARCINOMA, GLEASON SCORE 3 + 4 = 7 involving 60 % of the specimen, discontinuous (1 of 1  core(s)  positive).                Gleason 4 comprises 20 % of the cancer. Grade Group 2. Tumor involves one end.   I. Left Base Needle biopsy                  ADENOCARCINOMA, GLEASON SCORE 4 + 3 = 7 involving 80 % of the specimen, discontinuous  (1 of 1 core(s) positive).                Gleason 4 comprises 70 % of the cancer. Grade Group 3. Tumor involves one end. Perineural invasion.   J. Left Lateral Apex Needle biopsy                  ADENOCARCINOMA, GLEASON SCORE  3 + 4 = 7 involving 20 % of the specimen, discontinuous (1 of 1 core(s) positive).                Gleason 4 comprises 40 % of the cancer. Grade Group 2. Ends not involved by the tumor.   K. Left Lateral Mid Needle biopsy                   ADENOCARCINOMA, GLEASON SCORE 4 + 3 = 7  involving 60 % of the specimen (3 of 3 core(s) positive).                Gleason 4 comprises 70 % of the cancer. Grade Group 3. Tumor involves one end. Perineural invasion.   L. Left  Lateral Base Needle biopsy                  ADENOCARCINOMA, GLEASON SCORE 4 + 3 = 7 involving 95 % of the specimen (1 of 1 core(s) positive).                Gleason 4 comprises 70 % of the cancer. Grade Group 3. Ends not involved  by the tumor. Perineural invasion.       Labs reviewed:    No results found for this or any previous visit (from the past 24 hour(s)).           PSA /TESTOSTERONE - [facility]  PSA  Testosterone, total     Latest Ref Rng & Units  0 - 4 ng/mL  348 - 1197 ng/dL         04/13/2021  2.16       09/26/2020  1.49       03/13/2020  1.16       12/20/2019  0.99       09/19/2019  1.39  270         06/21/2019  1.06        03/21/2019  0.66  208     12/27/2018  0.42       09/26/2018  0.25  187     07/20/2018  0.11  215     03/14/2018  0.11  184     10/05/2017  0.08  131     04/26/2017  0.04  51     01/25/2017  <0.03           10/26/2016  <0.03  <3 (L)     08/03/2016  <0.03  <3 (L)         11/03/2015  3.75           08/07/2015  5.40 (H)  Teodora Medici PA-C   [facility]       CC:   Henry Russel, FNP    CC: Dr. Roger Kill

## 2021-06-17 NOTE — Progress Notes (Signed)
06/17/2021 Faxed referral request form with required documents to [facility] for Dr Jethro Bolus

## 2021-06-19 LAB — VITAMIN D, 25 HYDROXY
VITAMIN D, 25-HYDROXY: 22.4 ng/ml — ABNORMAL LOW (ref 30.0–100.0)
Vit D, 25-Hydroxy: 22.4 ng/ml — ABNORMAL LOW (ref 30.0–100.0)

## 2021-06-26 NOTE — Progress Notes (Signed)
Progress Notes  by Richardo Priest at 06/26/21 1502                Author: Richardo Priest  Service: --  Author Type: --       Filed: 06/26/21 1505  Encounter Date: 06/26/2021  Status: Signed          Editor: Richardo Priest               06/26/2021 I called the office of Dr Jethro Bolus patient is scheduled 06/30/2021    @ 02:30 pm

## 2021-07-17 ENCOUNTER — Encounter: Attending: Urology | Primary: Family

## 2021-07-17 ENCOUNTER — Encounter

## 2021-07-17 ENCOUNTER — Ambulatory Visit: Attending: Urology | Primary: Medical

## 2021-07-17 DIAGNOSIS — M818 Other osteoporosis without current pathological fracture: Secondary | ICD-10-CM

## 2021-07-17 MED ORDER — APALUTAMIDE 60 MG TABLET
60 mg | ORAL_TABLET | Freq: Every day | ORAL | 5 refills | Status: DC
Start: 2021-07-17 — End: 2021-07-21

## 2021-07-17 MED ORDER — ELIGARD 22.5 MG (3 MONTH) SUBCUTANEOUS SYRINGE
22.5 mg | Freq: Once | SUBCUTANEOUS | 0 refills | Status: AC
Start: 2021-07-17 — End: 2021-07-17

## 2021-07-17 MED ORDER — APALUTAMIDE 60 MG TABLET
60 mg | ORAL_TABLET | Freq: Every day | ORAL | 5 refills | Status: DC
Start: 2021-07-17 — End: 2021-07-17

## 2021-07-17 NOTE — Progress Notes (Signed)
Per orders of Dr. Tresa Endo.  I have referred Colton Cox to the Benefits Dept to begin benefit verification for [name]   (name of medication).       07/17/2021   Dosage:  240MG    Frequency: DAILY   Appt Date: 07/17/2021  Dx Code:  R48  Secondary Dx Code:  (required for [name], Zytiga, Xofigo & Provenge)  Patient's ECOG Performance Status:  0  Cancer Staging:  1  Requested medication has been checked for any interactions with current medications: YES    Karren Cobble

## 2021-07-17 NOTE — Telephone Encounter (Signed)
RX PENDED- T/C FOR IOD.    Colton Cox

## 2021-07-17 NOTE — Progress Notes (Signed)
Auth for Erleada denied. Routing message to MD to see if he wants to switch drug.  Georgiana Spinner, LPN

## 2021-07-17 NOTE — Progress Notes (Signed)
09/24/2021 9:15 AM [id number] BONE DENSITY STUDY - [facility]EN[facility]AL Active - Standing Other Facility Manual Meier, MD

## 2021-07-17 NOTE — Progress Notes (Signed)
Patient called back in to speak about this medication. Patient is fine with picking up here and the $14 copay. Patient is aware that he needs to sign Xtandi paperwork before he picks up his medication. Patient states that when the pharmacy informs him his medication is ready to pick up, he will give me a call so I know he's coming in. Will send a message to pharmacy team to fill this script when MD is next in office.  Georgiana Spinner, LPN

## 2021-07-17 NOTE — Progress Notes (Signed)
Auth submitted via CMM.  Miranda Michaels, LPN

## 2021-07-17 NOTE — Progress Notes (Signed)
Imaging placed in future folder.  Imaging will be sent out 1 month prior to f/u appt unless noted otherwise     SCHEDULED TO Faxed imaging to [facility]; [facility] [phone]; (PT WILL CALL [phone] TO SCHEDULE); DEXA BONE; SET TO FAX 07/26/21

## 2021-07-17 NOTE — Progress Notes (Signed)
Auth submitted via CMM.  Colton Michaels, LPN

## 2021-07-17 NOTE — Progress Notes (Signed)
Will fill today 11/08 since kelly in office. Kathryne Hitch

## 2021-07-17 NOTE — Progress Notes (Signed)
Colton Cox is a 75 y.o. male who is here today per the order of Dr. Claiborne Billings to receive eligard.   Patient's identity has been verified.   Dr. Claiborne Billings was available in the clinic as incident to provider.     Patient has been taking supplements of at least 500 mg of calcium and 400 international units of Vitamin D daily.  This is not his first ADT injection.  He has had the necessary labs drawn.      Eligard is administered to abdomen ( right) SQ without difficulty.     Patient tolerated the injection well.    Patient has been scheduled for the next injection which will be due in approximately 3 months  Patient has a follow-up appointment with provider on       Encounter Diagnoses   Name Primary?    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     Answer:   3     Order Specific Question:   Dose     Answer:   22.5     Order Specific Question:   Site     Answer:   RIGHT LOWER QUAD ABDOMEN     Order Specific Question:   Expiration Date     Answer:   10/21/2022     Order Specific Question:   Lot#     Answer:   [id number]     Order Specific Question:   Manufacturer     Answer:   tolmar     Order Specific Question:   Perfomed by/Witnessed by:     Answer:   lcason     Order Specific Question:   NDC#     Answer:   45409-811-91 [[id number]]    apalutamide (ERLEADA) 60 mg tablet     Sig: Take 4 Tablets by mouth daily for 30 days.     Dispense:  120 Tablet     Refill:  5    leuprolide (Eligard, 3 month,) 22.5 mg syrg injection     Sig: 1 Syringe by SubCUTAneous route once for 1 dose.     Dispense:  1 Each     Refill:  0       Patient did not supply own medication.    La'Faye J. Cason

## 2021-07-17 NOTE — Progress Notes (Signed)
Pended and routed T/C RX to MD for sign and print.  Georgiana Spinner, LPN

## 2021-07-17 NOTE — Progress Notes (Signed)
MyChart msg sent to for pt call hospital to schedule imaging @ [phone]

## 2021-07-17 NOTE — Progress Notes (Signed)
Progress Notes by Georgiana Spinner, LPN at 41/74/08 1448                Author: Georgiana Spinner, LPN  Service: --  Author Type: Licensed Nurse       Filed: 07/21/21 1445  Encounter Date: 07/17/2021  Status: Signed          Editor: Georgiana Spinner, LPN (Licensed Nurse)               Maxine Glenn MD:        Kathreen Cornfield, MD    You 15 minutes ago (2:29 PM)        DK     That would be fine           You    Kathreen Cornfield, MD 33 minutes ago (2:11 PM)        MM     Auth for [place] denied, would you like to try a different drug? Gillermina Phy is the preferred first line for [employer]. Please advise!   Georgiana Spinner, LPN

## 2021-07-17 NOTE — Progress Notes (Signed)
Test claim please! I've got the script over here!  Georgiana Spinner, LPN

## 2021-07-17 NOTE — Progress Notes (Signed)
MD printed script. Waiting on auth to run a test claim.  Lona Millard, LPN

## 2021-07-17 NOTE — Progress Notes (Signed)
Attempted to speak with the patient to inform, patient did not answer the phone. Left a message.   Georgiana Spinner, LPN

## 2021-07-17 NOTE — Progress Notes (Signed)
Auth approved via CMM. Will scan in approval letter once we receive it.     Message from plan: CaseId:[id number];Status:Approved;Review Type:Prior Auth;Coverage Start Date:06/21/2021;Coverage End Date:09/19/2098;    Georgiana Spinner, LPN

## 2021-07-17 NOTE — Progress Notes (Signed)
Ran T/C for xtandi IOD permitted copay is 14.00 sent to navigators. Kimberly Bergeron

## 2021-07-17 NOTE — Progress Notes (Signed)
Per orders of Dr. Tresa Endo I have referred Colton Cox to the Benefits Dept to begin benefit verification for [name]   (name of medication).       07/21/2021   Dosage:  160MG  DAILY   Appt Date: 07/21/2021  Dx Code:  A41  Secondary Dx Code:  (required for [name], Zytiga, Xofigo & Provenge)  Patient's ECOG Performance Status:  0  Cancer Staging:  1  Requested medication has been checked for any interactions with current medications: YES    Lona Millard, LPN

## 2021-07-17 NOTE — Progress Notes (Signed)
[name] has an order for DEXA BONE DENSITY STUDY AXIAL [[id number]] (Order [id number])        ALL MALE PATIENTS NEEDING AN MRI OF PELVIS OR PROSTATE, MUST BE SCHEDULED AT ONE OF THE FOLLOWING:    **MRI/CT (Preferred [facility])  **[facility] [place] (Preferred [facility])  **[place] (Preferred [place])  [facility]  [place]      To be done at [facility]    Needed by:  September 14, 2022    Patient has a follow-up appointment: January 23,2023    If MRI, does patient have a pacemaker:   N/A    Order has been placed in connect care:  Yes    Is this a STAT order:  No      [employer]

## 2021-07-17 NOTE — Progress Notes (Signed)
Progress Notes by Kathreen Cornfield, MD at 07/17/21 0830                Author: Kathreen Cornfield, MD  Service: --  Author Type: Physician       Filed: 07/17/21 0854  Encounter Date: 07/17/2021  Status: Signed          Editor: Kathreen Cornfield, MD (Physician)                       Colton Cox   DOB 1946/03/14   74 y.o.      PROSTATE CANCER ESTABLISHED DIAGNOSIS        Encounter Diagnoses              ICD-10-CM  ICD-9-CM          1.  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  Malignant neoplasm of prostate ([place])   C61  185              Assessment:   - 75 y.o. male with cT1cNxM0 GS 4+3, Prostate cancer in 9/12 cores, prostate volume of   49.57 cm3, Pre-biopsy PSA= 3.75ng/mL.      Bone scan 12/22/15: negative for metastatic prostate cancer.    CTU 2/17: negative    ADT initiated on 02/06/16 with Eligard 45 mg injection.  Single dose.    Completed XRT 03/18/16-04/23/16 with Dr. Tobe Sos     PSA nadir: 0.11 ng/ml (06/2018) with PSA back to baseline    DRE 03/21/2019: No evidence recurrence    PSMA PET 05/22/21: PyL binding in left internal iliac and right retrocrural lymph nodes compatible with metastatic prostate malignancy.    Now with upstaged [id number] hormone-sensitive prostate cancer    Restarted ADT on 06/17/21 with Degeralix 240 mg inj.    Most recent PSA was 2.29 ng/mL and T was 203 ng/dL on 06/17/21.      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 Taking Flomax 0.8 mg daily with no significant change compared to 0.4 mg        - History of microscopic hematuria, 1+ blood and 5-10 RBC by [facility] on 11/11/15.    Urine cytology 10/2015 showed rare atypical urothelial cells.     CT Urogram 11/21/15 showed slightly attenuated and perhaps slightly thickened mid-distal right ureter/equivocally strictured, but without proximal dilatation. 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 Most recent Vit D was 22.4 ng/mL on 06/17/21      - History of TIA on Plavix 75 mg and aspirin 81 mg daily      - History of tobacco use      Plan:   ??  Reviewed most recent PSA - 2.29 ng/m on 06/17/21.   ??  Discussed treatment options for hormone sensitive metastatic prostate cancer include XRT + 2 years ADT + oral oncolytic with antiandrogen versus Zytiga   ??  Plan to start apalutamide.  Chart sent to nursing navigation team to start.  Risk benefits discussed including seizure, rash, fatigue   ??  Eligard 22.5 inj administered today.  We will continue   ??  Continue follow-up with  radiation oncology for radiotherapy to the pelvic lymph nodes.  Plan will be to possibly target retroperitoneal lesion after completion   ??  Recommended he obtain a Dexa to assess candidacy for Prolia   ??  Instructed patient to start OTC Ca and Vit D for bone health on ADT.    ??  Follow up in 3 months for Eligard inj with Dexa Scan and PSA, T, Ca, and Vit D prior.          Chief Complaint:      Chief Complaint       Patient presents with        ?  Prostate Cancer                 HPI: Colton Cox  75 y.o. MALE who returns for follow-up of his prostate cancer cT1cNxM0 GS 4+3 (Dx 12/12/15), Prostate cancer in 9/12 cores, prostate volume of  49.57 cm3, Pre-biopsy PSA= 3.75ng/mL s/p ADT and XRT completed  04/2016 and BPH s/p ProLEP 12/2015.       Patient reports that he just start his XRT with Dr. Jethro Bolus yesterday 07/16/21.    States that he is still experiencing hot flashes and gynecomasty on ADT. States that his hot flashes are not overly bothersome.    Reports urgency.    Currently taking Flomax 0.4 mg daily       Reports he thought he had a stroke ~8 years ago. Started plavix at this time.    Denies any episodes of seizures.            Past Medical History:        Diagnosis  Date         ?  Cerebral artery occlusion with cerebral infarction [facility])       ?  Colon polyps       ?  Hypertension            ?  Personal history of prostate cancer               Past Surgical History:         Procedure  Laterality  Date          ?  HX COLONOSCOPY    01/2018     ?  HX HERNIA REPAIR         ?  HX OTHER SURGICAL    06/2016          Cat Scan: spot noted on lung          ?  HX UROLOGICAL  N/A  01/14/2016          Cysto, Right RPG, and Protouch laser enucleation of the prostate with tissue morcellation - Dr. Claiborne Billings          ?  PR APPENDECTOMY                 Social History          Tobacco Use         ?  Smoking status:  Former     ?  Smokeless tobacco:  Former              Quit date:  09/20/1988       Substance Use Topics         ?  Alcohol use:  Yes         ?  Drug use:  No  Allergies        Allergen  Reactions         ?  Bee Sting  [Sting, Bee]  Shortness of Breath             Family History         Problem  Relation  Age of Onset          ?  Colon Cancer  Mother            ?  Colon Cancer  Father               Current Outpatient Medications          Medication  Sig  Dispense  Refill           ?  apalutamide (ERLEADA) 60 mg tablet  Take 4 Tablets by mouth daily for 30 days.  120 Tablet  5     ?  leuprolide (Eligard, 3 month,) 22.5 mg syrg injection  1 Syringe by SubCUTAneous route once for 1 dose.  1 Each  0     ?  tamsulosin (FLOMAX) 0.4 mg capsule  Take 2 Capsules by mouth daily (after dinner).  180 Capsule  3     ?  multivitamins-minerals-lutein (Multivitamin 50 Plus) tab tablet           ?  losartan (COZAAR) 50 mg tablet           ?  hydroCHLOROthiazide (MICROZIDE) 12.5 mg capsule           ?  ergocalciferol (ERGOCALCIFEROL) 50,000 unit capsule  Take 1 Cap by mouth every seven (7) days.  8 Cap  0     ?  EPIPEN 2-PAK 0.3 mg/0.3 mL injection           ?  ibuprofen (MOTRIN) 800 mg tablet           ?  meloxicam (MOBIC) 15 mg tablet  Take 15 mg by mouth daily.         ?  clopidogreL (PLAVIX) 75 mg tab  Take  by mouth.         ?  calcium cit-vit D3-isoflavon 2 100-1,000-80 mg-unit-mg tab  Take  by mouth.         ?   cholecalciferol, vitamin D3, 50 mcg (2,000 unit) tab  Take  by mouth.         ?  simvastatin (ZOCOR) 20 mg tablet                 ?  multivitamin (ONE A DAY) tablet  Take 1 Tab by mouth daily. (Patient not taking: No sig reported)               Review of Systems:   Constitutional: Fever: No   Skin: Rash: No   HEENT: Hearing difficulty: No   Eyes: Blurred vision: No   Cardiovascular: Chest pain: No   Respiratory: Shortness of breath: No   Gastrointestinal: Nausea/vomiting: No   Musculoskeletal: Back pain: No   Neurological: Weakness: No   Psychological: Memory loss: No   Comments/additional findings:    All other systems reviewed and are negative.      Physical Exam:   Visit Vitals      Wt  229 lb (103.9 kg)        BMI  31.94 kg/m??              Constitutional: WDWN, Pleasant and appropriate affect, No acute  distress.     Respiratory: No respiratory distress or difficulties   Skin: No jaundice.     Neuro/Psych:  Alert and o riented. Affect appropriate.        Imaging:   NM Bone Scan [facility] Body 12/22/2015   IMPRESSION:  No bone scan evidence of osseous metastatic disease.       PSMA PET Scan 05/22/21   IMPRESSION   1. PyL binding in left internal iliac and right retrocrural lymph nodes compatible with metastatic prostate malignancy.       Pathology:    Surgical Pathology 01/14/2016   FINAL DIAGNOSIS:    PROSTATE, TRANSURETHRAL RESECTION:    BENIGN NODULAR HYPERPLASIA      Prostate Biopsy Pathology:       result  Gleason Grade  cores          12/12/2015  4+3= 7  POS:9  Total:12        Biopsy Pathology 12/12/2015   DIAGNOSIS:   A. Right Apex Needle biopsy                  Benign prostatic tissue.   B. Right Mid Needle biopsy                  Benign prostatic tissue.   C. Right  Base Needle biopsy                  ADENOCARCINOMA, GLEASON SCORE 3 + 3 = 6 involving 5 % of the specimen (1 of 1 core(s) positive).                Grade Group 1. Ends not involved by the tumor. Perineural invasion.   D. Right  Lateral Apex Needle biopsy                   Benign prostatic tissue.   E. Right Lateral Mid Needle biopsy                  ADENOCARCINOMA, GLEASON SCORE 3 + 3 = 6 involving 10 % of the specimen (1 of 1 core(s) positive).                 Grade Group 1. Ends not involved by the tumor.   F. Right Lateral Base Needle biopsy                  ADENOCARCINOMA, GLEASON SCORE 3 + 3 = 6 involving 5 % of the specimen (1 of 1 core(s) positive).                 Grade Group 1. Ends not involved by the tumor.   G. Left Apex Needle biopsy                  ADENOCARCINOMA, GLEASON SCORE 4 + 3 = 7 involving 20 % of the specimen, discontinuous (1 of 1 core(s) positive).                 Gleason 4 comprises 70 % of the cancer. Grade Group 3. Ends not involved by the tumor.   H. Left Mid Needle biopsy                  ADENOCARCINOMA, GLEASON SCORE 3 + 4 = 7 involving 60 % of the specimen, discontinuous (1 of 1 core(s)  positive).                Gleason 4 comprises 20 %  of the cancer. Grade Group 2. Tumor involves one end.   I. Left Base Needle biopsy                  ADENOCARCINOMA, GLEASON SCORE 4 + 3 = 7 involving 80 % of the specimen, discontinuous  (1 of 1 core(s) positive).                Gleason 4 comprises 70 % of the cancer. Grade Group 3. Tumor involves one end. Perineural invasion.   J. Left Lateral Apex Needle biopsy                  ADENOCARCINOMA, GLEASON SCORE  3 + 4 = 7 involving 20 % of the specimen, discontinuous (1 of 1 core(s) positive).                Gleason 4 comprises 40 % of the cancer. Grade Group 2. Ends not involved by the tumor.   K. Left Lateral Mid Needle biopsy                   ADENOCARCINOMA, GLEASON SCORE 4 + 3 = 7 involving 60 % of the specimen (3 of 3 core(s) positive).                Gleason 4 comprises 70 % of the cancer. Grade Group 3. Tumor involves one end. Perineural invasion.   L. Left  Lateral Base Needle biopsy                  ADENOCARCINOMA, GLEASON SCORE 4 + 3 = 7 involving 95 % of the specimen (1 of 1 core(s) positive).                 Gleason 4 comprises 70 % of the cancer. Grade Group 3. Ends not involved  by the tumor. Perineural invasion.       Labs reviewed:    No results found for this or any previous visit (from the past 24 hour(s)).           PSA /TESTOSTERONE - [facility]  PSA  Testosterone, total     Latest Ref Rng & Units  0 - 4 ng/mL  348 - 1197 ng/dL         06/17/2021  2.29  203     04/13/2021  2.16  187     09/26/2020  1.49       03/13/2020  1.16       12/20/2019  0.99       09/19/2019  1.39  270         06/21/2019  1.06        03/21/2019  0.66  208     12/27/2018  0.42       09/26/2018  0.25  187     07/20/2018  0.11  215     03/14/2018  0.11  184     10/05/2017  0.08  131     04/26/2017  0.04  51     01/25/2017  <0.03           10/26/2016  <0.03  <3 (L)     08/03/2016  <0.03  <3 (L)         11/03/2015  3.75           08/07/2015  5.40 (H)             Kathreen Cornfield, MD   [facility]  [place], [place]   Phone: [phone]       Medical documentation is provided with the assistance of Deeann Cree, medical scribe for Kathreen Cornfield, MD on 07/17/2021         CC:   Henry Russel, FNP    CC: Dr. Roger Kill

## 2021-07-21 ENCOUNTER — Encounter

## 2021-07-21 MED ORDER — ENZALUTAMIDE 40 MG CAPSULE
40 mg | ORAL_CAPSULE | ORAL | 5 refills | Status: DC
Start: 2021-07-21 — End: 2021-07-30

## 2021-07-21 NOTE — Telephone Encounter (Signed)
T/C RX pended and routed to MD for sign and print.  Miranda Michaels, LPN

## 2021-07-21 NOTE — Telephone Encounter (Signed)
Changed dosing instructions on RX, per MD order. Re-pending to MD for sign and print.  Georgiana Spinner, LPN

## 2021-07-21 NOTE — Telephone Encounter (Signed)
Lets do half dose as he is on plavix, thx

## 2021-07-28 NOTE — Telephone Encounter (Signed)
Patient has new start xtandi ready for pick up in the dispensary. Copay $14.00. Patient thanked me and stated that he has to sign some paperwork with the nurse navigators then will come pick it up on 07/29/21. No questions at this time.     Kerry Dory, CPHT

## 2021-07-30 ENCOUNTER — Encounter

## 2021-07-30 NOTE — Telephone Encounter (Signed)
Sig must be written differently in order for the pharmacy to be able to dispense the patient's lowered dose. Pended and routed to MD for sign and print.  Georgiana Spinner, LPN

## 2021-08-06 MED ORDER — ENZALUTAMIDE 40 MG CAPSULE
40 mg | ORAL_CAPSULE | ORAL | 5 refills | Status: AC
Start: 2021-08-06 — End: 2021-09-22

## 2021-08-06 NOTE — Telephone Encounter (Signed)
Med check for Oral Oncolytic:       Called pt to perform med check for oral oncolytic:  XTANDI      Are you taking your medication as prescribed?  YES, TWO TABS DAILY                Have you been checking your blood pressure or had it checked by another provider recently? YES  If so, was it within normal limits? IT WAS ELEVATED.     Have you noticed any new rash on any part of your body? NO    Have you experienced any swelling in your extremities? NO     Have you experienced any new or worsening pain in the last 30 days?  NO    Have you experienced any changes in your ability to perform your usual daily activities?  NO                                                                                        Have you experienced any significant changes in your weight or appetite?  NO     Have you experienced any significant changes in your ability to sleep?  NO     Have you experienced any increase in fatigue?  NOTES HE WAS EXPERIENCING FATIGUE BEFORE XTANDI    Patient is receiving his medication from [facility] (HIS PRESCRIPTION IS NOW 2 A DAY) without difficulty.      Patient's next appointment with provider is scheduled for 10/12/21    Patient IS up to date on all labs required per protocol for medication.      Patient advised to call the office if any issues arise.    Page Spiro

## 2021-08-12 NOTE — Telephone Encounter (Signed)
Med check for Oral Oncolytic:       Called pt to perform med check for oral oncolytic:  XTANDI      Are you taking your medication as prescribed?  YES, TWO TABS DAILY                Have you been checking your blood pressure or had it checked by another provider recently? YES  If so, was it within normal limits? THEY HAVE BEEN ELEVATED SINCE THE START OF XTANDI, REPORTS HE HAD THE FLU, THAT COULD HAVE CAUSED IT. 140-150 RANGE. USUALLY IN THE 120'S     Have you noticed any new rash on any part of your body? NO    Have you experienced any swelling in your extremities? NO     Have you experienced any new or worsening pain in the last 30 days?  NO    Have you experienced any changes in your ability to perform your usual daily activities?  A LITTLE SLOWER, NOT SURE IF ITS RADIATION OR A COMBINATION OF RADIATION AND XTANDI                                                                                        Have you experienced any significant changes in your weight or appetite?  BOTH HAVE SLIGHTLY COME DOWN, BUT HE NOTES IT COULD HAVE BEEN THE FLU, IT'S HARD FOR HIM TO TELL.     Have you experienced any significant changes in your ability to sleep?  NO     Have you experienced any increase in fatigue?  YES, A LITTLE ALL DAY BUT DEFINITELY MORE WHILE HE'S COMPLETING ACTIVITIES    Patient is receiving his medication from [facility] without difficulty.      Patient's next appointment with provider is scheduled for 10/12/21    Patient IS up to date on all labs required per protocol for medication.      Patient advised to call the office if any issues arise.    Page Spiro

## 2021-08-17 NOTE — Telephone Encounter (Signed)
PT CALLED BACK IN:  Med check for Oral Oncolytic:       Called pt to perform med check for oral oncolytic:  XTANDI      Are you taking your medication as prescribed?  YES, 2 TABS DAILY                Have you been checking your blood pressure or had it checked by another provider recently? GETTING CHECKED TODAY  If so, was it within normal limits? WILL TALK TO DR TODAY IF OUT OF NORMAL LIMITS     Have you noticed any new rash on any part of your body? NO    Have you experienced any swelling in your extremities? NO     Have you experienced any new or worsening pain in the last 30 days?  NO    Have you experienced any changes in your ability to perform your usual daily activities?  NO                                                                                        Have you experienced any significant changes in your weight or appetite?  NO     Have you experienced any significant changes in your ability to sleep?  NO     Have you experienced any increase in fatigue?  COULD BE DUE TO THE FLU SYMPTOMS IMPROVING    Patient is receiving his medication from [facility] without difficulty.      Patient's next appointment with provider is scheduled for 10/12/21    Patient IS up to date on all labs required per protocol for medication.      Patient advised to call the office if any issues arise.    Page Spiro

## 2021-08-17 NOTE — Telephone Encounter (Signed)
Attempted to reach out to Pt to inform him of Whichard's orders to monitor BP at home and to perform the med check due this week. LMOVM with call back number for PT to return my call.    Page Spiro

## 2021-08-26 NOTE — Telephone Encounter (Signed)
Med check for Oral Oncolytic:       Called pt to perform med check for oral oncolytic:  XTANDI      Are you taking your medication as prescribed?  YES, TWO TABS DAILY                Have you been checking your blood pressure or had it checked by another provider recently? YES  If so, was it within normal limits? 116/75     Have you noticed any new rash on any part of your body? NO    Have you experienced any swelling in your extremities? NO     Have you experienced any new or worsening pain in the last 30 days?  NO    Have you experienced any changes in your ability to perform your usual daily activities?  NO                                                                                        Have you experienced any significant changes in your weight or appetite?  NO     Have you experienced any significant changes in your ability to sleep?  NO     Have you experienced any increase in fatigue?  NO    Patient is receiving his medication from [facility] [facility] without difficulty.      Patient's next appointment with provider is scheduled for 10/15/21    Patient IS up to date on all labs required per protocol for medication.      Patient advised to call the office if any issues arise.    Page Spiro

## 2021-09-09 NOTE — Telephone Encounter (Signed)
Med check for Oral Oncolytic:       Called pt to perform med check for oral oncolytic:  XTANDI      Are you taking your medication as prescribed?  YES, TWO TABS DAILY                Have you been checking your blood pressure or had it checked by another provider recently? YES  If so, was it within normal limits? 124/74     Have you noticed any new rash on any part of your body? YES, A LITTLE ON THE INSIDE OF HIS THIGHS BUT IT IS GOING AWAY    Have you experienced any swelling in your extremities? NO     Have you experienced any new or worsening pain in the last 30 days?  NO    Have you experienced any changes in your ability to perform your usual daily activities?  NO                                                                                        Have you experienced any significant changes in your weight or appetite?  NO     Have you experienced any significant changes in your ability to sleep?  NO, STILL GETS UP FOR FREQUENT URINATION     Have you experienced any increase in fatigue?  NO    Patient is receiving his medication from [facility] without difficulty.      Patient's next appointment with provider is scheduled for 10/12/21    Patient IS up to date on all labs required per protocol for medication.      Patient advised to call the office if any issues arise.    Page Spiro

## 2021-09-09 NOTE — Telephone Encounter (Signed)
At the end of our call PT inquired  about his bone scan he was supposed to have done. Routing to N. Basnight as I see she was working on this prior.    Page Spiro

## 2021-09-22 ENCOUNTER — Encounter

## 2021-09-22 NOTE — Telephone Encounter (Signed)
Patient's RX needs to be sent out to [facility] as we are unable to fill it here in the office any longer. Patient has an interaction between his Xtandi and Plavix that does not seem to have been reviewed by an MD yet. Routing to Dr. Claiborne Billings to review advise.   Georgiana Spinner, LPN

## 2021-09-23 MED ORDER — ENZALUTAMIDE 40 MG CAPSULE
40 mg | ORAL_CAPSULE | ORAL | 5 refills | Status: DC
Start: 2021-09-23 — End: 2021-09-24

## 2021-09-23 NOTE — Telephone Encounter (Signed)
Error.

## 2021-09-24 MED ORDER — ENZALUTAMIDE 40 MG CAPSULE
40 mg | ORAL_CAPSULE | ORAL | 5 refills | Status: AC
Start: 2021-09-24 — End: ?

## 2021-09-24 NOTE — Addendum Note (Signed)
Addendum  Note by Burnell Blanks at 09/24/21 9794                Author: Burnell Blanks  Service: --  Author Type: Medical Assistant       Filed: 09/24/21 0949  Encounter Date: 09/22/2021  Status: Signed          Editor: Burnell Blanks (Medical Assistant)          Addended by: Burnell Blanks on: 09/24/2021 09:49 AM    Modules accepted: Orders

## 2021-09-28 ENCOUNTER — Encounter

## 2021-10-05 ENCOUNTER — Institutional Professional Consult (permissible substitution): Admit: 2021-10-05 | Discharge: 2021-10-05 | Payer: MEDICARE | Primary: Family

## 2021-10-05 DIAGNOSIS — C61 Malignant neoplasm of prostate: Secondary | ICD-10-CM

## 2021-10-05 LAB — PROSTATE SPECIFIC ANTIGEN, TOTAL (PSA)
PSA: <0.03 ng/mL (ref 0–4)
Prostate Specific Ag: <0.03 ng/mL (ref 0–4)

## 2021-10-05 LAB — TESTOSTERONE
Testosterone: <3 ng/dL — ABNORMAL LOW (ref 348–1197)
Testosterone: <3 ng/dL — ABNORMAL LOW (ref 348–1197)

## 2021-10-05 NOTE — Progress Notes (Signed)
Colton Cox presents today for lab draw per Dr. Claiborne Billings order.   Dr. Claiborne Billings was present in the clinic as incident to.     PSA, TST, BMP, and Vitamin D obtained via venipuncture without any difficulty.    Patient will be notified with lab results.       Orders Placed This Encounter    PROSTATE SPECIFIC ANTIGEN, TOTAL (PSA)    TESTOSTERONE    VITAMIN D, 25 HYDROXY    BASIC METABOLIC PANEL    PR COLLECTION VENOUS BLOOD VENIPUNCTURE       Aylssa Hooper

## 2021-10-07 LAB — METABOLIC PANEL, BASIC
BUN/Creatinine ratio: 24 (ref 10–24)
BUN: 29 mg/dL — ABNORMAL HIGH (ref 8–27)
CO2: 23 mmol/L (ref 20–29)
Calcium: 9.6 mg/dL (ref 8.6–10.2)
Chloride: 103 mmol/L (ref 96–106)
Creatinine: 1.2 mg/dL (ref 0.76–1.27)
Glucose: 108 mg/dL — ABNORMAL HIGH (ref 70–99)
Potassium: 4.5 mmol/L (ref 3.5–5.2)
Sodium: 140 mmol/L (ref 134–144)
eGFR: 62 mL/min/{1.73_m2} (ref >59)

## 2021-10-07 LAB — BASIC METABOLIC PANEL
BUN: 29 mg/dL — ABNORMAL HIGH (ref 8–27)
Bun/Cre Ratio: 24 NA (ref 10–24)
CO2: 23 mmol/L (ref 20–29)
Calcium: 9.6 mg/dL (ref 8.6–10.2)
Chloride: 103 mmol/L (ref 96–106)
Creatinine: 1.2 mg/dL (ref 0.76–1.27)
Est, Glomerular Filtration Rate: 62 mL/min/{1.73_m2} (ref >59)
Glucose: 108 mg/dL — ABNORMAL HIGH (ref 70–99)
Potassium: 4.5 mmol/L (ref 3.5–5.2)
Sodium: 140 mmol/L (ref 134–144)

## 2021-10-09 LAB — VITAMIN D, 25 HYDROXY
VITAMIN D, 25-HYDROXY: 15.9 ng/ml — CL (ref 30.0–100.0)
Vit D, 25-Hydroxy: 15.9 ng/ml — CL (ref 30.0–100.0)

## 2021-10-12 ENCOUNTER — Ambulatory Visit: Admit: 2021-10-12 | Discharge: 2021-10-12 | Payer: MEDICARE | Attending: Physician Assistant | Primary: Family

## 2021-10-12 ENCOUNTER — Ambulatory Visit: Attending: Physician Assistant | Primary: Medical

## 2021-10-12 DIAGNOSIS — C61 Malignant neoplasm of prostate: Secondary | ICD-10-CM

## 2021-10-12 MED ORDER — ELIGARD 22.5 MG (3 MONTH) SUBCUTANEOUS SYRINGE
22.5 mg | Freq: Once | SUBCUTANEOUS | 0 refills | Status: AC
Start: 2021-10-12 — End: 2021-10-12

## 2021-10-12 MED ORDER — ERGOCALCIFEROL (VITAMIN D2) 50,000 UNIT CAP
1250 mcg (50,000 unit) | ORAL_CAPSULE | ORAL | 0 refills | Status: AC
Start: 2021-10-12 — End: ?

## 2021-10-12 NOTE — Progress Notes (Signed)
Progress Notes by Phillis Knack, PA-C at 10/12/21 1020                Author: Phillis Knack, PA-C  Service: --  Author Type: Physician Assistant       Filed: 10/12/21 1626  Encounter Date: 10/12/2021  Status: Signed          Editor: Phillis Knack, PA-C (Physician Assistant)                       Colton Cox   DOB 1946/05/22   76 y.o.      PROSTATE CANCER ESTABLISHED DIAGNOSIS        Encounter Diagnoses              ICD-10-CM  ICD-9-CM          1.  Malignant neoplasm of prostate ([place])   C61  185          2.  Lymphadenopathy   R59.1  785.6              Assessment:   - 76 y.o. male with cT1cNxM0 GS 4+3, Prostate cancer in 9/12 cores, prostate volume of   49.57 cm3, Pre-biopsy PSA= 3.75ng/mL.      Bone scan 12/22/15: negative for metastatic prostate cancer.    CTU 2/17: negative    ADT initiated on 02/06/16 with Eligard 45 mg injection.  Single dose.    Completed XRT 03/18/16-04/23/16 with Dr. Tobe Sos     PSA nadir: 0.11 ng/ml (06/2018) with PSA back to baseline    DRE 03/21/2019: No evidence recurrence    PSMA PET 05/22/21: PyL binding in left internal iliac and right retrocrural lymph nodes compatible with metastatic prostate malignancy.    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 Transition to Eligard 22.5 mg    DEXA scan 09/24/20: Major osteoporotic compression fracture risk: 7.3%, Hip fracture risk: >3%    Completed salvage IMRT to the pelvic LN with Dr. Jethro Bolus on 08/20/21. Now planning for SBRT to PSMA positive right retrocrural LN.       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 Taking Flomax 0.8 mg daily with no significant change compared to 0.4 mg        - History of microscopic hematuria, 1+ blood and 5-10 RBC by UA on 11/11/15.    Urine cytology 10/2015 showed rare atypical urothelial cells.     CT Urogram 11/21/15 showed slightly attenuated and perhaps slightly thickened  mid-distal right ureter/equivocally strictured, but without proximal dilatation. 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       - History of TIA on Plavix 75 mg and aspirin 81 mg daily      - History of tobacco use      Plan:   ??  Reviewed most recent labs 10/05/21: PSA <0.03ng/ml, T <3, Vit D 15.9   ??  Ergocalciferol 50,000 units weekly x 8 weeks sent to pharmacy to replete. Patient to resume daily Vit D2 or D3, 2,000 units daily following this.    ??  Reviewed recent DEXA scan: Major osteoporotic compression fracture risk: 7.3%, Hip fracture risk: 2.4%. No Prolia indicated.     ??  Continue ADT, Eligard 22.5 mg given  today . Plan is for 24 months of ADT.    ??  Continue Xtandi 2 tabs daily    ??  Trial black cohosh for hot flashes.    ??  Follow-up in 4 months with PSA, T, Vit D prior with [employer], PA-C.       I am following the established plan for Dr. Claiborne Billings and Dr. Alphonsa Overall is the supervising physician for this day.            Chief Complaint:      Chief Complaint       Patient presents with        ?  Prostate Cancer                 HPI: Colton Cox  76 y.o. MALE who returns for follow-up of his prostate cancer cT1cNxM0 GS 4+3 (Dx 12/12/15), Prostate cancer in 9/12 cores, prostate volume of  49.57 cm3, Pre-biopsy PSA= 3.75ng/mL s/p ADT and XRT completed  04/2016 and BPH s/p ProLEP 12/2015.       He does complain of some hot flashes - has not trialed black cohosh for this. Reports mild intertriginous rash which he feels is related to excessive night sweats - resolving with lotion. Denies gross hematuria or dysuria. Continues on Flomax 0.4 mg.  Taking Xtandi 2 tabs daily with no noticeable new side effects.      Reports he thought he had a stroke ~8 years ago. Started plavix at this time.    Denies any episodes of seizures.            Past Medical History:        Diagnosis  Date         ?  Cerebral artery occlusion with  cerebral infarction [facility])       ?  Colon polyps       ?  Hypertension           ?  Personal history of prostate cancer               Past Surgical History:         Procedure  Laterality  Date          ?  HX COLONOSCOPY    01/2018     ?  HX HERNIA REPAIR         ?  HX OTHER SURGICAL    06/2016          Cat Scan: spot noted on lung          ?  HX UROLOGICAL  N/A  01/14/2016          Cysto, Right RPG, and Protouch laser enucleation of the prostate with tissue morcellation - Dr. Claiborne Billings          ?  PR APPENDECTOMY                 Social History          Tobacco Use         ?  Smoking status:  Former     ?  Smokeless tobacco:  Former              Quit date:  09/20/1988       Substance Use Topics         ?  Alcohol use:  Yes         ?  Drug use:  No  Allergies        Allergen  Reactions         ?  Bee Sting  [Sting, Bee]  Shortness of Breath             Family History         Problem  Relation  Age of Onset          ?  Colon Cancer  Mother            ?  Colon Cancer  Father               Current Outpatient Medications          Medication  Sig  Dispense  Refill           ?  iron bis-gly/FA/C/B12/Ca/succ (IRON-150 PO)  Take  by mouth.         ?  leuprolide (Eligard, 3 month,) 22.5 mg syrg injection  1 Syringe by SubCUTAneous route once for 1 dose.  1 Each  0     ?  ergocalciferol (ERGOCALCIFEROL) 1,250 mcg (50,000 unit) capsule  Take 1 Capsule by mouth every seven (7) days.  8 Capsule  0     ?  enzalutamide (XTANDI) 40 mg capsule  Take two capsules by mouth daily at bedtime  60 Capsule  5     ?  tamsulosin (FLOMAX) 0.4 mg capsule  Take 2 Capsules by mouth daily (after dinner).  180 Capsule  3     ?  losartan (COZAAR) 50 mg tablet           ?  hydroCHLOROthiazide (MICROZIDE) 12.5 mg capsule           ?  EPIPEN 2-PAK 0.3 mg/0.3 mL injection           ?  ibuprofen (MOTRIN) 800 mg tablet           ?  meloxicam (MOBIC) 15 mg tablet  Take 15 mg by mouth daily.         ?  clopidogreL (PLAVIX) 75 mg tab  Take  by mouth.          ?  calcium cit-vit D3-isoflavon 2 100-1,000-80 mg-unit-mg tab  Take  by mouth.         ?  cholecalciferol, vitamin D3, 50 mcg (2,000 unit) tab  Take  by mouth.         ?  multivitamin (ONE A DAY) tablet  Take 1 Tablet by mouth daily.               ?  simvastatin (ZOCOR) 20 mg tablet                 Review of Systems:   Constitutional: Fever: No   Skin: Rash: No   HEENT: Hearing difficulty: No   Eyes: Blurred vision: No   Cardiovascular: Chest pain: No   Respiratory: Shortness of breath: No   Gastrointestinal: Nausea/vomiting: No   Musculoskeletal: Back pain: No   Neurological: Weakness: No   Psychological: Memory loss: No   Comments/additional findings:    All other systems reviewed and are negative.      Physical Exam:   Visit Vitals      Ht  5\' 11"  (1.803 m)     Wt  237 lb (107.5 kg)        BMI  33.05 kg/m??              Constitutional: WDWN,  Pleasant and appropriate affect, No acute distress.     Respiratory: No respiratory distress or difficulties   Skin: No jaundice.     Neuro/Psych:  Alert and o riented. Affect appropriate.        Imaging:   NM Bone Scan [facility] 12/22/2015   IMPRESSION:  No bone scan evidence of osseous metastatic disease.       PSMA PET Scan 05/22/21   IMPRESSION   1. PyL binding in left internal iliac and right retrocrural lymph nodes compatible with metastatic prostate malignancy.       Pathology:    Surgical Pathology 01/14/2016   FINAL DIAGNOSIS:    PROSTATE, TRANSURETHRAL RESECTION:    BENIGN NODULAR HYPERPLASIA      Prostate Biopsy Pathology:       result  Gleason Grade  cores          12/12/2015  4+3= 7  POS:9  Total:12        Biopsy Pathology 12/12/2015   DIAGNOSIS:   A. Right Apex Needle biopsy                  Benign prostatic tissue.   B. Right Mid Needle biopsy                  Benign prostatic tissue.   C. Right  Base Needle biopsy                  ADENOCARCINOMA, GLEASON SCORE 3 + 3 = 6 involving 5 % of the specimen (1 of 1 core(s) positive).                Grade Group 1. Ends not  involved by the tumor. Perineural invasion.   D. Right  Lateral Apex Needle biopsy                  Benign prostatic tissue.   E. Right Lateral Mid Needle biopsy                  ADENOCARCINOMA, GLEASON SCORE 3 + 3 = 6 involving 10 % of the specimen (1 of 1 core(s) positive).                 Grade Group 1. Ends not involved by the tumor.   F. Right Lateral Base Needle biopsy                  ADENOCARCINOMA, GLEASON SCORE 3 + 3 = 6 involving 5 % of the specimen (1 of 1 core(s) positive).                 Grade Group 1. Ends not involved by the tumor.   G. Left Apex Needle biopsy                  ADENOCARCINOMA, GLEASON SCORE 4 + 3 = 7 involving 20 % of the specimen, discontinuous (1 of 1 core(s) positive).                 Gleason 4 comprises 70 % of the cancer. Grade Group 3. Ends not involved by the tumor.   H. Left Mid Needle biopsy                  ADENOCARCINOMA, GLEASON SCORE 3 + 4 = 7 involving 60 % of the specimen, discontinuous (1 of 1 core(s)  positive).  Gleason 4 comprises 20 % of the cancer. Grade Group 2. Tumor involves one end.   I. Left Base Needle biopsy                  ADENOCARCINOMA, GLEASON SCORE 4 + 3 = 7 involving 80 % of the specimen, discontinuous  (1 of 1 core(s) positive).                Gleason 4 comprises 70 % of the cancer. Grade Group 3. Tumor involves one end. Perineural invasion.   J. Left Lateral Apex Needle biopsy                  ADENOCARCINOMA, GLEASON SCORE  3 + 4 = 7 involving 20 % of the specimen, discontinuous (1 of 1 core(s) positive).                Gleason 4 comprises 40 % of the cancer. Grade Group 2. Ends not involved by the tumor.   K. Left Lateral Mid Needle biopsy                   ADENOCARCINOMA, GLEASON SCORE 4 + 3 = 7 involving 60 % of the specimen (3 of 3 core(s) positive).                Gleason 4 comprises 70 % of the cancer. Grade Group 3. Tumor involves one end. Perineural invasion.   L. Left  Lateral Base Needle biopsy                   ADENOCARCINOMA, GLEASON SCORE 4 + 3 = 7 involving 95 % of the specimen (1 of 1 core(s) positive).                Gleason 4 comprises 70 % of the cancer. Grade Group 3. Ends not involved  by the tumor. Perineural invasion.       Labs reviewed:    No results found for this or any previous visit (from the past 24 hour(s)).           PSA /TESTOSTERONE - [facility]  PSA  Testosterone, total     Latest Ref Rng & Units  0 - 4 ng/mL  348 - 1197 ng/dL         06/17/2021  2.29  203     04/13/2021  2.16  187     09/26/2020  1.49       03/13/2020  1.16       12/20/2019  0.99       09/19/2019  1.39  270         06/21/2019  1.06        03/21/2019  0.66  208     12/27/2018  0.42       09/26/2018  0.25  187     07/20/2018  0.11  215     03/14/2018  0.11  184     10/05/2017  0.08  131     04/26/2017  0.04  51     01/25/2017  <0.03           10/26/2016  <0.03  <3 (L)     08/03/2016  <0.03  <3 (L)         11/03/2015  3.75           08/07/2015  5.40 (H)             Adlene Adduci PA-C  [facility]         CC:   Henry Russel, FNP    CC: Dr. Roger Kill

## 2021-10-12 NOTE — Progress Notes (Signed)
Colton Cox is a 76 y.o. male who is here today per the order of Phillis Knack, PA-C to receive Eligard .   Patient's identity has been verified.      Patient has been taking supplements of at least 500 mg of calcium and 400 international units of Vitamin D daily.  This is not his first ADT injection.      Eligard 22.5mg  is administered to abdomen ( left) SQ without difficulty.     Patient tolerated the injection well.    Patient has been scheduled for the next injection which will be due in approximately 3 months  Patient has a follow-up appointment with provider on 01/27/2022      Encounter Diagnosis   Name Primary?    Malignant neoplasm of prostate ([place]) Yes       Orders Placed This Encounter    [place] SUB-Q/IM    LEUPROLIDE ACETATE SUSPNSION     Order Specific Question:   Charge Quantity?     Answer:   3     Order Specific Question:   Dose     Answer:   22.5mg      Order Specific Question:   Site     Answer:   LEFT LOWER QUAD ABDOMEN     Order Specific Question:   Expiration Date     Answer:   03/20/2023     Order Specific Question:   Lot#     Answer:   [id number]     Order Specific Question:   Manufacturer     Answer:   tolmar     Order Specific Question:   Perfomed by/Witnessed by:     Answer:   eb     Order Specific Question:   NDC#     Answer:   [id number] [[id number]]    iron bis-gly/FA/C/B12/Ca/succ (IRON-150 PO)     Sig: Take  by mouth.    leuprolide (Eligard, 3 month,) 22.5 mg syrg injection     Sig: 1 Syringe by SubCUTAneous route once for 1 dose.     Dispense:  1 Each     Refill:  0    ergocalciferol (ERGOCALCIFEROL) 1,250 mcg (50,000 unit) capsule     Sig: Take 1 Capsule by mouth every seven (7) days.     Dispense:  8 Capsule     Refill:  0       Patient did not supply own medication.    Phillip Heal

## 2021-10-23 NOTE — Telephone Encounter (Signed)
Med check for Oral Oncolytic:       Called pt to perform med check for oral oncolytic:  xtandi      Are you taking your medication as prescribed?  yes, two tabs daily                Have you been checking your blood pressure or had it checked by another provider recently? yes  If so, was it within normal limits? yes     Have you noticed any new rash on any part of your body? no    Have you experienced any swelling in your extremities? no     Have you experienced any new or worsening pain in the last 30 days?  no    Have you experienced any changes in your ability to perform your usual daily activities?  no                                                                                        Have you experienced any significant changes in your weight or appetite?  no     Have you experienced any significant changes in your ability to sleep?  no     Have you experienced any increase in fatigue?  no    Patient is receiving his medication from [facility] without difficulty.      Patient's next appointment with provider is scheduled for 01/27/22    Patient is up to date on all labs required per protocol for medication.      Patient advised to call the office if any issues arise.    Colton Cox

## 2021-11-03 NOTE — Telephone Encounter (Signed)
Patient has xtandi ready for pick up in the dispensary. Copay $14.00. I have left a message for the patient to call the dispensary. I will answer any questions upon a return phone call.      Kerry Dory

## 2021-11-10 NOTE — Telephone Encounter (Signed)
Spoke to patient and he is getting his xtandi through [facility] and would like to continue getting it through them. Will return to stock the medication we have here. Kathryne Hitch

## 2021-11-20 NOTE — Telephone Encounter (Signed)
Med check for Oral Oncolytic:       Called pt to perform med check for oral oncolytic:  Xtandi      Are you taking your medication as prescribed?  yes, two tabs daily                Have you been checking your blood pressure or had it checked by another provider recently? yes  If so, was it within normal limits? pt states it was a little high.     Have you noticed any new rash on any part of your body? no    Have you experienced any swelling in your extremities? no     Have you experienced any new or worsening pain in the last 30 days?  no    Have you experienced any changes in your ability to perform your usual daily activities?  no                                                                                        Have you experienced any significant changes in your weight or appetite?  no     Have you experienced any significant changes in your ability to sleep?  no     Have you experienced any increase in fatigue?  pt states fatigue is getting better    Patient is receiving his medication from [facility] without difficulty. Patient has contact information for specialty pharmacy.     Patient has a week and a half left medication left. Pt was advised to obtain a refill.      Patient's next appointment with provider is scheduled for 01/27/22    Patient is up to date on all labs required per protocol for medication.      Patient advised to call the office if any issues arise.    Page Spiro, MA

## 2021-12-22 NOTE — Telephone Encounter (Signed)
Med check for Oral Oncolytic:       Called pt to perform med check for oral oncolytic:  Xtandi      Are you taking your medication as prescribed?  yes. Two tabs daily                Have you been checking your blood pressure or had it checked by another provider recently? yes  If so, was it within normal limits? yes     Have you noticed any new rash on any part of your body? no    Have you experienced any swelling in your extremities? no     Have you experienced any new or worsening pain in the last 30 days?  no    Have you experienced any changes in your ability to perform your usual daily activities?  no                                                                                        Have you experienced any significant changes in your weight or appetite?  no     Have you experienced any significant changes in your ability to sleep?  no     Have you experienced any increase in fatigue?  no    Patient is receiving his medication from [facility] without difficulty. Patient has contact information for specialty pharmacy.     Patient has eight days of medication left. Pt already called in for a refill yesterday.      Patient's next appointment with provider is scheduled for 01/27/22    Patient is up to date on all labs required per protocol for medication.      Patient advised to call the office if any issues arise.    Page Spiro, MA

## 2022-01-05 ENCOUNTER — Encounter: Primary: Family

## 2022-01-05 ENCOUNTER — Encounter: Admit: 2022-01-05 | Discharge: 2022-01-06 | Payer: MEDICARE | Primary: Family

## 2022-01-05 DIAGNOSIS — C61 Malignant neoplasm of prostate: Secondary | ICD-10-CM

## 2022-01-05 MED ORDER — LEUPROLIDE ACETATE (3 MONTH) 22.5 MG SC KIT
22.5 MG | PACK | Freq: Once | SUBCUTANEOUS | 0 refills | Status: AC
Start: 2022-01-05 — End: 2022-01-05

## 2022-01-05 NOTE — Progress Notes (Unsigned)
Colton Cox is a 76 y.o. male who is here today per the order of [facility] to receive Eligard 22.5 mg   Patient's identity has been verified.   Dr. Tresa Endo was available in the clinic as incident to provider.     Patient has been taking supplements of at least 500 mg of calcium and 400 international units of Vitamin D daily.  This is not his first ADT injection.      Eligard 22.5 mg  is administered to abdomen ( right) SQ without difficulty.     Patient tolerated the injection well.    Patient has been scheduled for the next injection which will be due in approximately 3 months  Patient has a follow-up appointment with provider on Jan 27, 2022      Encounter Diagnosis   Name Primary?    Malignant neoplasm of prostate ([facility]) Yes       Orders Placed This Encounter   Procedures    PR CHEMOTHER [place] ANTINEOPL SUB-Q/IM    PR LEUPROLIDE ACETATE SUSPNSION     Order Specific Question:   Dose     Answer:   22.5 mg     Order Specific Question:   Site     Answer:   RIGHT LOWER QUAD ABDOMEN     Order Specific Question:   Expiration Date     Answer:   04/07/2023     Order Specific Question:   Lot#     Answer:   [id number]     Order Specific Question:   Manufacturer     Answer:   [employer]     Order Specific Question:   Charge Quantity?     Answer:   3     Order Specific Question:   Perfomed by/Witnessed by:     Answer:   TB     Order Specific Question:   NDC#     Answer:   [id number] [[id number]]       Patient did not supply own medication.      Penny Pia, MA

## 2022-01-20 ENCOUNTER — Encounter: Primary: Family

## 2022-01-20 ENCOUNTER — Encounter: Admit: 2022-01-20 | Payer: MEDICARE | Primary: Family

## 2022-01-20 DIAGNOSIS — C61 Malignant neoplasm of prostate: Secondary | ICD-10-CM

## 2022-01-20 LAB — PROSTATE SPECIFIC ANTIGEN, TOTAL: PSA: <0.03 ng/mL (ref 0–4)

## 2022-01-20 LAB — TESTOSTERONE: Testosterone: <3 ng/dL — ABNORMAL LOW (ref 348–1197)

## 2022-01-20 NOTE — Progress Notes (Signed)
Colton Cox presents today for lab draw per [facility] order.   Dr. Hassell Done was present in the clinic as incident to.     PSA, TST, and Vitamin D obtained via venipuncture without any difficulty.    Patient will be notified with lab results.       Orders Placed This Encounter   Procedures    Prostate Specific Antigen, Total    Testosterone    Vitamin D, 25 Hydroxy    PR COLLECTION VENOUS BLOOD VENIPUNCTURE       Kandace Parkins, MA

## 2022-01-21 NOTE — Telephone Encounter (Signed)
Spoke with the patient. He states that his hot flashes have been getting worse and worse for the past couple of weeks and in that time frame, he has noticed a raised, bumpy rash pop up. He states it started on his back, then spread to his neck/chin area, and yesterday he noticed bumps and localized redness on his chest. He states that the redness on his chest has gone away, but he still has the bumps on his back and neck, and systemic itching. Patient states he has been taking Allegra to help with the itching and has been using OTC anti-itch creams. Advised that he try hydrocortisone as that can sometimes help, and advised that when the itching gets very bad, cool water can help and recommended a few minutes in a cool shower to lessen response. Patient expressed understanding. Patient has an upcoming appt with PA Noell on 05/10, recommended strongly that he keep that appointment so she can physically assess him. He states that he will be here, Patient is currently on a 1/2 dose of Xtandi, 2 tabs per day. Routing to providers to see if there's anything they would like [place] to do between now and the patient's appointment.   Georgiana Spinner, LPN

## 2022-01-21 NOTE — Telephone Encounter (Signed)
Med check for Oral Oncolytic:       Called pt to perform med check for oral oncolytic:  Xtandi      Are you taking your medication as prescribed?  yes, two tabs daily                Have you been checking your blood pressure or had it checked by another provider recently? yes  If so, was it within normal limits? yes     Have you noticed any new rash on any part of your body? pt has had a rash on his chest, neck, and back. Pt is not sure if its due to him sweating from hotflashes or xtandi. Has been going on about a month now.     Have you experienced any swelling in your extremities? no     Have you experienced any new or worsening pain in the last 30 days?  no    Have you experienced any changes in your ability to perform your usual daily activities?  no                                                                                        Have you experienced any significant changes in your weight or appetite?  no     Have you experienced any significant changes in your ability to sleep?  no     Have you experienced any increase in fatigue?  no    Patient is receiving his medication from [facility] without difficulty. Patient has contact information for specialty pharmacy.     Patient has two weeks of medication left. Pt was advised to obtain a refill.      Patient's next appointment with provider is scheduled for 01/27/22    Patient is up to date on all labs required per protocol for medication.      Patient advised to call the office if any issues arise.    Page Spiro, MA

## 2022-01-21 NOTE — Telephone Encounter (Signed)
Advised patient to hold his Xtandi until his upcoming appt per PA Noell. Patient expressed understanding. Patient will call with any concerns in the meantime.  Georgiana Spinner, LPN

## 2022-01-22 LAB — VITAMIN D, 25 HYDROXY: Vit D, 25-Hydroxy: 14.7 ng/ml — CL (ref 30.0–100.0)

## 2022-01-27 ENCOUNTER — Encounter: Attending: Physician Assistant | Primary: Family

## 2022-01-27 ENCOUNTER — Ambulatory Visit: Admit: 2022-01-27 | Discharge: 2022-01-27 | Payer: MEDICARE | Attending: Physician Assistant | Primary: Family

## 2022-01-27 DIAGNOSIS — C61 Malignant neoplasm of prostate: Secondary | ICD-10-CM

## 2022-01-27 MED ORDER — VITAMIN D (ERGOCALCIFEROL) 1.25 MG (50000 UT) PO CAPS
1.25 MG (50000 UT) | ORAL_CAPSULE | ORAL | 3 refills | Status: DC
Start: 2022-01-27 — End: 2022-04-20

## 2022-01-27 MED ORDER — MEGESTROL ACETATE 20 MG PO TABS
20 MG | ORAL_TABLET | Freq: Every day | ORAL | 0 refills | Status: AC
Start: 2022-01-27 — End: 2022-02-22

## 2022-01-27 NOTE — Progress Notes (Signed)
[name]  DOB October 16, 1945  01/27/2022    ASSESSMENT:    ICD-10-CM    1. 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 Vitamin D deficiency, unspecified  E55.9       3. Rash  R21           Upstaged [id number] hormone-sensitive prostate cancer  cT1cNxM0 GS 4+3, Prostate cancer in 9/12 cores, prostate volume of  49.57 cm3, Pre-biopsy PSA= 3.75ng/mL.                Bone scan 12/22/15: negative for metastatic prostate cancer.   CTU 2/17: negative              ADT initiated on 02/06/16 with Eligard 45 mg injection.  Single dose.              Completed XRT 03/18/16-04/23/16 with Dr. Tobe Sos               PSA nadir: 0.11 ng/ml (06/2018) with PSA back to baseline              DRE 03/21/2019: No evidence recurrence              PSMA PET 05/22/21: PyL binding in left internal iliac and right retrocrural lymph nodes compatible with metastatic prostate malignancy.              Restarted ADT on 06/17/21 with Degeralix 240 mg inj. Transition to Eligard 22.5 mg with most recent injection 01/05/22   Started Xtandi 07/2021 at 50% dosage 2/2 plavix usage              DEXA scan 09/24/20: Major osteoporotic compression fracture risk: 7.3%, Hip fracture risk: >3%   Completed salvage IMRT to the pelvic LN with Dr. Jethro Bolus on 08/20/21 and SBRT to PSMA positive right retrocrural LN 10/15/2021  Most recent PSA <0.03 ng/ml (01/20/22) with T castrate     - BPH s/p ProTouch laser enucleation of the prostate with tissue morcellation on 01/14/2016. Taking Flomax 0.8 mg daily with no significant change compared to 0.4 mg                 - History of microscopic hematuria, 1+ blood and 5-10 RBC by UA on 11/11/15.              Urine cytology 10/2015 showed rare atypical urothelial cells.               CT Urogram 11/21/15 showed slightly attenuated and perhaps slightly thickened mid-distal right ureter/equivocally strictured, but without proximal dilatation. Rretrograde ureterography in OR 4/26 w/o evidence of filling  defect              No gross hematuria     - Vit D Deficiency with most recent Vit D 14.7 (01/20/22)   DEXA scan (09/24/21): Major osteoporotic compression fracture risk: 7.3%, Hip fracture risk: 2.4%. No Prolia indicated.       - History of TIA on Plavix 75 mg and aspirin 81 mg daily     - History of tobacco use       PLAN:  Reviewed most recent labs  Recommend restart Ergocalciferol 50,000 units weekly with refills sent to pharmacy   Continue ADT with Eligard 22.5 mg q 3 months. Plan is for 24 months of ADT.   Hold Xtandi 2 tabs daily for now  Trial megace 20 mg daily x  30 days with indications, possible side effects and dosing instructions reviewed  Will see if elimination/reduction of hot flashes helps skin issues  Plan to resume Xtandi in 4 weeks if symptoms resolve  If symptoms persist off med, recommend referral to dermatology  Continue symptomatic management of rash with antihistamines and topical steroid  Follow-up in 4 weeks for reassessment     The patient was seen under the supervision of Dr. Hassell Done today.      Chief Complaint   Patient presents with    Prostate Cancer       HPI  Colton Cox is a 76 y.o. male who returns for follow-up of his prostate cancer cT1cNxM0 GS 4+3 (Dx 12/12/15), Prostate cancer in 9/12 cores, prostate volume of  49.57 cm3, Pre-biopsy PSA= 3.75ng/mL s/p ADT and XRT completed 04/2016 and BPH s/p ProLEP 12/2015.     Patient reports that his hot flashes have been getting worse and worse for the past couple of weeks and in that time frame, he has noticed a raised, bumpy rash pop up. He states it started on his back, then spread to his neck/chin area, and then noticed bumps and localized redness on his chest. This waxes and wanes. Patient states he has been taking Allegra to help with the itching and has been using OTC anti-itch creams. He wonders if related to hot flashes as he gets very sweaty and rash is variable and he has been taking xtandi for 6 months. Black cohosh is not helping.      Reports he thought he had a stroke ~8 years ago. Started plavix at this time.   Denies any episodes of seizures.     Past Medical History:   Diagnosis Date    Cerebral artery occlusion with cerebral infarction [facility])     Colon polyps     Hypertension     Personal history of prostate cancer        Past Surgical History:   Procedure Laterality Date    COLONOSCOPY  01/2018    HERNIA REPAIR      OTHER SURGICAL HISTORY  06/2016    Cat Scan: spot noted on lung    PR APPENDECTOMY      UROLOGICAL SURGERY N/A 01/14/2016    Cysto, Right RPG, and Protouch laser enucleation of the prostate with tissue morcellation - Dr. Claiborne Billings       Current Outpatient Medications   Medication Sig Dispense Refill    megestrol (MEGACE) 20 MG tablet Take 1 tablet by mouth daily 30 tablet 0    vitamin D (ERGOCALCIFEROL) 1.25 MG (50000 UT) CAPS capsule Take 1 capsule by mouth once a week 12 capsule 3    Cholecalciferol 50 MCG (2000 UT) TABS Take by mouth      clopidogrel (PLAVIX) 75 MG tablet Take by mouth      EPINEPHrine (EPIPEN) 0.3 MG/0.3ML SOAJ injection ceived the following from [facility]: Outside name: EPIPEN 2-PAK 0.3 mg/0.3 mL injection      hydroCHLOROthiazide (MICROZIDE) 12.5 MG capsule ceived the following from [facility]: Outside name: hydroCHLOROthiazide (MICROZIDE) 12.5 mg capsule      losartan (COZAAR) 50 MG tablet ceived the following from [facility]: Outside name: losartan (COZAAR) 50 mg tablet      meloxicam (MOBIC) 15 MG tablet Take 1 tablet by mouth daily      simvastatin (ZOCOR) 20 MG tablet ceived the following from [facility]:  Outside name: simvastatin (ZOCOR) 20 mg tablet      tamsulosin (FLOMAX) 0.4 MG capsule Take 2 capsules by mouth      Leuprolide Acetate, 3 Month, (ELIGARD) 22.5 MG KIT Inject 22.5 mg into the skin once for 1 dose 1 kit 0    enzalutamide (XTANDI) 40 MG capsule Take two capsules by mouth daily at bedtime (Patient not taking: Reported on  01/27/2022)      ibuprofen (ADVIL;MOTRIN) 800 MG tablet ceived the following from [facility] [facility]: Outside name: ibuprofen (MOTRIN) 800 mg tablet (Patient not taking: Reported on 01/27/2022)       No current facility-administered medications for this visit.       Allergies   Allergen Reactions    Bee Venom Shortness Of Breath       Family History   Problem Relation Age of Onset    Colon Cancer Father     Colon Cancer Mother        Social History     Socioeconomic History    Marital status: Married     Spouse name: None    Number of children: None    Years of education: None    Highest education level: None   Tobacco Use    Smoking status: Former    Smokeless tobacco: Former     Quit date: 09/20/1988   Substance and Sexual Activity    Alcohol use: Yes    Drug use: No         PHYSICAL EXAM:  There were no vitals filed for this visit.    GEN: NAD, alert and oriented  PULM: nl resp effort, no distress  PSYCH: Nl mood and affect  SKIN: Diffuse papular scattered erythematous diffuse rash over chest, intertriginous regions, back    LABS:  No results found for this visit on 01/27/22.    PSA Trend     PSA /TESTOSTERONE - B[facility]SI PSA Testosterone, total   Latest Ref Rng & Units 0 - 4 ng/mL 348 - 1197 ng/dL   01/20/2022 <0.03 < 3   10/05/2021 <0.03    06/17/2021 2.29 203   04/13/2021 2.16 187   09/26/2020 1.49     03/13/2020 1.16     12/20/2019 0.99     09/19/2019 1.39 270   06/21/2019 1.06      03/21/2019 0.66 208   12/27/2018 0.42     09/26/2018 0.25 187   07/20/2018 0.11 215   03/14/2018 0.11 184   10/05/2017 0.08 131   04/26/2017 0.04 51   01/25/2017 <0.03     10/26/2016 <0.03 <3 (L)   08/03/2016 <0.03 <3 (L)   11/03/2015 3.75     08/07/2015 5.40 (H)       Lab Results   Component Value Date/Time    VITD25 14.7 01/20/2022 10:45 AM         Surgical Pathology 01/14/2016  FINAL DIAGNOSIS:   PROSTATE, TRANSURETHRAL RESECTION:   BENIGN NODULAR HYPERPLASIA     Prostate Biopsy Pathology:        result Gleason Grade cores   12/12/2015 4+3= 7 POS:9  Total:12      Biopsy Pathology 12/12/2015  DIAGNOSIS:   A. Right Apex Needle biopsy                  Benign prostatic tissue.   B. Right Mid Needle biopsy                  Benign prostatic  tissue.   C. Right Base Needle biopsy                  ADENOCARCINOMA, GLEASON SCORE 3 + 3 = 6 involving 5 % of the specimen (1 of 1 core(s) positive).                Grade Group 1. Ends not involved by the tumor. Perineural invasion.   D. Right Lateral Apex Needle biopsy                  Benign prostatic tissue.   E. Right Lateral Mid Needle biopsy                  ADENOCARCINOMA, GLEASON SCORE 3 + 3 = 6 involving 10 % of the specimen (1 of 1 core(s) positive).                Grade Group 1. Ends not involved by the tumor.   F. Right Lateral Base Needle biopsy                  ADENOCARCINOMA, GLEASON SCORE 3 + 3 = 6 involving 5 % of the specimen (1 of 1 core(s) positive).                Grade Group 1. Ends not involved by the tumor.   G. Left Apex Needle biopsy                  ADENOCARCINOMA, GLEASON SCORE 4 + 3 = 7 involving 20 % of the specimen, discontinuous (1 of 1 core(s) positive).                Gleason 4 comprises 70 % of the cancer. Grade Group 3. Ends not involved by the tumor.   H. Left Mid Needle biopsy                  ADENOCARCINOMA, GLEASON SCORE 3 + 4 = 7 involving 60 % of the specimen, discontinuous (1 of 1 core(s) positive).                Gleason 4 comprises 20 % of the cancer. Grade Group 2. Tumor involves one end.   I. Left Base Needle biopsy                  ADENOCARCINOMA, GLEASON SCORE 4 + 3 = 7 involving 80 % of the specimen, discontinuous (1 of 1 core(s) positive).                Gleason 4 comprises 70 % of the cancer. Grade Group 3. Tumor involves one end. Perineural invasion.   J. Left Lateral Apex Needle biopsy                  ADENOCARCINOMA, GLEASON SCORE 3 + 4 = 7 involving 20 % of the specimen, discontinuous (1 of 1 core(s) positive).                Gleason 4 comprises 40 % of the cancer. Grade  Group 2. Ends not involved by the tumor.   K. Left Lateral Mid Needle biopsy                  ADENOCARCINOMA, GLEASON SCORE 4 + 3 = 7 involving 60 % of the specimen (3 of 3 core(s) positive).                Gleason 4 comprises  70 % of the cancer. Grade Group 3. Tumor involves one end. Perineural invasion.   L. Left Lateral Base Needle biopsy                  ADENOCARCINOMA, GLEASON SCORE 4 + 3 = 7 involving 95 % of the specimen (1 of 1 core(s) positive).                Gleason 4 comprises 70 % of the cancer. Grade Group 3. Ends not involved by the tumor. Perineural invasion.     RADIOLOGY:    PSMA PET Scan 05/22/21  IMPRESSION   1. PyL binding in left internal iliac and right retrocrural lymph nodes compatible with metastatic prostate malignancy.    NM Bone Scan [facility] Body 12/22/2015  IMPRESSION:  No bone scan evidence of osseous metastatic disease.         Teodora Medici PA-C  [facility]     QA:[id number] BAILEY, APRN - NP

## 2022-02-23 MED ORDER — MEGESTROL ACETATE 20 MG PO TABS
20 MG | ORAL_TABLET | Freq: Every day | ORAL | 0 refills | Status: DC
Start: 2022-02-23 — End: 2022-04-13

## 2022-03-02 ENCOUNTER — Ambulatory Visit: Admit: 2022-03-02 | Discharge: 2022-03-02 | Payer: MEDICARE | Attending: Physician Assistant | Primary: Family

## 2022-03-02 DIAGNOSIS — C61 Malignant neoplasm of prostate: Secondary | ICD-10-CM

## 2022-03-02 NOTE — Progress Notes (Signed)
Colton Cox  DOB 1946-07-05  03/02/2022    ASSESSMENT:    ICD-10-CM    1. 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 Hot flashes  R23.2           Upstaged [id number] hormone-sensitive prostate cancer  cT1cNxM0 GS 4+3, Prostate cancer in 9/12 cores, prostate volume of  49.57 cm3, Pre-biopsy PSA= 3.75ng/mL.                Bone scan 12/22/15: negative for metastatic prostate cancer.   CTU 2/17: negative              ADT initiated on 02/06/16 with Eligard 45 mg injection.  Single dose.              Completed XRT 03/18/16-04/23/16 with Dr. Tobe Sos               PSA nadir: 0.11 ng/ml (06/2018) with PSA back to baseline              DRE 03/21/2019: No evidence recurrence              PSMA PET 05/22/21: PyL binding in left internal iliac and right retrocrural lymph nodes compatible with metastatic prostate malignancy.              Restarted ADT on 06/17/21 with Degeralix 240 mg inj. Transition to Eligard 22.5 mg with most recent injection 01/05/22   Started Xtandi 07/2021 at 50% dosage 2/2 plavix usage              DEXA scan 09/24/20: Major osteoporotic compression fracture risk: 7.3%, Hip fracture risk: >3%   Completed salvage IMRT to the pelvic LN with Dr. Jethro Bolus on 08/20/21 and SBRT to PSMA positive right retrocrural LN 10/15/2021  Most recent PSA <0.03 ng/ml (01/20/22) with T castrate     - BPH s/p ProTouch laser enucleation of the prostate with tissue morcellation on 01/14/2016. Taking Flomax 0.8 mg daily with no significant change compared to 0.4 mg                 - History of microscopic hematuria, 1+ blood and 5-10 RBC by UA on 11/11/15.              Urine cytology 10/2015 showed rare atypical urothelial cells.               CT Urogram 11/21/15 showed slightly attenuated and perhaps slightly thickened mid-distal right ureter/equivocally strictured, but without proximal dilatation. Rretrograde ureterography in OR 4/26 w/o evidence of filling defect              No gross hematuria     -  Vit D Deficiency with most recent Vit D 14.7 (01/20/22)   DEXA scan (09/24/21): Major osteoporotic compression fracture risk: 7.3%, Hip fracture risk: 2.4%. No Prolia indicated.       - History of TIA on Plavix 75 mg and aspirin 81 mg daily     - History of tobacco use       PLAN:  Continue Ergocalciferol 50,000 units weekly  Continue ADT with Eligard 22.5 mg q 3 months. Plan is for 24 months of ADT.   Restart Xtandi 2 tabs daily given resolution of skin issues with elimination/reduction of hot flashes  Hold megace 20 mg daily unless hot flashes resume  Recommend schedule appt with dermatology for persistent skin tags/moles  Nurse visit  as scheduled 03/2022 for Eligard 22.5 mg/PSA/T  Follow-up in 4 weeks for reassessment     The patient was seen under the supervision of Dr. Claiborne Billings today.      Chief Complaint   Patient presents with    Prostate Cancer       HPI  Colton Cox is a 76 y.o. male who returns for follow-up of his prostate cancer cT1cNxM0 GS 4+3 (Dx 12/12/15), Prostate cancer in 9/12 cores, prostate volume of  49.57 cm3, Pre-biopsy PSA= 3.75ng/mL s/p ADT and XRT completed 04/2016 and BPH s/p ProLEP 12/2015.     At last office visit, patient reported that his hot flashes were getting worse and worse for the past couple of weeks and in that time frame, he noticed a raised, bumpy rash pop up. It started on his back, then spread to his neck/chin area, and then noticed bumps and localized redness on his chest. Waxes and wanes. Was using Allegra to help with the itching and OTC anti-itch creams. He wondered if related to hot flashes as he gets very sweaty and rash is variable and he has been taking xtandi for 6 months. Black cohosh did not help.    Patient reports essentially resolution of hot flashes with megace. This was refilled by office and he is still taking. Skin itching/rash has resolved but he still notes several new moles that were not present previously.     Reports he thought he had a stroke ~8 years ago.  Started plavix at this time.   Denies any episodes of seizures.     Past Medical History:   Diagnosis Date    Cerebral artery occlusion with cerebral infarction [facility])     Colon polyps     Hypertension     Personal history of prostate cancer        Past Surgical History:   Procedure Laterality Date    COLONOSCOPY  01/2018    HERNIA REPAIR      OTHER SURGICAL HISTORY  06/2016    Cat Scan: spot noted on lung    PR APPENDECTOMY      UROLOGICAL SURGERY N/A 01/14/2016    Cysto, Right RPG, and Protouch laser enucleation of the prostate with tissue morcellation - Dr. Claiborne Billings       Current Outpatient Medications   Medication Sig Dispense Refill    megestrol (MEGACE) 20 MG tablet Take 1 tablet by mouth daily 30 tablet 0    vitamin D (ERGOCALCIFEROL) 1.25 MG (50000 UT) CAPS capsule Take 1 capsule by mouth once a week 12 capsule 3    Cholecalciferol 50 MCG (2000 UT) TABS Take by mouth      clopidogrel (PLAVIX) 75 MG tablet Take by mouth      EPINEPHrine (EPIPEN) 0.3 MG/0.3ML SOAJ injection ceived the following from [facility]: Outside name: EPIPEN 2-PAK 0.3 mg/0.3 mL injection      hydroCHLOROthiazide (MICROZIDE) 12.5 MG capsule ceived the following from [facility]: Outside name: hydroCHLOROthiazide (MICROZIDE) 12.5 mg capsule      losartan (COZAAR) 50 MG tablet ceived the following from [facility]: Outside name: losartan (COZAAR) 50 mg tablet      meloxicam (MOBIC) 15 MG tablet Take 1 tablet by mouth daily      simvastatin (ZOCOR) 20 MG tablet ceived the following from [facility]: Outside name: simvastatin (ZOCOR) 20 mg tablet      tamsulosin (FLOMAX) 0.4 MG capsule  Take 2 capsules by mouth      Leuprolide Acetate, 3 Month, (ELIGARD) 22.5 MG KIT Inject 22.5 mg into the skin once for 1 dose 1 kit 0    enzalutamide (XTANDI) 40 MG capsule Take two capsules by mouth daily at bedtime (Patient not taking: Reported on 03/02/2022)      ibuprofen (ADVIL;MOTRIN) 800 MG  tablet ceived the following from [facility]: Outside name: ibuprofen (MOTRIN) 800 mg tablet (Patient not taking: Reported on 03/02/2022)       No current facility-administered medications for this visit.       Allergies   Allergen Reactions    Bee Venom Shortness Of Breath       Family History   Problem Relation Age of Onset    Colon Cancer Father     Colon Cancer Mother        Social History     Socioeconomic History    Marital status: Married     Spouse name: None    Number of children: None    Years of education: None    Highest education level: None   Tobacco Use    Smoking status: Former    Smokeless tobacco: Former     Quit date: 09/20/1988   Substance and Sexual Activity    Alcohol use: Yes    Drug use: No         PHYSICAL EXAM:  There were no vitals filed for this visit.    GEN: NAD, alert and oriented  PULM: nl resp effort, no distress  PSYCH: Nl mood and affect  SKIN: Resolution of diffuse papular scattered erythematous diffuse rash over chest, intertriginous regions, back. Multiple heterogeneous moles of back still present.    LABS:  No results found for this visit on 03/02/22.    PSA Trend     PSA /TESTOSTERONE - [facility] PSA Testosterone, total   Latest Ref Rng & Units 0 - 4 ng/mL 348 - 1197 ng/dL   01/20/2022 <0.03 < 3   10/05/2021 <0.03    06/17/2021 2.29 203   04/13/2021 2.16 187   09/26/2020 1.49     03/13/2020 1.16     12/20/2019 0.99     09/19/2019 1.39 270   06/21/2019 1.06      03/21/2019 0.66 208   12/27/2018 0.42     09/26/2018 0.25 187   07/20/2018 0.11 215   03/14/2018 0.11 184   10/05/2017 0.08 131   04/26/2017 0.04 51   01/25/2017 <0.03     10/26/2016 <0.03 <3 (L)   08/03/2016 <0.03 <3 (L)   11/03/2015 3.75     08/07/2015 5.40 (H)       Lab Results   Component Value Date/Time    VITD25 14.7 01/20/2022 10:45 AM         Surgical Pathology 01/14/2016  FINAL DIAGNOSIS:   PROSTATE, TRANSURETHRAL RESECTION:   BENIGN NODULAR HYPERPLASIA     Prostate Biopsy Pathology:        result Gleason Grade cores   12/12/2015  4+3= 7 POS:9 Total:12      Biopsy Pathology 12/12/2015  DIAGNOSIS:   A. Right Apex Needle biopsy                  Benign prostatic tissue.   B. Right Mid Needle biopsy                  Benign prostatic tissue.   C. Right Base Needle biopsy  ADENOCARCINOMA, GLEASON SCORE 3 + 3 = 6 involving 5 % of the specimen (1 of 1 core(s) positive).                Grade Group 1. Ends not involved by the tumor. Perineural invasion.   D. Right Lateral Apex Needle biopsy                  Benign prostatic tissue.   E. Right Lateral Mid Needle biopsy                  ADENOCARCINOMA, GLEASON SCORE 3 + 3 = 6 involving 10 % of the specimen (1 of 1 core(s) positive).                Grade Group 1. Ends not involved by the tumor.   F. Right Lateral Base Needle biopsy                  ADENOCARCINOMA, GLEASON SCORE 3 + 3 = 6 involving 5 % of the specimen (1 of 1 core(s) positive).                Grade Group 1. Ends not involved by the tumor.   G. Left Apex Needle biopsy                  ADENOCARCINOMA, GLEASON SCORE 4 + 3 = 7 involving 20 % of the specimen, discontinuous (1 of 1 core(s) positive).                Gleason 4 comprises 70 % of the cancer. Grade Group 3. Ends not involved by the tumor.   H. Left Mid Needle biopsy                  ADENOCARCINOMA, GLEASON SCORE 3 + 4 = 7 involving 60 % of the specimen, discontinuous (1 of 1 core(s) positive).                Gleason 4 comprises 20 % of the cancer. Grade Group 2. Tumor involves one end.   I. Left Base Needle biopsy                  ADENOCARCINOMA, GLEASON SCORE 4 + 3 = 7 involving 80 % of the specimen, discontinuous (1 of 1 core(s) positive).                Gleason 4 comprises 70 % of the cancer. Grade Group 3. Tumor involves one end. Perineural invasion.   J. Left Lateral Apex Needle biopsy                  ADENOCARCINOMA, GLEASON SCORE 3 + 4 = 7 involving 20 % of the specimen, discontinuous (1 of 1 core(s) positive).                Gleason 4 comprises 40 % of the  cancer. Grade Group 2. Ends not involved by the tumor.   K. Left Lateral Mid Needle biopsy                  ADENOCARCINOMA, GLEASON SCORE 4 + 3 = 7 involving 60 % of the specimen (3 of 3 core(s) positive).                Gleason 4 comprises 70 % of the cancer. Grade Group 3. Tumor involves one end. Perineural invasion.   L. Left Lateral Base Needle biopsy  ADENOCARCINOMA, GLEASON SCORE 4 + 3 = 7 involving 95 % of the specimen (1 of 1 core(s) positive).                Gleason 4 comprises 70 % of the cancer. Grade Group 3. Ends not involved by the tumor. Perineural invasion.     RADIOLOGY:    PSMA PET Scan 05/22/21  IMPRESSION   1. PyL binding in left internal iliac and right retrocrural lymph nodes compatible with metastatic prostate malignancy.    NM Bone Scan [facility] Body 12/22/2015  IMPRESSION:  No bone scan evidence of osseous metastatic disease.         Teodora Medici PA-C  [facility]     FW:YOVZCHY BAILEY, APRN - NP

## 2022-03-24 NOTE — Telephone Encounter (Signed)
Med check for Oral Oncolytic:       Called pt to perform med check for oral oncolytic:  Xtandi       Are you taking your medication as prescribed?  yes, two tabs daily                Have you been checking your blood pressure or had it checked by another provider recently? no  If so, was it within normal limits? n/a     Have you noticed any new rash on any part of your body? no    Have you experienced any swelling in your extremities? no     Have you experienced any new or worsening pain in the last 30 days?  no    Have you experienced any changes in your ability to perform your usual daily activities?  no                                                                                        Have you experienced any significant changes in your weight or appetite?  no     Have you experienced any significant changes in your ability to sleep?  no     Have you experienced any increase in fatigue?  no    Patient is receiving his medication from [place] without difficulty. Patient has contact information for specialty pharmacy.     Patient does not know how many days of medication he has left left. Pt states he always gets a call about two weeks before he runs out.     Patient's next appointment with provider is scheduled for 04/13/22    Patient is up to date on all labs required per protocol for medication.      Patient advised to call the office if any issues arise.    Page Spiro, MA

## 2022-04-02 ENCOUNTER — Encounter: Admit: 2022-04-02 | Payer: MEDICARE | Primary: Family

## 2022-04-02 DIAGNOSIS — C61 Malignant neoplasm of prostate: Secondary | ICD-10-CM

## 2022-04-02 LAB — PROSTATE SPECIFIC ANTIGEN, TOTAL: PSA: <0.03 ng/mL (ref 0–4)

## 2022-04-02 LAB — TESTOSTERONE: Testosterone: <3 ng/dL — ABNORMAL LOW (ref 348–1197)

## 2022-04-02 MED ORDER — LEUPROLIDE ACETATE (3 MONTH) 22.5 MG SC KIT
22.5 MG | PACK | Freq: Once | SUBCUTANEOUS | 0 refills | Status: AC
Start: 2022-04-02 — End: 2022-04-02

## 2022-04-02 NOTE — Progress Notes (Signed)
Colton Cox is a 76 y.o. male who is here today per the order of PA Whichard to receive Eligard 22.5 mg and to have labs drawn.   Patient's identity has been verified.   PA Saprina Chuong was available in the clinic as incident to provider.     Patient has been taking supplements of at least 500 mg of calcium and 400 international units of Vitamin D daily.  This is not his first ADT injection.  He has  had the necessary labs drawn.  PSA and TST obtained via venipuncture without any difficulty.  Patient will be notified with lab results.    Eligard 22.5 mg is administered to abdomen ( left) SQ without difficulty.     Patient tolerated the injection well.    Patient has been scheduled for the next injection which will be due in approximately 3 months  Patient has a follow-up appointment with provider on April 13, 2022      Encounter Diagnosis   Name Primary?    Malignant neoplasm of prostate ([place]) Yes       Orders Placed This Encounter   Procedures    Prostate Specific Antigen, Total    Testosterone    PR COLLECTION VENOUS BLOOD VENIPUNCTURE    PR CHEMOTHER HORMON ANTINEOPL SUB-Q/IM    PR LEUPROLIDE ACETATE SUSPNSION     Order Specific Question:   Dose     Answer:   22.5 mg     Order Specific Question:   Site     Answer:   LEFT LOWER QUAD ABDOMEN     Order Specific Question:   Expiration Date     Answer:   07/20/2023     Order Specific Question:   Lot#     Answer:   [id number]     Order Specific Question:   Manufacturer     Answer:   tolmar     Order Specific Question:   Charge Quantity?     Answer:   3     Order Specific Question:   Perfomed by/Witnessed by:     Answer:   DP     Order Specific Question:   NDC#     Answer:   [id number] [[id number]]       Patient did not supply own medication.      Kandace Parkins, MA     Bernette Redbird, PA-C,   [facility]

## 2022-04-13 ENCOUNTER — Ambulatory Visit: Admit: 2022-04-13 | Discharge: 2022-04-13 | Payer: MEDICARE | Attending: Physician Assistant | Primary: Family

## 2022-04-13 DIAGNOSIS — C61 Malignant neoplasm of prostate: Secondary | ICD-10-CM

## 2022-04-13 MED ORDER — MIRABEGRON ER 50 MG PO TB24
50 MG | ORAL_TABLET | Freq: Every day | ORAL | 5 refills | Status: AC
Start: 2022-04-13 — End: 2022-10-25

## 2022-04-13 MED ORDER — MEGESTROL ACETATE 20 MG PO TABS
20 MG | ORAL_TABLET | Freq: Every day | ORAL | 5 refills | Status: AC
Start: 2022-04-13 — End: 2022-10-25

## 2022-04-13 NOTE — Progress Notes (Unsigned)
Colton Cox  DOB 06/01/1946  04/13/2022    ASSESSMENT:    ICD-10-CM    1. 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 Vitamin D deficiency, unspecified  E55.9             Upstaged [id number] hormone-sensitive prostate cancer  cT1cNxM0 GS 4+3, Prostate cancer in 9/12 cores, prostate volume of  49.57 cm3, Pre-biopsy PSA= 3.75ng/mL.                Bone scan 12/22/15: negative for metastatic prostate cancer.   CTU 2/17: negative              ADT initiated on 02/06/16 with Eligard 45 mg injection.  Single dose.              Completed XRT 03/18/16-04/23/16 with Dr. Shawnie Dapper               PSA nadir: 0.11 ng/ml (06/2018) with PSA back to baseline              DRE 03/21/2019: No evidence recurrence              PSMA PET 05/22/21: PyL binding in left internal iliac and right retrocrural lymph nodes compatible with metastatic prostate malignancy.              Restarted ADT on 06/17/21 with Degeralix 240 mg inj. Transition to Eligard 22.5 mg with most recent injection 04/02/22   Started Xtandi 07/2021 at 50% dosage 2/2 plavix usage              DEXA scan 09/24/20: Major osteoporotic compression fracture risk: 7.3%, Hip fracture risk: >3%   Completed salvage IMRT to the pelvic LN with Dr. Kris Mouton on 08/20/21 and SBRT to PSMA positive right retrocrural LN 10/15/2021  Most recent PSA <0.03 ng/ml (04/02/2022) with T castrate     - BPH s/p ProTouch laser enucleation of the prostate with tissue morcellation on 01/14/2016. Taking Flomax 0.8 mg daily with no significant change compared to 0.4 mg                 - History of microscopic hematuria, 1+ blood and 5-10 RBC by UA on 11/11/15.              Urine cytology 10/2015 showed rare atypical urothelial cells.               CT Urogram 11/21/15 showed slightly attenuated and perhaps slightly thickened mid-distal right ureter/equivocally strictured, but without proximal dilatation. Rretrograde ureterography in OR 4/26 w/o evidence of filling defect               No gross hematuria     - Vit D Deficiency with most recent Vit D 14.7 (01/20/22)   DEXA scan (09/24/21): Major osteoporotic compression fracture risk: 7.3%, Hip fracture risk: 2.4%. No Prolia indicated.       - History of TIA on Plavix 75 mg and aspirin 81 mg daily     - History of tobacco use       PLAN:  Continue Ergocalciferol 50,000 units weekly  Continue ADT with Eligard 22.5 mg q 3 months. Plan is for 24 months of ADT.   Restart Xtandi 2 tabs daily given resolution of skin issues with elimination/reduction of hot flashes  Hold megace 20 mg daily unless hot flashes resume  Recommend schedule appt with dermatology for persistent skin  tags/moles  Nurse visit as scheduled 03/2022 for Eligard 22.5 mg/PSA/T  Follow-up in 4 weeks for reassessment     The patient was seen under the supervision of Dr. Judie Petit today.      Chief Complaint   Patient presents with    Prostate Cancer       HPI  Colton Cox is a 76 y.o. male who returns for follow-up of his prostate cancer cT1cNxM0 GS 4+3 (Dx 12/12/15), Prostate cancer in 9/12 cores, prostate volume of  49.57 cm3, Pre-biopsy PSA= 3.75ng/mL s/p ADT and XRT completed 04/2016 and BPH s/p ProLEP 12/2015.     At last office visit, patient reported that his hot flashes were getting worse and worse for the past couple of weeks and in that time frame, he noticed a raised, bumpy rash pop up. It started on his back, then spread to his neck/chin area, and then noticed bumps and localized redness on his chest. Waxes and wanes. Was using Allegra to help with the itching and OTC anti-itch creams. He wondered if related to hot flashes as he gets very sweaty and rash is variable and he has been taking xtandi for 6 months. Black cohosh did not help.    Patient reports essentially resolution of hot flashes with megace. This was refilled by office and he is still taking. Skin itching/rash has resolved but he still notes several new moles that were not present previously.         Reports he thought  he had a stroke ~8 years ago. Started plavix at this time.   Denies any episodes of seizures.     Past Medical History:   Diagnosis Date    Cerebral artery occlusion with cerebral infarction [facility])     Colon polyps     Hypertension     Personal history of prostate cancer        Past Surgical History:   Procedure Laterality Date    COLONOSCOPY  01/2018    HERNIA REPAIR      OTHER SURGICAL HISTORY  06/2016    Cat Scan: spot noted on lung    PR APPENDECTOMY      UROLOGICAL SURGERY N/A 01/14/2016    Cysto, Right RPG, and Protouch laser enucleation of the prostate with tissue morcellation - Dr. Tresa Endo       Current Outpatient Medications   Medication Sig Dispense Refill    Ferrous Sulfate Dried (HIGH POTENCY IRON) 65 MG TABS Take by mouth      Ascorbic Acid (VITAMIN C) 1000 MG tablet Take 1 tablet by mouth daily      Cyanocobalamin (VITAMIN B-12) 2500 MCG SUBL Place 1 tablet under the tongue daily      calcium carbonate 600 MG TABS tablet Take 1 tablet by mouth daily      megestrol (MEGACE) 20 MG tablet Take 1 tablet by mouth daily 30 tablet 5    vitamin D (ERGOCALCIFEROL) 1.25 MG (50000 UT) CAPS capsule Take 1 capsule by mouth once a week 12 capsule 3    Cholecalciferol 50 MCG (2000 UT) TABS Take by mouth      clopidogrel (PLAVIX) 75 MG tablet Take by mouth      enzalutamide (XTANDI) 40 MG capsule       EPINEPHrine (EPIPEN) 0.3 MG/0.3ML SOAJ injection ceived the following from [facility]: Outside name: EPIPEN 2-PAK 0.3 mg/0.3 mL injection      hydroCHLOROthiazide (MICROZIDE) 12.5 MG capsule ceived the following from [facility]: Outside name: hydroCHLOROthiazide (MICROZIDE) 12.5 mg capsule      ibuprofen (ADVIL;MOTRIN) 800 MG tablet ceived the following from [facility]: Outside name: ibuprofen (MOTRIN) 800 mg tablet      losartan (COZAAR) 50 MG tablet ceived the following from [facility]: Outside name: losartan (COZAAR) 50 mg tablet      simvastatin  (ZOCOR) 20 MG tablet ceived the following from [facility]: Outside name: simvastatin (ZOCOR) 20 mg tablet      tamsulosin (FLOMAX) 0.4 MG capsule Take 2 capsules by mouth      Leuprolide Acetate, 3 Month, (ELIGARD) 22.5 MG KIT Inject 22.5 mg into the skin once for 1 dose 1 kit 0     No current facility-administered medications for this visit.       Allergies   Allergen Reactions    Bee Venom Shortness Of Breath       Family History   Problem Relation Age of Onset    Colon Cancer Father     Colon Cancer Mother        Social History     Socioeconomic History    Marital status: Married     Spouse name: None    Number of children: None    Years of education: None    Highest education level: None   Tobacco Use    Smoking status: Former    Smokeless tobacco: Former     Quit date: 09/20/1988   Substance and Sexual Activity    Alcohol use: Yes    Drug use: No         PHYSICAL EXAM:  There were no vitals filed for this visit.    GEN: NAD, alert and oriented  PULM: nl resp effort, no distress  PSYCH: Nl mood and affect  SKIN: Resolution of diffuse papular scattered erythematous diffuse rash over chest, intertriginous regions, back. Multiple heterogeneous moles of back still present.    LABS:  No results found for this visit on 04/13/22.    PSA Trend     PSA /TESTOSTERONE - [facility] PSA Testosterone, total   Latest Ref Rng & Units 0 - 4 ng/mL 348 - 1197 ng/dL   8/65/7846 <9.62 < 3   01/20/2022 <0.03 < 3   10/05/2021 <0.03    06/17/2021 2.29 203   04/13/2021 2.16 187   09/26/2020 1.49     03/13/2020 1.16     12/20/2019 0.99     09/19/2019 1.39 270   06/21/2019 1.06      03/21/2019 0.66 208   12/27/2018 0.42     09/26/2018 0.25 187   07/20/2018 0.11 215   03/14/2018 0.11 184   10/05/2017 0.08 131   04/26/2017 0.04 51   01/25/2017 <0.03     10/26/2016 <0.03 <3 (L)   08/03/2016 <0.03 <3 (L)   11/03/2015 3.75     08/07/2015 5.40 (H)       Lab Results   Component Value Date/Time    VITD25 14.7 01/20/2022 10:45 AM         Surgical Pathology  01/14/2016  FINAL DIAGNOSIS:   PROSTATE, TRANSURETHRAL RESECTION:   BENIGN NODULAR HYPERPLASIA     Prostate Biopsy Pathology:        result Gleason Grade cores   12/12/2015 4+3= 7 POS:9 Total:12      Biopsy Pathology 12/12/2015  DIAGNOSIS:   A. Right Apex Needle biopsy  Benign prostatic tissue.   B. Right Mid Needle biopsy                  Benign prostatic tissue.   C. Right Base Needle biopsy                  ADENOCARCINOMA, GLEASON SCORE 3 + 3 = 6 involving 5 % of the specimen (1 of 1 core(s) positive).                Grade Group 1. Ends not involved by the tumor. Perineural invasion.   D. Right Lateral Apex Needle biopsy                  Benign prostatic tissue.   E. Right Lateral Mid Needle biopsy                  ADENOCARCINOMA, GLEASON SCORE 3 + 3 = 6 involving 10 % of the specimen (1 of 1 core(s) positive).                Grade Group 1. Ends not involved by the tumor.   F. Right Lateral Base Needle biopsy                  ADENOCARCINOMA, GLEASON SCORE 3 + 3 = 6 involving 5 % of the specimen (1 of 1 core(s) positive).                Grade Group 1. Ends not involved by the tumor.   G. Left Apex Needle biopsy                  ADENOCARCINOMA, GLEASON SCORE 4 + 3 = 7 involving 20 % of the specimen, discontinuous (1 of 1 core(s) positive).                Gleason 4 comprises 70 % of the cancer. Grade Group 3. Ends not involved by the tumor.   H. Left Mid Needle biopsy                  ADENOCARCINOMA, GLEASON SCORE 3 + 4 = 7 involving 60 % of the specimen, discontinuous (1 of 1 core(s) positive).                Gleason 4 comprises 20 % of the cancer. Grade Group 2. Tumor involves one end.   I. Left Base Needle biopsy                  ADENOCARCINOMA, GLEASON SCORE 4 + 3 = 7 involving 80 % of the specimen, discontinuous (1 of 1 core(s) positive).                Gleason 4 comprises 70 % of the cancer. Grade Group 3. Tumor involves one end. Perineural invasion.   J. Left Lateral Apex Needle biopsy                   ADENOCARCINOMA, GLEASON SCORE 3 + 4 = 7 involving 20 % of the specimen, discontinuous (1 of 1 core(s) positive).                Gleason 4 comprises 40 % of the cancer. Grade Group 2. Ends not involved by the tumor.   K. Left Lateral Mid Needle biopsy                  ADENOCARCINOMA, GLEASON SCORE 4 + 3 = 7  involving 60 % of the specimen (3 of 3 core(s) positive).                Gleason 4 comprises 70 % of the cancer. Grade Group 3. Tumor involves one end. Perineural invasion.   L. Left Lateral Base Needle biopsy                  ADENOCARCINOMA, GLEASON SCORE 4 + 3 = 7 involving 95 % of the specimen (1 of 1 core(s) positive).                Gleason 4 comprises 70 % of the cancer. Grade Group 3. Ends not involved by the tumor. Perineural invasion.     RADIOLOGY:    PSMA PET Scan 05/22/21  IMPRESSION   1. PyL binding in left internal iliac and right retrocrural lymph nodes compatible with metastatic prostate malignancy.    NM Bone Scan [facility] 12/22/2015  IMPRESSION:  No bone scan evidence of osseous metastatic disease.         Vickki Muff PA-C  [facility]     UU:VOZDGUY Washita, APRN - NP

## 2022-04-21 MED ORDER — VITAMIN D (ERGOCALCIFEROL) 1.25 MG (50000 UT) PO CAPS
1.25 MG (50000 UT) | ORAL_CAPSULE | ORAL | 3 refills | Status: AC
Start: 2022-04-21 — End: 2023-03-12

## 2022-04-21 NOTE — Telephone Encounter (Signed)
Pharmacy faxed refill request for vitamin d 1.25 mg. Pt is being seen every 3 months,was last seen on 04/13/22  Per last office note patient is to continue taking the above medication and has a follow-up appointment scheduled for 07/05/22.

## 2022-04-21 NOTE — Telephone Encounter (Signed)
Med check not needed today as pt is on a monthly med check schedule and was just seen last week. PT is doing well on two tabs a day and has a follow up on 07/05/22. Will reach back out when next med check is due.     Page Spiro, MA

## 2022-04-29 MED ORDER — ENZALUTAMIDE 40 MG PO CAPS
40 MG | ORAL_CAPSULE | ORAL | 5 refills | Status: AC
Start: 2022-04-29 — End: 2022-10-21

## 2022-04-29 NOTE — Telephone Encounter (Signed)
Patient is requesting a refill for Xtandi.     Pt is being seen every 3 month(s),was last seen on 04/13/2022.      Per last office note patient is to continue taking the above medication and has a follow-up appointment scheduled for 07/05/2022.     Requested medication has been sent to the pharmacy on file.    Georgiana Spinner, LPN

## 2022-05-26 NOTE — Telephone Encounter (Signed)
Med check for Oral Oncolytic:       Called pt to perform med check for oral oncolytic:  Xtandi      Are you taking your medication as prescribed?  yes. Two tabs daily                Have you been checking your blood pressure or had it checked by another provider recently? yes  If so, was it within normal limits? yes     Have you noticed any new rash on any part of your body? no    Have you experienced any swelling in your extremities? no     Have you experienced any new or worsening pain in the last 30 days?  no    Have you experienced any changes in your ability to perform your usual daily activities?  no                                                                                        Have you experienced any significant changes in your weight or appetite?  no     Have you experienced any significant changes in your ability to sleep?  no     Have you experienced any increase in fatigue?  no    Patient is receiving his medication from [place] without difficulty. Patient has contact information for specialty pharmacy.     Patient does not know how many days of medication he has left.     Patient's next appointment with provider is scheduled for 07/05/22    Patient is up to date on all labs required per protocol for medication.     Patient is receiving ADT in the form of Eligard 22.5 and is up to date on hormone therapy.      Patient advised to call the office if any issues arise.    Page Spiro, MA

## 2022-06-23 NOTE — Telephone Encounter (Signed)
Attempted to reach out to pt to perform a med check on Xtandi. LMOVM reuqesting a call back with my direct ext provided. Pt has been on meds since 03/02/22 and is up to date on all required labs and injections. PT has a follow up next week 07/05/22, I will await pts returned call or reach back out when next med check is due.     Page Spiro, MA

## 2022-06-28 ENCOUNTER — Encounter: Admit: 2022-06-28 | Payer: MEDICARE | Primary: Family

## 2022-06-28 DIAGNOSIS — C61 Malignant neoplasm of prostate: Secondary | ICD-10-CM

## 2022-06-28 MED ORDER — LEUPROLIDE ACETATE (3 MONTH) 22.5 MG SC KIT
22.5 MG | PACK | Freq: Once | SUBCUTANEOUS | 0 refills | Status: DC
Start: 2022-06-28 — End: 2022-12-27

## 2022-06-28 NOTE — Progress Notes (Signed)
Colton Cox is a 76 y.o. male who is here today per the order of PA Noell to receive Eligard22.5  and to have labs drawn.   Patient's identity has been verified.   PA Keturah Barre was available in the clinic as incident to provider.     Patient has been taking supplements of at least 500 mg of calcium and 400 international units of Vitamin D daily.  This is not his first ADT injection.  He has  had the necessary labs drawn.  PSA, TST, CMP, Vitamin D, and CBC  obtained via venipuncture without any difficulty.  Patient will be notified with lab results.    Eligard is administered to abdomen ( right) SQ without difficulty.     Patient tolerated the injection well.    Patient has been scheduled for the next injection which will be due in approximately 3 months  Patient has a follow-up appointment with provider on July 05, 2022      Encounter Diagnoses   Name Primary?    Malignant neoplasm of prostate ([place]) Yes    Vitamin D deficiency, unspecified        Orders Placed This Encounter   Procedures    PSA, Diagnostic    Testosterone    Vitamin D 25 Hydroxy    Comprehensive Metabolic Panel    CBC    PR COLLECTION VENOUS BLOOD VENIPUNCTURE    PR CHEMOTHER HORMON ANTINEOPL SUB-Q/IM    PR LEUPROLIDE ACETATE SUSPNSION     Order Specific Question:   Dose     Answer:   22.5 mg     Order Specific Question:   Site     Answer:   RIGHT LOWER QUAD ABDOMEN     Order Specific Question:   Expiration Date     Answer:   12/18/2023     Order Specific Question:   Lot#     Answer:   [id number]     Order Specific Question:   Manufacturer     Answer:   tolmar     Order Specific Question:   Charge Quantity?     Answer:   3     Order Specific Question:   Perfomed by/Witnessed by:     Answer:   TB     Order Specific Question:   NDC#     Answer:   [id number] [[id number]]       Patient did not supply own medication.      [place], [place]     Reviewed and agreed.   Jolayne Haines, PA-C

## 2022-06-29 LAB — CBC
Hematocrit: 32.7 % — ABNORMAL LOW (ref 37.5–51.0)
Hemoglobin: 11.4 g/dL — ABNORMAL LOW (ref 13.0–17.7)
MCH: 33.9 pg — ABNORMAL HIGH (ref 26.6–33.0)
MCHC: 34.9 g/dL (ref 31.5–35.7)
MCV: 97 fL (ref 79–97)
Platelets: 248 10*3/uL (ref 150–450)
RBC: 3.36 x10E6/uL — ABNORMAL LOW (ref 4.14–5.80)
RDW: 11.8 % (ref 11.6–15.4)
WBC: 5.7 10*3/uL (ref 3.4–10.8)

## 2022-06-29 LAB — COMPREHENSIVE METABOLIC PANEL
ALT: 11 IU/L (ref 0–44)
AST: 14 IU/L (ref 0–40)
Albumin/Globulin Ratio: 1.6 (ref 1.2–2.2)
Albumin: 3.9 g/dL (ref 3.8–4.8)
Alkaline Phosphatase: 40 IU/L — ABNORMAL LOW (ref 44–121)
BUN/Creatinine Ratio: 19 (ref 10–24)
BUN: 18 mg/dL (ref 8–27)
CO2: 24 mmol/L (ref 20–29)
Calcium: 9 mg/dL (ref 8.6–10.2)
Chloride: 104 mmol/L (ref 96–106)
Creatinine: 0.94 mg/dL (ref 0.76–1.27)
Est, Glomerular Filtration Rate: 83 mL/min/{1.73_m2} (ref >59)
Globulin, Total: 2.4 g/dL (ref 1.5–4.5)
Glucose: 114 mg/dL — ABNORMAL HIGH (ref 70–99)
Potassium: 4.7 mmol/L (ref 3.5–5.2)
Sodium: 140 mmol/L (ref 134–144)
Total Bilirubin: 0.2 mg/dL (ref 0.0–1.2)
Total Protein: 6.3 g/dL (ref 6.0–8.5)

## 2022-06-29 LAB — TESTOSTERONE: Testosterone: <3 ng/dL — ABNORMAL LOW (ref 264–916)

## 2022-06-29 LAB — VITAMIN D 25 HYDROXY: Vit D, 25-Hydroxy: 42.8 ng/mL (ref 30.0–100.0)

## 2022-06-29 LAB — PSA, DIAGNOSTIC: PSA: <0.1 ng/mL (ref 0.0–4.0)

## 2022-07-05 ENCOUNTER — Ambulatory Visit: Admit: 2022-07-05 | Discharge: 2022-07-05 | Payer: MEDICARE | Attending: Physician Assistant | Primary: Family

## 2022-07-05 DIAGNOSIS — C772 Secondary and unspecified malignant neoplasm of intra-abdominal lymph nodes: Secondary | ICD-10-CM

## 2022-07-05 MED ORDER — TAMSULOSIN HCL 0.4 MG PO CAPS
0.4 MG | ORAL_CAPSULE | Freq: Every day | ORAL | 3 refills | Status: DC
Start: 2022-07-05 — End: 2022-12-20

## 2022-07-05 NOTE — Progress Notes (Signed)
Colton Cox  DOB 1945/11/01  07/05/2022    ASSESSMENT:    ICD-10-CM    1. 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 Vitamin D deficiency, unspecified  E55.9       3. Urgency of urination  R39.15               Upstaged [id number] hormone-sensitive prostate cancer  cT1cNxM0 GS 4+3, Prostate cancer in 9/12 cores, prostate volume of  49.57 cm3, Pre-biopsy PSA= 3.75ng/mL.                Bone scan 12/22/15: negative for metastatic prostate cancer.   CTU 2/17: negative              ADT initiated on 02/06/16 with Eligard 45 mg injection.  Single dose.              Completed XRT 03/18/16-04/23/16 with Dr. Tobe Sos               PSA nadir: 0.11 ng/ml (06/2018) with PSA back to baseline              DRE 03/21/2019: No evidence recurrence              PSMA PET 05/22/21: PyL binding in left internal iliac and right retrocrural lymph nodes compatible with metastatic prostate malignancy.              Restarted ADT on 06/17/21 with Degeralix 240 mg inj. Transition to Eligard 22.5 mg with most recent injection 04/02/22   Started Xtandi 07/2021 at 50% dosage 2/2 plavix usage              DEXA scan 09/24/20: Major osteoporotic compression fracture risk: 7.3%, Hip fracture risk: >3%   Completed salvage IMRT to the pelvic LN with Dr. Jethro Bolus on 08/20/21 and SBRT to PSMA positive right retrocrural LN 10/15/2021  Most recent PSA <0.1 ng/ml (06/28/2022) with T castrate     - BPH s/p ProTouch laser enucleation of the prostate with tissue morcellation on 01/14/2016. Taking Flomax 0.8 mg daily with no significant change compared to 0.4 mg                 - History of microscopic hematuria, 1+ blood and 5-10 RBC by UA on 11/11/15.              Urine cytology 10/2015 showed rare atypical urothelial cells.               CT Urogram 11/21/15 showed slightly attenuated and perhaps slightly thickened mid-distal right ureter/equivocally strictured, but without proximal dilatation. Rretrograde ureterography in OR 4/26  w/o evidence of filling defect              No gross hematuria     - Vit D Deficiency with Vit D 14.7 (01/20/22), most recently improved s/p repletion with Vit D WNL 06/28/22   DEXA scan (09/24/21): Major osteoporotic compression fracture risk: 7.3%, Hip fracture risk: 2.4%. No Prolia indicated.       - History of TIA on Plavix 75 mg and aspirin 81 mg daily     - History of tobacco use       PLAN:  Continue Ergocalciferol 50,000 units weekly  Continue ADT with Eligard 22.5 mg q 3 months. Plan is for 24 months of ADT. Will discuss whether last injection will be 12/2022 or 03/2023 at next office visit  per patient preference  Continue Xtandi 2 tabs daily given resolution of skin issues with elimination/reduction of hot flashes  Continue megace 20 mg  Continue myrbetriq 50 mg  Decrease flomax to 0.4 mg to see if this improves frequency symptoms as increased dosage did not improve symptoms in the past. Contact office should LUTS worsen on original dosage  Nurse visit as scheduled 09/2022 for Eligard 22.5 mg/PSA/T  Follow-up 6 months with PSA/T/Vit D/Eligard 22.5 mg prior    The patient was seen under the supervision of Dr. Fatima Sanger today.      Chief Complaint   Patient presents with    Prostate Cancer       HPI  Colton Cox is a 76 y.o. male who returns for follow-up of his prostate cancer cT1cNxM0 GS 4+3 (Dx 12/12/15), Prostate cancer in 9/12 cores, prostate volume of  49.57 cm3, Pre-biopsy PSA= 3.75ng/mL s/p ADT and XRT completed 04/2016 and BPH s/p ProLEP 12/2015.     At last office visit, patient reported that his hot flashes were getting worse and worse for the past couple of weeks and in that time frame, he noticed a raised, bumpy rash pop up. It started on his back, then spread to his neck/chin area, and then noticed bumps and localized redness on his chest. Waxed and waned. Was using Allegra to help with the itching and OTC anti-itch creams. He wondered if related to hot flashes as he gets very sweaty and rash is variable  and he has been taking xtandi for 6 months. Black cohosh did not help. Patient with essentially resolved hot flashes with megace. He stopped taking with return of hot flashes. Skin itching/rash has resolved and remains resolved.    At last office visit, patient noted bothersome urinary frequency and urgency today. Still taking flomax 0.8 mg. No dysuria or hematuria. Tried myrbetriq with initial benefit but now notes varying benefit. Nocturia is most variable.    Following with oncology re: blood cell counts and started on iron/b12 supplements.     Reports he thought he had a stroke ~8 years ago. Started plavix at this time.   Denies any episodes of seizures.     Past Medical History:   Diagnosis Date    Cerebral artery occlusion with cerebral infarction [facility])     Colon polyps     Hypertension     Personal history of prostate cancer        Past Surgical History:   Procedure Laterality Date    COLONOSCOPY  01/2018    HERNIA REPAIR      OTHER SURGICAL HISTORY  06/2016    Cat Scan: spot noted on lung    PR APPENDECTOMY      UROLOGICAL SURGERY N/A 01/14/2016    Cysto, Right RPG, and Protouch laser enucleation of the prostate with tissue morcellation - Dr. Claiborne Billings       Current Outpatient Medications   Medication Sig Dispense Refill    tamsulosin (FLOMAX) 0.4 MG capsule Take 1 capsule by mouth daily 90 capsule 3    enzalutamide (XTANDI) 40 MG capsule Take two capsules by mouth at bedtime each day. 60 capsule 5    vitamin D (ERGOCALCIFEROL) 1.25 MG (50000 UT) CAPS capsule Take 1 capsule by mouth once a week 12 capsule 3    Ferrous Sulfate Dried (HIGH POTENCY IRON) 65 MG TABS Take by mouth      Ascorbic Acid (VITAMIN C) 1000 MG tablet Take 1 tablet by mouth daily  Cyanocobalamin (VITAMIN B-12) 2500 MCG SUBL Place 1 tablet under the tongue daily      calcium carbonate 600 MG TABS tablet Take 1 tablet by mouth daily      megestrol (MEGACE) 20 MG tablet Take 1 tablet by mouth daily 30 tablet 5    mirabegron (MYRBETRIQ) 50 MG  TB24 Take 50 mg by mouth daily 30 tablet 5    Cholecalciferol 50 MCG (2000 UT) TABS Take by mouth      clopidogrel (PLAVIX) 75 MG tablet Take by mouth      EPINEPHrine (EPIPEN) 0.3 MG/0.3ML SOAJ injection ceived the following from [facility]: Outside name: EPIPEN 2-PAK 0.3 mg/0.3 mL injection      hydroCHLOROthiazide (MICROZIDE) 12.5 MG capsule ceived the following from [facility]: Outside name: hydroCHLOROthiazide (MICROZIDE) 12.5 mg capsule      ibuprofen (ADVIL;MOTRIN) 800 MG tablet ceived the following from [facility]: Outside name: ibuprofen (MOTRIN) 800 mg tablet      losartan (COZAAR) 50 MG tablet ceived the following from [facility]: Outside name: losartan (COZAAR) 50 mg tablet      simvastatin (ZOCOR) 20 MG tablet ceived the following from [facility]: Outside name: simvastatin (ZOCOR) 20 mg tablet      Leuprolide Acetate, 3 Month, (ELIGARD) 22.5 MG KIT Inject 22.5 mg into the skin once for 1 dose 1 kit 0     No current facility-administered medications for this visit.       Allergies   Allergen Reactions    Bee Venom Shortness Of Breath       Family History   Problem Relation Age of Onset    Colon Cancer Father     Colon Cancer Mother        Social History     Socioeconomic History    Marital status: Married     Spouse name: None    Number of children: None    Years of education: None    Highest education level: None   Tobacco Use    Smoking status: Former    Smokeless tobacco: Former     Quit date: 09/20/1988   Substance and Sexual Activity    Alcohol use: Yes    Drug use: No         PHYSICAL EXAM:  There were no vitals filed for this visit.    GEN: NAD, alert and oriented  PULM: nl resp effort, no distress  PSYCH: Nl mood and affect    LABS:  No results found for this visit on 07/05/22.    PSA Trend     PSA /TESTOSTERONE - [facility] PSA Testosterone, total   Latest Ref Rng & Units 0 - 4 ng/mL 348 - 1197 ng/dL   06/28/2022 <0.1     04/02/2022 <0.03 < 3   01/20/2022 <0.03 < 3   10/05/2021 <0.03    06/17/2021 2.29 203   04/13/2021 2.16 187   09/26/2020 1.49     03/13/2020 1.16     12/20/2019 0.99     09/19/2019 1.39 270   06/21/2019 1.06      03/21/2019 0.66 208   12/27/2018 0.42     09/26/2018 0.25 187   07/20/2018 0.11 215   03/14/2018 0.11 184   10/05/2017 0.08 131   04/26/2017 0.04 51   01/25/2017 <0.03     10/26/2016 <0.03 <3 (L)   08/03/2016 <0.03 <3 (L)  11/03/2015 3.75     08/07/2015 5.40 (H)       Lab Results   Component Value Date/Time    VITD25 42.8 06/28/2022 11:08 AM     Lab Results   Component Value Date    WBC 5.7 06/28/2022    HGB 11.4 (L) 06/28/2022    HCT 32.7 (L) 06/28/2022    MCV 97 06/28/2022    PLT 248 06/28/2022     Lab Results   Component Value Date    NA 140 06/28/2022    K 4.7 06/28/2022    CL 104 06/28/2022    CO2 24 06/28/2022    BUN 18 06/28/2022    CREATININE 0.94 06/28/2022    GLUCOSE 114 (H) 06/28/2022    CALCIUM 9.0 06/28/2022    PROT 6.3 06/28/2022    LABALBU 3.9 06/28/2022    BILITOT 0.2 06/28/2022    ALKPHOS 40 (L) 06/28/2022    AST 14 06/28/2022    ALT 11 06/28/2022    LABGLOM 83 06/28/2022    AGRATIO 1.6 06/28/2022             Surgical Pathology 01/14/2016  FINAL DIAGNOSIS:   PROSTATE, TRANSURETHRAL RESECTION:   BENIGN NODULAR HYPERPLASIA     Prostate Biopsy Pathology:        result Gleason Grade cores   12/12/2015 4+3= 7 POS:9 Total:12      Biopsy Pathology 12/12/2015  DIAGNOSIS:   A. Right Apex Needle biopsy                  Benign prostatic tissue.   B. Right Mid Needle biopsy                  Benign prostatic tissue.   C. Right Base Needle biopsy                  ADENOCARCINOMA, GLEASON SCORE 3 + 3 = 6 involving 5 % of the specimen (1 of 1 core(s) positive).                Grade Group 1. Ends not involved by the tumor. Perineural invasion.   D. Right Lateral Apex Needle biopsy                  Benign prostatic tissue.   E. Right Lateral Mid Needle biopsy                  ADENOCARCINOMA, GLEASON SCORE 3 + 3 = 6 involving 10 % of  the specimen (1 of 1 core(s) positive).                Grade Group 1. Ends not involved by the tumor.   F. Right Lateral Base Needle biopsy                  ADENOCARCINOMA, GLEASON SCORE 3 + 3 = 6 involving 5 % of the specimen (1 of 1 core(s) positive).                Grade Group 1. Ends not involved by the tumor.   G. Left Apex Needle biopsy                  ADENOCARCINOMA, GLEASON SCORE 4 + 3 = 7 involving 20 % of the specimen, discontinuous (1 of 1 core(s) positive).                Gleason 4 comprises 70 % of the cancer.  Grade Group 3. Ends not involved by the tumor.   H. Left Mid Needle biopsy                  ADENOCARCINOMA, GLEASON SCORE 3 + 4 = 7 involving 60 % of the specimen, discontinuous (1 of 1 core(s) positive).                Gleason 4 comprises 20 % of the cancer. Grade Group 2. Tumor involves one end.   I. Left Base Needle biopsy                  ADENOCARCINOMA, GLEASON SCORE 4 + 3 = 7 involving 80 % of the specimen, discontinuous (1 of 1 core(s) positive).                Gleason 4 comprises 70 % of the cancer. Grade Group 3. Tumor involves one end. Perineural invasion.   J. Left Lateral Apex Needle biopsy                  ADENOCARCINOMA, GLEASON SCORE 3 + 4 = 7 involving 20 % of the specimen, discontinuous (1 of 1 core(s) positive).                Gleason 4 comprises 40 % of the cancer. Grade Group 2. Ends not involved by the tumor.   K. Left Lateral Mid Needle biopsy                  ADENOCARCINOMA, GLEASON SCORE 4 + 3 = 7 involving 60 % of the specimen (3 of 3 core(s) positive).                Gleason 4 comprises 70 % of the cancer. Grade Group 3. Tumor involves one end. Perineural invasion.   L. Left Lateral Base Needle biopsy                  ADENOCARCINOMA, GLEASON SCORE 4 + 3 = 7 involving 95 % of the specimen (1 of 1 core(s) positive).                Gleason 4 comprises 70 % of the cancer. Grade Group 3. Ends not involved by the tumor. Perineural invasion.     RADIOLOGY:    PSMA PET Scan  05/22/21  IMPRESSION   1. PyL binding in left internal iliac and right retrocrural lymph nodes compatible with metastatic prostate malignancy.    NM Bone Scan [facility] Body 12/22/2015  IMPRESSION:  No bone scan evidence of osseous metastatic disease.         Teodora Medici PA-C  [facility]     CC:Henry Russel, APRN - NP

## 2022-07-23 NOTE — Telephone Encounter (Signed)
Attempted to reach out to pt to perform a med check on Xtandi. Reached wife who stated pt was not home at this time. Wife took down my direct ext for pt to give me a call back when he gets home. Will follow up on this as I was unable to speak to pt last month.    Page Spiro, MA

## 2022-07-28 NOTE — Telephone Encounter (Signed)
PT was at work and unable to perform a full med check. Pt was able to confirm that he is doing well on two tabs a day with no side effects at all and is expecting a refill this Friday. I will reach back out when next med check is due. Pt is up to date on labs, follow up, and injeciton.    Page Spiro, MA

## 2022-08-24 NOTE — Telephone Encounter (Signed)
Med check for Oral Oncolytic:       Called pt to perform med check for oral oncolytic:  Xtandi      Are you taking your medication as prescribed?  Yes, two tabs daily                Have you been checking your blood pressure or had it checked by another provider recently? yes  If so, was it within normal limits? 135/75     Have you noticed any new rash on any part of your body? no    Have you experienced any swelling in your extremities? no     Have you experienced any new or worsening pain in the last 30 days?  no    Have you experienced any changes in your ability to perform your usual daily activities?  no                                                                                        Have you experienced any significant changes in your weight or appetite?  no     Have you experienced any significant changes in your ability to sleep?  no     Have you experienced any increase in fatigue?  pt has fatigue     Patient is receiving his medication from [facility] without difficulty. Patient has contact information for specialty pharmacy.     Patient does not know how many days of medication he has left.     Patient's next appointment with provider is scheduled for 01/02/22    Patient is up to date on all labs required per protocol for medication.     Patient is receiving ADT in the form of Eligard22.5 and is up to date on hormone therapy.      Patient advised to call the office if any issues arise.    Page Spiro, MA

## 2022-08-29 NOTE — ED Notes (Signed)
Formatting of this note is different from the original.     08/29/22 1239   Lab Draw   Lab Draw Time 1235   Lab Sent Time 1308   Lab Draw Site Left;Antecubital   Lab Draw Device Angiocatheter   Device Gauge 20   # of Lab Draw Attempts 1 Attempt   Labs Drawn with IV Start Yes   Lab Tubes (S)  ISTAT;Rainbow;Green       Electronically signed by Karie Soda at 08/29/2022 12:39 PM EST

## 2022-08-29 NOTE — Progress Notes (Signed)
Formatting of this note is different from the original.  Pharmacy Best Possible Medication History (BP[facility]) Note    Rx Adm Med Hx: Complete  List updated prior to admission med rec?: Yes    Medication information was obtained from:  patient, home medication list, and electronic claims database    Medication history interview was conducted: in person  PPE worn: mask-surgical  Patient masked: No    Targeted PTA Meds: Chemotherapy (oral)    Review Prior to Admission (PTA) medication list for more details.    Allergies   Allergen Reactions    Bee Stings wheezing/sob     Prior to Admission Medications   Prescriptions Last Dose Patient Reported? Taking?   Ascorbic Acid (VITAMIN C) 1,000 mg PO TABS 08/29/2022 at am Yes Yes   Sig: Take 1 Tab by Mouth Every Morning.   Meloxicam 15 mg PO TABS PRN Yes Yes   Sig: Take 1 Tab by Mouth Daily as needed.   calcium carbonate (CALCIUM 600 PO) 08/29/2022 at am Yes Yes   Sig: Take 1 Tab by Mouth 2 (two) times a day.   clopidogreL (PLAVIX) 75 mg PO TABS 08/29/2022 at am Yes Yes   Sig: Take 1 Tab by Mouth Every Morning.   cyanocobalamin (VITAMIN B-12) 250 mcg PO TABS 08/29/2022 at am Yes Yes   Sig: Take 1 Tab by Mouth Once a Day.   enzalutamide (XTANDI) 40 mg PO 08/28/2022 at pm Yes Yes   Sig: Take 2 Caps by Mouth Every Night at Bedtime.   Note (08/29/2022): Patient is aware this is not on formulary   ergocalciferol (VITAMIN D2) 50,000 unit PO CAPS 08/23/2022 Yes Yes   Sig: Take 1 Cap by Mouth Every 7 Days. Mondays   ferrous sulfate (FEOSOL) 325 mg (65 mg Iron) PO TABS 08/28/2022 at pm Yes Yes   Sig: Take 1 Tab by Mouth Once a Day.   folic acid (FOLVITE) 1 mg PO TABS 08/29/2022 at am Yes Yes   Sig: Take 1 Tab by Mouth Once a Day.   megestroL (MEGACE) 20 mg PO TABS 08/28/2022 at pm Yes Yes   Sig: Take 1 Tab by Mouth every evening.   mirabegron (MYRBETRIQ) 50 mg PO TB24 08/28/2022 at pm Yes Yes   Sig: Take 1 Tab by Mouth every evening.   simvastatin (ZOCOR) 20 mg PO TABS 08/28/2022 at pm Yes Yes    Sig: Take 1 Tab by Mouth Every Night at Bedtime.   tamsulosin (FLOMAX) 0.4 mg PO CP24 08/28/2022 at pm Yes Yes   Sig: Take 1 Cap by Mouth Daily Before Supper.   Note (08/29/2022): Was told he could increase to 2 capsules but hasn't yet     Facility-Administered Medications: None     Medications Discontinued During This Encounter   Medication Reason    azithromycin (Zithromax) 500 mg in NS 250 mL IVPB     HYDROcodone-chlorpheniramine (TUSSIONEX) 10-8 mg/5 mL PO Su12 Therapy completed    losartan-hydrochlorothiazide (HYZAAR) 50-12.5 mg PO TABS Removed from list: Not a Home Med    naproxen (NAPROSYN) 500 mg PO TABS Removed from list: Not a Home Med    SIMVASTATIN PO Removed from list: Not a Home Med       Electronically signed by Joetta Manners at 08/29/2022  5:32 PM EST

## 2022-08-29 NOTE — ED Triage Notes (Signed)
Formatting of this note might be different from the original.  Pt arrives with c/o increased SOB and cough and L rib pain for the last 2 weeks. Pt had COVID 2 weeks ago.   Electronically signed by Boneta Lucks, RN at 08/29/2022 12:26 PM EST
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
## 2022-09-01 NOTE — Telephone Encounter (Signed)
PT called in and LVM stating that he received a call that his appts were canceled. Called and confirmed pt appts, scheduled an appt for full ADT labs prior before his follow up. We thanked each other and the call ended.    Page Spiro, MA

## 2022-09-22 ENCOUNTER — Encounter

## 2022-09-22 NOTE — Telephone Encounter (Signed)
Med check for Oral Oncolytic:       Called pt to perform med check for oral oncolytic:  Xtandi      Are you taking your medication as prescribed?  Yes, two tabs daily                Have you been checking your blood pressure or had it checked by another provider recently? no  If so, was it within normal limits? 133/78     Have you noticed any new rash on any part of your body? no    Have you experienced any swelling in your extremities? no     Have you experienced any new or worsening pain in the last 30 days?  no    Have you experienced any changes in your ability to perform your usual daily activities?  no                                                                                        Have you experienced any significant changes in your weight or appetite?  no     Have you experienced any significant changes in your ability to sleep?  no     Have you experienced any increase in fatigue?  PT does have fatigue.    Patient is receiving his medication from [facility] without difficulty. Patient has contact information for specialty pharmacy.     Patient does not know how many days of medication he has left. Pt states his pharmacy usually calls a week prior.     Patient's next appointment with provider is scheduled for 01/03/23    Patient is up to date on all labs required per protocol for medication.     Patient is receiving ADT in the form of Eligard22.5 and is up to date on hormone therapy.      Patient advised to call the office if any issues arise.    Page Spiro, MA

## 2022-10-01 ENCOUNTER — Encounter: Admit: 2022-10-01 | Discharge: 2022-10-01 | Payer: MEDICARE

## 2022-10-01 DIAGNOSIS — C772 Secondary and unspecified malignant neoplasm of intra-abdominal lymph nodes: Secondary | ICD-10-CM

## 2022-10-01 MED ORDER — LEUPROLIDE ACETATE (3 MONTH) 22.5 MG SC KIT
22.5 MG | PACK | Freq: Once | SUBCUTANEOUS | 0 refills | Status: DC
Start: 2022-10-01 — End: 2022-12-27

## 2022-10-01 NOTE — Progress Notes (Signed)
Colton Cox is a 77 y.o. male who is here today per the order of Deashia Soule to receive Eligard and to have labs drawn.   Patient's identity has been verified. [name] was available in the clinic as incident to provider.     Patient has been taking supplements of at least 500 mg of calcium and 400 international units of Vitamin D daily.  This is not his first ADT injection.  He has  had the necessary labs drawn.  PSA and TST obtained via venipuncture without any difficulty.  Patient will be notified with lab results.    Eligard is administered to abdomen ( left) SQ without difficulty.     Patient tolerated the injection well.    Patient has been scheduled for the next injection which will be due in approximately 3 months  Patient has a follow-up appointment with provider on January 03, 2023      Encounter Diagnoses   Name Primary?    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    Vitamin D deficiency, unspecified        Orders Placed This Encounter   Procedures    Prostate Specific Antigen, Total    Testosterone    PR COLLECTION VENOUS BLOOD VENIPUNCTURE    PR CHEMOTHER HORMON ANTINEOPL SUB-Q/IM    PR LEUPROLIDE ACETATE SUSPNSION     Order Specific Question:   Dose     Answer:   22.'5mg'$      Order Specific Question:   Site     Answer:   LEFT LOWER QUAD ABDOMEN     Order Specific Question:   Expiration Date     Answer:   01/19/2024     Order Specific Question:   Lot#     Answer:   [id number]     Order Specific Question:   Manufacturer     Answer:   tolmar     Order Specific Question:   Charge Quantity?     Answer:   3     Order Specific Question:   Perfomed by/Witnessed by:     Answer:   Malik Paar     Order Specific Question:   NDC#     Answer:   [id number] [[id number]]       Patient did not supply own medication.      Pricilla Holm, MA       Reviewed,  Teodora Medici PA-C  [facility]

## 2022-10-04 LAB — TESTOSTERONE: Testosterone: <3 ng/dL — ABNORMAL LOW (ref 348–1197)

## 2022-10-04 LAB — PROSTATE SPECIFIC ANTIGEN, TOTAL: PSA: <0.03 ng/mL (ref 0–4)

## 2022-10-21 MED ORDER — ENZALUTAMIDE 40 MG PO CAPS
40 MG | ORAL_CAPSULE | ORAL | 5 refills | Status: DC
Start: 2022-10-21 — End: 2023-04-08

## 2022-10-21 NOTE — Telephone Encounter (Signed)
Patient is requesting a refill for Xtandi.     Pt is being seen every 6 month(s),was last seen on 07/05/2022.      Per last office note patient is to continue taking the above medication and has a follow-up appointment scheduled for 01/03/2023.    Patient is up to date on ADT utilizing Eligard 22.'5mg'$ .      Requested medication has been sent to the pharmacy on file.    Georgiana Spinner, LPN

## 2022-10-25 MED ORDER — MEGESTROL ACETATE 20 MG PO TABS
20 MG | ORAL_TABLET | Freq: Every day | ORAL | 3 refills | Status: AC
Start: 2022-10-25 — End: 2023-11-06

## 2022-10-25 MED ORDER — MIRABEGRON ER 50 MG PO TB24
50 MG | ORAL_TABLET | Freq: Every day | ORAL | 3 refills | Status: AC
Start: 2022-10-25 — End: 2023-11-06

## 2022-10-25 NOTE — Telephone Encounter (Signed)
Med check for Oral Oncolytic:       Called pt to perform med check for oral oncolytic:  Xtandi      Are you taking your medication as prescribed?  Yes, two tabs daily                Have you been checking your blood pressure or had it checked by another provider recently? Yes  If so, was it within normal limits? 115/80     Have you noticed any new rash on any part of your body? no    Have you experienced any swelling in your extremities? no     Have you experienced any new or worsening pain in the last 30 days?  no    Have you experienced any changes in your ability to perform your usual daily activities?  no                                                                                        Have you experienced any significant changes in your weight or appetite?  no     Have you experienced any significant changes in your ability to sleep?  no     Have you experienced any increase in fatigue?  no    Patient is receiving his medication from [facility] without difficulty. Patient has contact information for specialty pharmacy.     Patient has seven days of medication left. Pt has already called to obtain a refill.      Patient's next appointment with provider is scheduled for 01/03/23    Patient is up to date on all labs required per protocol for medication.     Patient is receiving ADT in the form of Eligard22.5 and is up to date on hormone therapy.      Patient advised to call the office if any issues arise.    Page Spiro, MA

## 2022-10-25 NOTE — Telephone Encounter (Signed)
Pt called requested refill megace and mybetriq as per lov 07/05/22 pt is to continue medicaiton and follow up on 01/03/23. [facility]ill sent to [facility] as per request.

## 2022-11-22 NOTE — Telephone Encounter (Signed)
Med check for Oral Oncolytic:     Called pt to perform med check for oral oncolytic:  Xtandi      Pt did not answer phone call for med check. LVM advising pt to call back if he has any questions or concerns.      Patient advised to call the office if any issues arise.    Georgiana Spinner, LPN

## 2022-12-20 MED ORDER — TAMSULOSIN HCL 0.4 MG PO CAPS
0.4 MG | ORAL_CAPSULE | ORAL | 0 refills | Status: DC
Start: 2022-12-20 — End: 2023-07-05

## 2022-12-20 NOTE — Telephone Encounter (Signed)
Med check for Oral Oncolytic:     Called pt to perform med check for oral oncolytic: Xtandi      Are you taking your medication as prescribed? Yes                Have you had any change in blood pressure, swelling in extremities, or noticed a rash on any part of your body? No    Have you had any changes in ability to perform usual daily activities, changes in weight or appetite, changes in ability to sleep, increase in fatigue or pain in the last 30 days? No    Patient is receiving his medication from [facility] without difficulty. Patient has contact information for specialty pharmacy.      Patient's next appointment with provider is scheduled for 01/03/23.     Patient advised to call the office if any issues arise.    Georgiana Spinner, LPN

## 2022-12-27 ENCOUNTER — Encounter: Admit: 2022-12-27 | Discharge: 2022-12-27 | Payer: MEDICARE

## 2022-12-27 DIAGNOSIS — E559 Vitamin D deficiency, unspecified: Secondary | ICD-10-CM

## 2022-12-27 MED ORDER — LEUPROLIDE ACETATE (3 MONTH) 22.5 MG SC KIT
22.5 MG | PACK | Freq: Once | SUBCUTANEOUS | 0 refills | Status: AC
Start: 2022-12-27 — End: 2022-12-27

## 2022-12-27 NOTE — Progress Notes (Signed)
Colton Cox is a 77 y.o. male who is here today per the order of Dr. Tresa Endo to receive Eligard and to have labs drawn.   Patient's identity has been verified.   Dr. Doreene Burke was available in the clinic as incident to provider.     Patient has been taking supplements of at least 500 mg of calcium and 400 international units of Vitamin D daily.  This is not his first ADT injection.  He has  had the necessary labs drawn.  PSA, TST, CMP, Vitamin D, and CBC  obtained via venipuncture without any difficulty.  Patient will be notified with lab results.    Eligard is administered to abdomen ( right) SQ without difficulty.     Patient tolerated the injection well.    Patient has been scheduled for the next injection which will be due in approximately 3 months  Patient has a follow-up appointment with provider on January 03, 2023      Encounter Diagnoses   Name Primary?    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)        Orders Placed This Encounter   Procedures    Prostate Specific Antigen, Total    Testosterone    Vitamin D, 25 Hydroxy    CBC    Comprehensive Metabolic Panel    PR COLLECTION VENOUS BLOOD VENIPUNCTURE    PR CHEMOTHER HORMON ANTINEOPL SUB-Q/IM    PR LEUPROLIDE ACETATE SUSPNSION     Order Specific Question:   Dose     Answer:   22.5mg      Order Specific Question:   Site     Answer:   RIGHT LOWER QUAD ABDOMEN     Order Specific Question:   Expiration Date     Answer:   03/19/2024     Order Specific Question:   Lot#     Answer:   [id number]     Order Specific Question:   Manufacturer     Answer:   tolmar     Order Specific Question:   Charge Quantity?     Answer:   3     Order Specific Question:   Perfomed by/Witnessed by:     Answer:   mo     Order Specific Question:   NDC#     Answer:   [id number] [[id number]]       Patient did not supply own medication.      Diona Fanti, MA        Reviewed.     Karie Fetch, MD

## 2022-12-28 LAB — COMPREHENSIVE METABOLIC PANEL
ALT: 11 IU/L (ref 0–44)
AST: 15 IU/L (ref 0–40)
Albumin/Globulin Ratio: 1.4 (ref 1.2–2.2)
Albumin: 4.1 g/dL (ref 3.8–4.8)
Alkaline Phosphatase: 44 IU/L (ref 44–121)
BUN/Creatinine Ratio: 15 (ref 10–24)
BUN: 18 mg/dL (ref 8–27)
CO2: 20 mmol/L (ref 20–29)
Calcium: 9.6 mg/dL (ref 8.6–10.2)
Chloride: 102 mmol/L (ref 96–106)
Creatinine: 1.17 mg/dL (ref 0.76–1.27)
Est, Glomerular Filtration Rate: 63 mL/min/{1.73_m2} (ref >59)
Globulin, Total: 3 g/dL (ref 1.5–4.5)
Glucose: 99 mg/dL (ref 70–99)
Potassium: 3.9 mmol/L (ref 3.5–5.2)
Sodium: 141 mmol/L (ref 134–144)
Total Bilirubin: 0.5 mg/dL (ref 0.0–1.2)
Total Protein: 7.1 g/dL (ref 6.0–8.5)

## 2022-12-28 LAB — CBC
Hematocrit: 32.4 % — ABNORMAL LOW (ref 37.5–51.0)
Hemoglobin: 10.9 g/dL — ABNORMAL LOW (ref 13.0–17.7)
MCH: 31.7 pg (ref 26.6–33.0)
MCHC: 33.6 g/dL (ref 31.5–35.7)
MCV: 94 fL (ref 79–97)
Platelets: 271 10*3/uL (ref 150–450)
RBC: 3.44 x10E6/uL — ABNORMAL LOW (ref 4.14–5.80)
RDW: 12.1 % (ref 11.6–15.4)
WBC: 6.6 10*3/uL (ref 3.4–10.8)

## 2022-12-28 LAB — PROSTATE SPECIFIC ANTIGEN, TOTAL: PSA: <0.03 ng/mL (ref 0–4)

## 2022-12-28 LAB — TESTOSTERONE: Testosterone: <3 ng/dL — ABNORMAL LOW (ref 348–1197)

## 2022-12-29 LAB — VITAMIN D, 25 HYDROXY: Vit D, 25-Hydroxy: 27.8 ng/ml — ABNORMAL LOW (ref 30.0–100.0)

## 2022-12-29 NOTE — Other (Signed)
Please inform patient Vit D is low.  Start Vit D 2 (ergocalciferol) 50,000 international units weekly x 8 weeks then start Vit D2 or D3 2,000 international units daily

## 2022-12-30 ENCOUNTER — Encounter

## 2023-01-03 ENCOUNTER — Ambulatory Visit: Admit: 2023-01-03 | Discharge: 2023-01-03 | Payer: MEDICARE | Attending: Urology

## 2023-01-03 DIAGNOSIS — C61 Malignant neoplasm of prostate: Secondary | ICD-10-CM

## 2023-01-03 NOTE — Telephone Encounter (Signed)
Colton Cox has an order for [facility]      ALL MALE PATIENTS NEEDING AN MRI OF PELVIS OR PROSTATE, MUST BE SCHEDULED AT ONE OF THE FOLLOWING:    **MRI/CT (Preferred [facility])  **[facility] (Preferred [facility])  **[facility] (Preferred [facility])  [facility]  [facility]  [facility]      To be done at  [facility]    Needed by: June 25, 2023    Patient has a follow-up appointment:Yes     If MRI, does patient have a pacemaker:   No    Order has been placed in connect care:   Yes    Is this a STAT order:  No    Carolina Cellar, MA

## 2023-01-03 NOTE — Progress Notes (Signed)
Colton Cox  DOB June 04, 1946  01/03/2023    ASSESSMENT:    ICD-10-CM    1. 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 Urinary frequency  R35.0             - Upstaged [id number] hormone-sensitive prostate cancer  cT1cNxM0 GS 4+3, Prostate cancer in 9/12 cores, prostate volume of  49.57 cm3, Pre-biopsy PSA= 3.75ng/mL.                Bone scan 12/22/15: negative for metastatic prostate cancer.   CTU 2/17: negative              ADT initiated on 02/06/16 with Eligard 45 mg injection.  Single dose.              Completed XRT 03/18/16-04/23/16 with Dr. Shawnie Dapper               PSA nadir: 0.11 ng/ml (06/2018) with PSA back to baseline              DRE 03/21/2019: No evidence recurrence              PSMA PET 05/22/21: PyL binding in left internal iliac and right retrocrural lymph nodes compatible with metastatic prostate malignancy.              Restarted ADT on 06/17/21 with Degeralix 240 mg inj. Transition to Eligard 22.5 mg with most recent injection 12/27/22   Started Xtandi 07/2021 at 50% dosage 2/2 plavix usage              DEXA scan 09/24/20: Major osteoporotic compression fracture risk: 7.3%, Hip fracture risk: >3%   Completed salvage IMRT to the pelvic LN with Dr. Kris Mouton on 08/20/21 and SBRT to PSMA positive right retrocrural LN 10/15/2021  Most recent PSA was <0.03 ng/mL on 12/27/22     - BPH s/p ProTouch laser enucleation of the prostate with tissue morcellation on 01/14/2016. Taking Flomax 0.8 mg daily with no significant change compared to 0.4 mg                 - History of microscopic hematuria, 1+ blood and 5-10 RBC by UA on 11/11/15.              Urine cytology 10/2015 showed rare atypical urothelial cells.               CT Urogram 11/21/15 showed slightly attenuated and perhaps slightly thickened mid-distal right ureter/equivocally strictured, but without proximal dilatation. Rretrograde ureterography in OR 4/26 w/o evidence of filling  defect              No gross hematuria     - Vit D Deficiency with Vit D 14.7 (01/20/22), most recently improved s/p repletion with Vit D WNL 06/28/22   DEXA scan (09/24/21): Major osteoporotic compression fracture risk: 7.3%, Hip fracture risk: 2.4%. No Prolia indicated.       - History of TIA on Plavix 75 mg and aspirin 81 mg daily     - History of tobacco use       PLAN:  Reviewed most recent labs - PSA <0.03 ng/mL, T <3 ng/dL, Vit D 08.6 ng/mL, CBC and CMP on 12/27/22.  Will plan for patient to complete a total of 24 months ADT + Xtandi and order restaging PSMA PET/CT to be done in 6 months.  Patient to discontinue Flomax given no difference in voiding symptoms after decreasing dose.   Continue ADT with Eligard 22.5 mg injection for a total of 24 months. Next injection on 04/01/23, this will be patients last injection.  Will draw PSA and T at this nursing visit  Continue Ergocalciferol 50,000 units weekly.   Continue Xtandi 2 tabs daily for 6 more months.   Continue Myrbetriq 50 mg daily.   Follow up in 6 months with PSMA PET/CT, PSA, and T prior.  If PSMA reassuring will stop Xtandi and administer no further ADT      Chief Complaint   Patient presents with    Prostate Cancer       HPI: Colton Cox is a 77 y.o. male who is following up today for metastatic prostate cancer a condition that will require long term care. Initially diagnosed cT1cNxM0 GS 4+3 (Dx 12/12/15), Prostate cancer in 9/12 cores, prostate volume of  49.57 cm3, Pre-biopsy PSA= 3.75ng/mL s/p ADT and XRT completed 04/2016 and BPH s/p ProLEP 12/2015.       Patient reports 2-3x nocturia and urinary frequency. Patient reports that he is taking Myrbetriq and Flomax 0.4 mg daily. Patient did not notice a difference in his voiding symptoms after he decreased to 0.4 mg daily from 0.8 mg daily. Patient also reports that he continues taking Megace for hot flashes.       Following with oncology re: blood cell counts and started on iron/b12 supplements.     Reports  he thought he had a stroke ~8 years ago. Started plavix at this time.   Denies any episodes of seizures.     Past Medical History:   Diagnosis Date    Cerebral artery occlusion with cerebral infarction [facility])     Colon polyps     Hypertension     Personal history of prostate cancer        Past Surgical History:   Procedure Laterality Date    COLONOSCOPY  01/2018    HERNIA REPAIR      OTHER SURGICAL HISTORY  06/2016    Cat Scan: spot noted on lung    PR APPENDECTOMY      UROLOGICAL SURGERY N/A 01/14/2016    Cysto, Right RPG, and Protouch laser enucleation of the prostate with tissue morcellation - Dr. Tresa Endo       Current Outpatient Medications   Medication Sig Dispense Refill    tamsulosin (FLOMAX) 0.4 MG capsule Take 2 tabs by mouth daily 90 capsule 0    megestrol (MEGACE) 20 MG tablet Take 1 tablet by mouth daily 90 tablet 3    mirabegron (MYRBETRIQ) 50 MG TB24 Take 50 mg by mouth daily 90 tablet 3    enzalutamide (XTANDI) 40 MG capsule Take two capsules by mouth at bedtime each day. 60 capsule 5    vitamin D (ERGOCALCIFEROL) 1.25 MG (50000 UT) CAPS capsule Take 1 capsule by mouth once a week 12 capsule 3    Ferrous Sulfate Dried (HIGH POTENCY IRON) 65 MG TABS Take by mouth      Ascorbic Acid (VITAMIN C) 1000 MG tablet Take 1 tablet by mouth daily      Cyanocobalamin (VITAMIN B-12) 2500 MCG SUBL Place 1 tablet under the tongue daily      calcium carbonate 600 MG TABS tablet Take 1 tablet by mouth daily      Cholecalciferol 50 MCG (2000 UT) TABS Take by mouth      clopidogrel (PLAVIX) 75 MG  tablet Take by mouth      EPINEPHrine (EPIPEN) 0.3 MG/0.3ML SOAJ injection ceived the following from [facility]: Outside name: EPIPEN 2-PAK 0.3 mg/0.3 mL injection      hydroCHLOROthiazide (MICROZIDE) 12.5 MG capsule ceived the following from [facility]: Outside name: hydroCHLOROthiazide (MICROZIDE) 12.5 mg capsule      ibuprofen (ADVIL;MOTRIN) 800 MG tablet ceived the following from [facility]: Outside name: ibuprofen (MOTRIN) 800 mg tablet      losartan (COZAAR) 50 MG tablet ceived the following from [facility]: Outside name: losartan (COZAAR) 50 mg tablet      simvastatin (ZOCOR) 20 MG tablet ceived the following from [facility]: Outside name: simvastatin (ZOCOR) 20 mg tablet      Leuprolide Acetate, 3 Month, (ELIGARD) 22.5 MG KIT Inject 22.5 mg into the skin once for 1 dose 1 kit 0     No current facility-administered medications for this visit.       Allergies   Allergen Reactions    Bee Venom Shortness Of Breath       Family History   Problem Relation Age of Onset    Colon Cancer Father     Colon Cancer Mother        Social History     Socioeconomic History    Marital status: Married     Spouse name: None    Number of children: None    Years of education: None    Highest education level: None   Tobacco Use    Smoking status: Former    Smokeless tobacco: Former     Quit date: 09/20/1988   Substance and Sexual Activity    Alcohol use: Yes    Drug use: No         PHYSICAL EXAM:  There were no vitals filed for this visit.    GEN: NAD, alert and oriented  PULM: nl resp effort, no distress  PSYCH: Nl mood and affect    LABS:  No results found for this visit on 01/03/23.    PSA Trend     PSA /TESTOSTERONE - [facility] PSA Testosterone, total   Latest Ref Rng & Units 0 - 4 ng/mL 348 - 1197 ng/dL   09/25/1094 <0.45    12/27/8117 <0.03    06/28/2022 <0.1    04/02/2022 <0.03 < 3   01/20/2022 <0.03 < 3   10/05/2021 <0.03    06/17/2021 2.29 203   04/13/2021 2.16 187   09/26/2020 1.49     03/13/2020 1.16     12/20/2019 0.99     09/19/2019 1.39 270   06/21/2019 1.06      03/21/2019 0.66 208   12/27/2018 0.42     09/26/2018 0.25 187   07/20/2018 0.11 215   03/14/2018 0.11 184   10/05/2017 0.08 131   04/26/2017 0.04 51   01/25/2017 <0.03     10/26/2016 <0.03 <3 (L)   08/03/2016 <0.03 <3 (L)   11/03/2015 3.75     08/07/2015 5.40 (H)       Lab Results   Component Value Date/Time    VITD25 27.8 12/27/2022  10:54 AM     Lab Results   Component Value Date    WBC 6.6 12/27/2022    HGB 10.9 (L) 12/27/2022    HCT 32.4 (L) 12/27/2022    MCV 94 12/27/2022    PLT 271 12/27/2022  Lab Results   Component Value Date    NA 141 12/27/2022    K 3.9 12/27/2022    CL 102 12/27/2022    CO2 20 12/27/2022    BUN 18 12/27/2022    CREATININE 1.17 12/27/2022    GLUCOSE 99 12/27/2022    CALCIUM 9.6 12/27/2022    PROT 7.1 12/27/2022    LABALBU 4.1 12/27/2022    BILITOT 0.5 12/27/2022    ALKPHOS 44 12/27/2022    AST 15 12/27/2022    ALT 11 12/27/2022    LABGLOM 63 12/27/2022    AGRATIO 1.4 12/27/2022             Surgical Pathology 01/14/2016  FINAL DIAGNOSIS:   PROSTATE, TRANSURETHRAL RESECTION:   BENIGN NODULAR HYPERPLASIA     Prostate Biopsy Pathology:        result Gleason Grade cores   12/12/2015 4+3= 7 POS:9 Total:12      Biopsy Pathology 12/12/2015  DIAGNOSIS:   A. Right Apex Needle biopsy                  Benign prostatic tissue.   B. Right Mid Needle biopsy                  Benign prostatic tissue.   C. Right Base Needle biopsy                  ADENOCARCINOMA, GLEASON SCORE 3 + 3 = 6 involving 5 % of the specimen (1 of 1 core(s) positive).                Grade Group 1. Ends not involved by the tumor. Perineural invasion.   D. Right Lateral Apex Needle biopsy                  Benign prostatic tissue.   E. Right Lateral Mid Needle biopsy                  ADENOCARCINOMA, GLEASON SCORE 3 + 3 = 6 involving 10 % of the specimen (1 of 1 core(s) positive).                Grade Group 1. Ends not involved by the tumor.   F. Right Lateral Base Needle biopsy                  ADENOCARCINOMA, GLEASON SCORE 3 + 3 = 6 involving 5 % of the specimen (1 of 1 core(s) positive).                Grade Group 1. Ends not involved by the tumor.   G. Left Apex Needle biopsy                  ADENOCARCINOMA, GLEASON SCORE 4 + 3 = 7 involving 20 % of the specimen, discontinuous (1 of 1 core(s) positive).                Gleason 4 comprises 70 % of the cancer. Grade  Group 3. Ends not involved by the tumor.   H. Left Mid Needle biopsy                  ADENOCARCINOMA, GLEASON SCORE 3 + 4 = 7 involving 60 % of the specimen, discontinuous (1 of 1 core(s) positive).                Gleason 4 comprises 20 % of the cancer. Grade Group 2.  Tumor involves one end.   I. Left Base Needle biopsy                  ADENOCARCINOMA, GLEASON SCORE 4 + 3 = 7 involving 80 % of the specimen, discontinuous (1 of 1 core(s) positive).                Gleason 4 comprises 70 % of the cancer. Grade Group 3. Tumor involves one end. Perineural invasion.   J. Left Lateral Apex Needle biopsy                  ADENOCARCINOMA, GLEASON SCORE 3 + 4 = 7 involving 20 % of the specimen, discontinuous (1 of 1 core(s) positive).                Gleason 4 comprises 40 % of the cancer. Grade Group 2. Ends not involved by the tumor.   K. Left Lateral Mid Needle biopsy                  ADENOCARCINOMA, GLEASON SCORE 4 + 3 = 7 involving 60 % of the specimen (3 of 3 core(s) positive).                Gleason 4 comprises 70 % of the cancer. Grade Group 3. Tumor involves one end. Perineural invasion.   L. Left Lateral Base Needle biopsy                  ADENOCARCINOMA, GLEASON SCORE 4 + 3 = 7 involving 95 % of the specimen (1 of 1 core(s) positive).                Gleason 4 comprises 70 % of the cancer. Grade Group 3. Ends not involved by the tumor. Perineural invasion.     RADIOLOGY:    PSMA PET Scan 05/22/21  IMPRESSION   1. PyL binding in left internal iliac and right retrocrural lymph nodes compatible with metastatic prostate malignancy.    NM Bone Scan [facility] Body 12/22/2015  IMPRESSION:  No bone scan evidence of osseous metastatic disease.         Manual Meier, MD  [facility]  [address]  [place], [place] [address]  Phone: [phone]     Medical documentation is provided with the assistance of Marcene Brawn, medical scribe for Manual Meier, MD on 01/03/2023       [id number], Lurena Joiner, FNP

## 2023-01-10 NOTE — Telephone Encounter (Signed)
ORDER FILED TO BE SCHEDULED ONE MONTH PRIOR TO NEED BY DATE. UNLESS OTHERWISE STATED.

## 2023-01-17 NOTE — Telephone Encounter (Signed)
Med check for Oral Oncolytic:       Called pt to perform med check for oral oncolytic:  Xtandi      Are you taking your medication as prescribed?  yea                Have you had any change in blood pressure, swelling in extremities, or noticed a rash on any part of your body? no    Have you had any changes in ability to perform usual daily activities, changes in weight or appetite, changes in ability to sleep, increase in fatigue or pain in the last 30 days? no    Patient is receiving his medication from [facility] without difficulty. Patient has contact information for specialty pharmacy.     Patient has about a half a bottle of medication left.      Patient's next appointment with provider is scheduled for 07/05/23    Patient is up to date on all labs required per protocol for medication.      Patient advised to call the office if any issues arise.    Madie Reno

## 2023-01-27 NOTE — Telephone Encounter (Signed)
Recv'd email from [facility] stating that they can no longer fill this pt's Xtandi and the prescription has been triaged to [facility]. Called pt to inform that [facility] will no longer allow medication to be filled in our office through the IOD.    Patient can contact [facility] directly at [phone]. He has no questions or concerns at this time. Advised that he let us know if he gets close to running out of medication before he receives a new shipment.    Lona Millard, LPN

## 2023-02-01 NOTE — Telephone Encounter (Signed)
Call from [facility] pharmacy asking to, Clarify the Rx (if everything was transferred over correctly) or if you need to send in a new Rx for Xtandi.      [phone]   [id number]    WJX#[id number] CC

## 2023-02-01 NOTE — Telephone Encounter (Signed)
S/w rep at [facility], he stated clarification isn't required. They will go ahead and fill the script.     Lona Millard, LPN

## 2023-02-15 NOTE — Telephone Encounter (Signed)
Med check for Oral Oncolytic:     Called pt to perform med check for oral oncolytic: Xtandi      Are you taking your medication as prescribed?  Yes                Have you had any change in blood pressure, swelling in extremities, or noticed a rash on any part of your body? No    Have you had any changes in ability to perform usual daily activities, changes in weight or appetite, changes in ability to sleep, increase in fatigue or pain in the last 30 days? No    Patient is receiving his medication from [facility] without difficulty. Patient has contact information for specialty pharmacy.      Patient's next appointment with provider is scheduled for 07/05/23.     Patient advised to call the office if any issues arise.    Lona Millard, LPN

## 2023-03-14 MED ORDER — VITAMIN D (ERGOCALCIFEROL) 1.25 MG (50000 UT) PO CAPS
1.25 | ORAL_CAPSULE | ORAL | 0 refills | Status: AC
Start: 2023-03-14 — End: ?

## 2023-03-15 NOTE — Telephone Encounter (Signed)
Med check for Oral Oncolytic:     Called pt to perform med check for oral oncolytic: Xtandi      Pt did not answer call for med check. LVM advising he call back with any questions/concerns.     Eva Vallee, LPN

## 2023-03-29 ENCOUNTER — Encounter: Admit: 2023-03-29 | Discharge: 2023-03-30 | Payer: MEDICARE

## 2023-03-29 DIAGNOSIS — C61 Malignant neoplasm of prostate: Principal | ICD-10-CM

## 2023-03-29 MED ORDER — LEUPROLIDE ACETATE (3 MONTH) 22.5 MG SC KIT
22.5 | PACK | Freq: Once | SUBCUTANEOUS | 0 refills | Status: DC
Start: 2023-03-29 — End: 2024-01-31

## 2023-03-29 NOTE — Progress Notes (Signed)
Colton Cox is a 77 y.o. male who is here today per the order of Dr. Tresa Endo to receive Eligard and to have labs drawn.   Patient's identity has been verified.   Dr. Sinclair Grooms was available in the clinic as incident to provider.     Patient has been taking supplements of at least 500 mg of calcium and 400 international units of Vitamin D daily.  This is not his first ADT injection.  Last injection:12/27/2022.  He has  had the necessary labs drawn.  PSA, TST, and Vitamin D obtained via venipuncture without any difficulty.  Patient will be notified with lab results.    Eligard is administered to abdomen ( left) SQ without difficulty.     Patient tolerated the injection well.    Possible side effects reviewed with patient YES  Patient has been scheduled for the next injection which will be due in approximately 3 months  Patient has a follow-up appointment with provider on July 05, 2023      Encounter Diagnoses   Name Primary?    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    Vitamin D deficiency, unspecified        Orders Placed This Encounter   Procedures    Prostate Specific Antigen, Total    Testosterone    Vitamin D, 25 Hydroxy    PR COLLECTION VENOUS BLOOD VENIPUNCTURE    PR CHEMOTHER HORMON ANTINEOPL SUB-Q/IM    PR LEUPROLIDE ACETATE SUSPNSION     Order Specific Question:   Dose     Answer:   22.5mg      Order Specific Question:   Site     Answer:   LEFT LOWER QUAD ABDOMEN     Order Specific Question:   Expiration Date     Answer:   07/19/2024     Order Specific Question:   Lot#     Answer:   [id number]     Order Specific Question:   Manufacturer     Answer:   tolmar     Order Specific Question:   Charge Quantity?     Answer:   3     Order Specific Question:   Perfomed by/Witnessed by:     Answer:   mo     Order Specific Question:   NDC#     Answer:   30865-784-69 [629528]       Patient did not supply own medication.      Diona Fanti, MA          Chart reviewed and agree.  [name] Given MD

## 2023-03-30 LAB — PROSTATE SPECIFIC ANTIGEN, TOTAL: PSA: <0.03 ng/mL (ref 0–4)

## 2023-03-30 LAB — TESTOSTERONE: Testosterone: <3 ng/dL — ABNORMAL LOW (ref 348–1197)

## 2023-04-01 ENCOUNTER — Encounter

## 2023-04-07 LAB — VITAMIN D, 25 HYDROXY: Vit D, 25-Hydroxy: 41.2 ng/ml (ref 30.0–100.0)

## 2023-04-08 MED ORDER — XTANDI 40 MG PO CAPS
40 MG | ORAL_CAPSULE | ORAL | 5 refills | Status: AC
Start: 2023-04-08 — End: 2023-08-02

## 2023-04-08 NOTE — Telephone Encounter (Signed)
Patient is requesting a refill for Xtandi.     Pt is being seen every 6 month(s),was last seen on 01/03/23.      Per last office note patient is to continue taking the above medication and has a follow-up appointment scheduled for 07/05/23.    Patient is up to date on ADT utilizing Eligard 22.5mg .      Requested medication has been sent to the pharmacy on file.    Lona Millard, LPN

## 2023-04-12 NOTE — Telephone Encounter (Signed)
Med check for Oral Oncolytic:     Called pt to perform med check for oral oncolytic: Xtandi      Pt did not answer call for med check. LVM advising he call back with any questions/concerns.     Mckay Tegtmeyer, LPN

## 2023-05-10 NOTE — Progress Notes (Signed)
Pt has been on Xtandi since 02/2022 and per all med checks has been tolerating very well. Pt is up to date on ADT, follow up, and lab work. Pt has been moved to quarterly chart checks and has contact information for the navigation team should he have any questions/concerns/side effects.     Lona Millard, LPN

## 2023-05-25 NOTE — Telephone Encounter (Signed)
 Patient does not meet medicare guidelines for PSMA scan     Psa has to be detectable and also the last three need to be rising.     Patients current PSA trend is   <0.03 <0.03 CM <0.03

## 2023-06-03 MED ORDER — VITAMIN D (ERGOCALCIFEROL) 1.25 MG (50000 UT) PO CAPS
1.25 | ORAL_CAPSULE | ORAL | 0 refills | Status: DC
Start: 2023-06-03 — End: 2023-09-01

## 2023-06-27 ENCOUNTER — Other Ambulatory Visit: Admit: 2023-06-27 | Payer: MEDICARE

## 2023-06-27 DIAGNOSIS — C772 Secondary and unspecified malignant neoplasm of intra-abdominal lymph nodes: Secondary | ICD-10-CM

## 2023-06-27 LAB — PROSTATE SPECIFIC ANTIGEN, TOTAL: PSA: <0.03 ng/mL (ref 0–4)

## 2023-06-27 LAB — TESTOSTERONE: Testosterone: <3 ng/dL — ABNORMAL LOW (ref 348–1197)

## 2023-06-27 NOTE — Other (Signed)
Reviewed, will discuss at follow up

## 2023-06-27 NOTE — Progress Notes (Signed)
Colton Cox presents today for lab draw per Dr. Tresa Endo order.   Dr. Maple Hudson was present in the clinic as incident to.     PSA and TST obtained via venipuncture without any difficulty.    Patient will be notified with lab results.       Orders Placed This Encounter   Procedures    COLLECTION VENOUS BLOOD,VENIPUNCTURE    Prostate Specific Antigen, Total    Testosterone       Orie Rout, MA

## 2023-07-04 NOTE — Progress Notes (Signed)
Colton Cox  DOB Jul 02, 1946  77 y.o.    07/05/2023    ASSESSMENT:    ICD-10-CM    1. 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 None    C77.2 NM BONE SCAN WHOLE BODY      2. BPH with obstruction/lower urinary tract symptoms  N40.1 AMB POC PVR, MEAS,POST-VOID RES,US,NON-IMAGING    N13.8         - [id number] hormone-sensitive prostate cancer  cT1cNxM0 GS 4+3, Prostate cancer in 9/12 cores, prostate volume of  49.57 cm3, Pre-biopsy PSA= 3.75ng/mL.                Bone scan 12/22/15: negative for metastatic prostate cancer.   CTU 2/17: negative              ADT initiated on 02/06/16 with Eligard 45 mg injection.  Single dose.              Completed XRT 03/18/16-04/23/16 with Dr. Shawnie Dapper               PSA nadir: 0.11 ng/ml (06/2018) with PSA back to baseline              DRE 03/21/2019: No evidence recurrence              PSMA PET 05/22/21: PyL binding in left internal iliac and right retrocrural lymph nodes compatible with metastatic prostate malignancy.   PSA 2.29 ng/ml, T 79 ng/dl on 12/27/77              Restarted ADT on 06/17/21 with Degeralix 240 mg inj. Transition to Eligard 22.5 mg with final planned injection on 03/29/23   Started Xtandi 07/2021 at 50% dosage 2/2 plavix usage              DEXA scan 09/24/20: Major osteoporotic compression fracture risk: 7.3%, Hip fracture risk: >3%   Completed salvage IMRT to the pelvic LN with Dr. Kris Mouton on 08/20/21 and SBRT to PSMA positive right retrocrural LN 10/15/21  Current Disease Status: Stable. Most recent PSA was <0.03 ng/mL on 06/27/23, Testosterone <3 ng/dl     - Hot flashes 2/2 ADT. Utilizing Megace.     - BPH s/p ProTouch laser enucleation of the prostate with tissue morcellation on 01/14/16. On Myrbetriq 50mg  daily.  PVR 10cc on 07/05/23                 - History of microscopic hematuria, no gross hematuria. Risk factor: former smoker  Urine cytology 10/2015 showed rare atypical urothelial cells.    CTU 11/21/15 showed slightly attenuated and perhaps slightly thickened mid-distal right ureter/equivocally strictured, but without proximal dilatation.   Retrograde ureterography in OR 01/14/16 w/o evidence of filling defect           - History of Vit D deficiency    DEXA scan (09/24/21): Major osteoporotic compression fracture risk: 7.3%, Hip fracture risk: 2.4%. No Prolia indicated.       - History of TIA, on Plavix 75 mg and ASA 81 mg daily     PLAN:  Reviewed 06/27/23 labs with patient: PSA <0.03 ng/ml, Testosterone <3 ng/dl  PSMA denied by Medicare due to not meeting Medicare guidelines of rising PSA   CT a/p wwo co and Bone scan now for reassessment. If imaging reassuring, will stop Xtandi at that time and administer no further ADT and proceed with PSA surveillance.   Continue Vit  D/Calc supplementation while testosterone remains castrate   Continue Myrbetriq 50 mg daily  Continue Megace 20mg  PO daily for hot flashes   Follow up in 3-4 weeks to review imaging; sooner if problems. OK for TM.    I am following the established plan for Dr. Tresa Endo and Dr. Sinclair Grooms is the supervising physician for this day.    Patient is receiving long term care for aforementioned conditions as outlined above.       Chief Complaint   Patient presents with    Prostate Cancer     HPI: Colton Cox is a pleasant 77 y.o. caucasian male who presents today in follow up for metastatic prostate cancer and BPH/LUTS. Initially diagnosed cT1cNxM0 GS 4+3 (Dx 12/12/15), Prostate cancer in 9/12 cores, prostate volume of  49.57 cm3, Pre-biopsy PSA= 3.75ng/mL s/p ADT and XRT completed 04/2016 and BPH s/p ProLEP 12/2015.   Had BCR. PSMA PET 05/22/21 demonstrated PyL binding in left internal iliac and right retrocrural lymph nodes compatible with metastatic prostate malignancy. Restarted ADT on 06/17/21. Started Xtandi 07/2021 at 50% dosage 2/2 plavix usage. Completed salvage IMRT to the pelvic LN with Dr. Kris Mouton on 08/20/21 and SBRT to PSMA positive right  retrocrural LN 10/15/21    Patient presents today doing well from urologic standpoint. Unfortunately, he did suffer a fall while getting out of his truck and reportedly fractured a vertebrae and rupture disc per pt. He is followed by [facility]. He has discontinued Flomax since last office visit with no changes to voiding habits. Remains on Myrbetriq 50mg  daily. Stable LUTS. No gross hematuria, dysuria.     Patient reports that he continues taking Megace for hot flashes. He also endorses loss in muscle mass since start of ADT/Xtandi.     Following with oncology re: blood cell counts and started on iron/B12 supplements.     Denies any episodes of seizures.     Past Medical History:   Diagnosis Date    Cerebral artery occlusion with cerebral infarction [facility])     Colon polyps     Hypertension     Personal history of prostate cancer        Past Surgical History:   Procedure Laterality Date    COLONOSCOPY  01/2018    HERNIA REPAIR      OTHER SURGICAL HISTORY  06/2016    Cat Scan: spot noted on lung    PR APPENDECTOMY      UROLOGICAL SURGERY N/A 01/14/2016    Cysto, Right RPG, and Protouch laser enucleation of the prostate with tissue morcellation - Dr. Tresa Endo       Current Outpatient Medications   Medication Sig Dispense Refill    vitamin D (ERGOCALCIFEROL) 1.25 MG (50000 UT) CAPS capsule Take 1 capsule by mouth once a week 12 capsule 0    enzalutamide (XTANDI) 40 MG capsule TAKE 2 CAPSULES (80 MG) ONCE NIGHTLY AT BEDTIME 60 capsule 5    megestrol (MEGACE) 20 MG tablet Take 1 tablet by mouth daily 90 tablet 3    mirabegron (MYRBETRIQ) 50 MG TB24 Take 50 mg by mouth daily 90 tablet 3    Ferrous Sulfate Dried (HIGH POTENCY IRON) 65 MG TABS Take by mouth      Ascorbic Acid (VITAMIN C) 1000 MG tablet Take 1 tablet by mouth daily      Cyanocobalamin (VITAMIN B-12) 2500 MCG SUBL Place 1 tablet under the tongue daily      calcium carbonate 600 MG TABS tablet Take 1  tablet by mouth daily      Cholecalciferol 50 MCG (2000 UT)  TABS Take by mouth      clopidogrel (PLAVIX) 75 MG tablet Take by mouth      EPINEPHrine (EPIPEN) 0.3 MG/0.3ML SOAJ injection ceived the following from [facility]: Outside name: EPIPEN 2-PAK 0.3 mg/0.3 mL injection      hydroCHLOROthiazide (MICROZIDE) 12.5 MG capsule ceived the following from [facility]: Outside name: hydroCHLOROthiazide (MICROZIDE) 12.5 mg capsule      ibuprofen (ADVIL;MOTRIN) 800 MG tablet ceived the following from [facility]: Outside name: ibuprofen (MOTRIN) 800 mg tablet      losartan (COZAAR) 50 MG tablet ceived the following from [facility]: Outside name: losartan (COZAAR) 50 mg tablet      simvastatin (ZOCOR) 20 MG tablet ceived the following from [facility]: Outside name: simvastatin (ZOCOR) 20 mg tablet      Leuprolide Acetate, 3 Month, (ELIGARD) 22.5 MG KIT Inject 22.5 mg into the skin once for 1 dose 1 kit 0     No current facility-administered medications for this visit.       Allergies   Allergen Reactions    Bee Venom Shortness Of Breath       Family History   Problem Relation Age of Onset    Colon Cancer Father     Colon Cancer Mother        Social History     Socioeconomic History    Marital status: Married     Spouse name: None    Number of children: None    Years of education: None    Highest education level: None   Tobacco Use    Smoking status: Former    Smokeless tobacco: Former     Quit date: 09/20/1988   Substance and Sexual Activity    Alcohol use: Yes    Drug use: No       PHYSICAL EXAM:  There were no vitals filed for this visit.    GEN: NAD, alert and oriented  PULM: nl resp effort, no distress  PSYCH: Nl mood and affect    LABS:  Results for orders placed or performed in visit on 07/05/23   AMB POC PVR, MEAS,POST-VOID RES,US,NON-IMAGING   Result Value Ref Range    PVR, POC 10 cc       PSA Trend      [facility] GENER[facility]     PSA Testosterone   Latest Ref Rng 0 - 4 ng/mL 348 - 1197 ng/dL   1/61/0960  4.54  098 (L)    07/20/2018 0.11  215 (L)    09/26/2018 0.25  187 (L)    12/27/2018 0.42     03/21/2019 0.66  208 (L)    06/21/2019 1.06     09/19/2019 1.39  270 (L)    12/20/2019 0.99     03/13/2020 1.16     09/26/2020 1.49     04/13/2021 2.16  187 (L)    06/17/2021 2.29  203 (L)    10/05/2021 <0.03  <3 (L)    01/20/2022 <0.03  <3 (L)    04/02/2022 <0.03  <3 (L)    06/28/2022 <0.1  <3 (L)    10/01/2022 <0.03  <3 (L)    12/27/2022 <0.03  <3 (L)    03/29/2023 <0.03  <3 (L)    06/27/2023 <0.03  <3 (L)  Lab Results   Component Value Date/Time    VITD25 41.2 03/29/2023 03:24 PM     Lab Results   Component Value Date    WBC 6.6 12/27/2022    HGB 10.9 (L) 12/27/2022    HCT 32.4 (L) 12/27/2022    MCV 94 12/27/2022    PLT 271 12/27/2022     Lab Results   Component Value Date    NA 141 12/27/2022    K 3.9 12/27/2022    CL 102 12/27/2022    CO2 20 12/27/2022    BUN 18 12/27/2022    CREATININE 1.17 12/27/2022    GLUCOSE 99 12/27/2022    CALCIUM 9.6 12/27/2022    BILITOT 0.5 12/27/2022    ALKPHOS 44 12/27/2022    AST 15 12/27/2022    ALT 11 12/27/2022    LABGLOM 63 12/27/2022    AGRATIO 1.4 12/27/2022       Surgical Pathology 01/14/2016  FINAL DIAGNOSIS:   PROSTATE, TRANSURETHRAL RESECTION:   BENIGN NODULAR HYPERPLASIA     Prostate Biopsy Pathology:        result Gleason Grade cores   12/12/2015 4+3= 7 POS:9 Total:12      Biopsy Pathology 12/12/2015  DIAGNOSIS:   A. Right Apex Needle biopsy                  Benign prostatic tissue.   B. Right Mid Needle biopsy                  Benign prostatic tissue.   C. Right Base Needle biopsy                  ADENOCARCINOMA, GLEASON SCORE 3 + 3 = 6 involving 5 % of the specimen (1 of 1 core(s) positive).                Grade Group 1. Ends not involved by the tumor. Perineural invasion.   D. Right Lateral Apex Needle biopsy                  Benign prostatic tissue.   E. Right Lateral Mid Needle biopsy                  ADENOCARCINOMA, GLEASON SCORE 3 + 3 = 6 involving 10 % of the specimen (1 of 1 core(s) positive).                 Grade Group 1. Ends not involved by the tumor.   F. Right Lateral Base Needle biopsy                  ADENOCARCINOMA, GLEASON SCORE 3 + 3 = 6 involving 5 % of the specimen (1 of 1 core(s) positive).                Grade Group 1. Ends not involved by the tumor.   G. Left Apex Needle biopsy                  ADENOCARCINOMA, GLEASON SCORE 4 + 3 = 7 involving 20 % of the specimen, discontinuous (1 of 1 core(s) positive).                Gleason 4 comprises 70 % of the cancer. Grade Group 3. Ends not involved by the tumor.   H. Left Mid Needle biopsy                  ADENOCARCINOMA, GLEASON  SCORE 3 + 4 = 7 involving 60 % of the specimen, discontinuous (1 of 1 core(s) positive).                Gleason 4 comprises 20 % of the cancer. Grade Group 2. Tumor involves one end.   I. Left Base Needle biopsy                  ADENOCARCINOMA, GLEASON SCORE 4 + 3 = 7 involving 80 % of the specimen, discontinuous (1 of 1 core(s) positive).                Gleason 4 comprises 70 % of the cancer. Grade Group 3. Tumor involves one end. Perineural invasion.   J. Left Lateral Apex Needle biopsy                  ADENOCARCINOMA, GLEASON SCORE 3 + 4 = 7 involving 20 % of the specimen, discontinuous (1 of 1 core(s) positive).                Gleason 4 comprises 40 % of the cancer. Grade Group 2. Ends not involved by the tumor.   K. Left Lateral Mid Needle biopsy                  ADENOCARCINOMA, GLEASON SCORE 4 + 3 = 7 involving 60 % of the specimen (3 of 3 core(s) positive).                Gleason 4 comprises 70 % of the cancer. Grade Group 3. Tumor involves one end. Perineural invasion.   L. Left Lateral Base Needle biopsy                  ADENOCARCINOMA, GLEASON SCORE 4 + 3 = 7 involving 95 % of the specimen (1 of 1 core(s) positive).                Gleason 4 comprises 70 % of the cancer. Grade Group 3. Ends not involved by the tumor. Perineural invasion.     RADIOLOGY:    PSMA PET Scan 05/22/21  IMPRESSION   1. PyL binding in left  internal iliac and right retrocrural lymph nodes compatible with metastatic prostate malignancy.    NM Bone Scan [facility] Body 12/22/2015  IMPRESSION:  No bone scan evidence of osseous metastatic disease.         Blane Ohara, PA-C  [facility]  [address]  [place], [place] [address]  [phone] Main Number  [phone] Fax     [id number], Lurena Joiner, FNP

## 2023-07-05 ENCOUNTER — Ambulatory Visit: Admit: 2023-07-05 | Payer: MEDICARE | Attending: Student in an Organized Health Care Education/Training Program

## 2023-07-05 DIAGNOSIS — C772 Secondary and unspecified malignant neoplasm of intra-abdominal lymph nodes: Secondary | ICD-10-CM

## 2023-07-05 LAB — AMB POC PVR, MEAS,POST-VOID RES,US,NON-IMAGING: PVR, POC: 10 mL

## 2023-07-05 NOTE — Progress Notes (Signed)
Colton Cox has an order for NM BONE SCAN WHOLE BODY and   CT ABDOMEN PELVIS W WO CONTRAST Additional Contrast? None     ALL MALE PATIENTS NEEDING AN MRI OF PELVIS OR PROSTATE, MUST BE SCHEDULED AT ONE OF THE FOLLOWING:    **MRI/CT (Preferred [facility])  **[facility] (Preferred [facility])  **[facility] (Preferred [place])  [facility]  [facility]  [facility]      To be done at [facility]    Needed by: Now    Patient has a follow-up appointment:Yes August 02, 2023    If MRI, does patient have a pacemaker:   No    Order has been placed in connect care:   Yes    Is this a STAT order:  No    [name]

## 2023-07-05 NOTE — Progress Notes (Signed)
 Faxed imaging to [facility]; [facility] [phone];   (Please allow at least 72 hours for processing) PATIENT MAY CALL [phone] TO SCHEDULE

## 2023-07-13 NOTE — Progress Notes (Signed)
CT and BS is scheduled for Jul 27 2023 at [facility] [place]

## 2023-07-28 ENCOUNTER — Encounter

## 2023-07-28 NOTE — Other (Signed)
Noted. Will review result with the patient at scheduled follow up. Note shared to the patient's MyChart.     Filiberto Pinks, PA-C

## 2023-08-01 NOTE — Progress Notes (Unsigned)
TELEMEDICINE VISIT  Colton Cox  DOB 04-14-1946  77 y.o.    08/02/2023    ASSESSMENT:    ICD-10-CM    1. 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                Bone scan 12/22/15: negative for metastatic prostate cancer.   CTU 2/17: negative              ADT initiated on 02/06/16 with Eligard 45 mg injection.  Single dose.              Completed XRT 03/18/16-04/23/16 with Dr. Shawnie Dapper               PSA nadir: 0.11 ng/ml (06/2018) with PSA back to baseline              DRE 03/21/2019: No evidence recurrence              PSMA PET 05/22/21: PyL binding in left internal iliac and right retrocrural lymph nodes compatible with metastatic prostate malignancy.   PSA 2.29 ng/ml, T 203 ng/dl on 6/94/85              Restarted ADT on 06/17/21 with Degeralix 240 mg inj. Transition to Eligard 22.5 mg with final planned injection on 03/29/23   Started Xtandi 07/2021 at 50% dosage 2/2 plavix usage              DEXA scan 09/24/20: Major osteoporotic compression fracture risk: 7.3%, Hip fracture risk: >3%   Completed salvage IMRT to the pelvic LN with Dr. Kris Mouton on 08/20/21 and SBRT to PSMA positive right retrocrural LN 10/15/21  PSMA denied by Medicare due to not meeting Medicare guidelines of rising PSA   Bone scan 07/27/23: No definite scintigraphic evidence of osseous metastatic disease. Increased radiotracer activity at the right anterior ninth and tenth ribs, which likely corresponds to healing rib fractures. Moderately increased activity is noted at the L1 vertebral level from compression fracture as seen on CT. Additional sites of likely degenerative changes/arthritis.  CT a/p 07/27/23: ***  Current Disease Status: Stable. Most recent PSA was <0.03 ng/mL on 06/27/23, Testosterone <3 ng/dl     - Hot flashes 2/2 ADT. Utilizing Megace.     -  BPH s/p ProTouch laser enucleation of the prostate with tissue morcellation on 01/14/16. On Myrbetriq 50mg  daily.  PVR 10cc on 07/05/23                 - History of microscopic hematuria, no gross hematuria. Risk factor: former smoker  Urine cytology 10/2015 showed rare atypical urothelial cells.   CTU 11/21/15 showed slightly attenuated and perhaps slightly thickened mid-distal right ureter/equivocally strictured, but without proximal dilatation.   Retrograde ureterography in OR 01/14/16 w/o evidence of filling defect           - History of Vit D deficiency    DEXA scan (09/24/21): Major osteoporotic compression fracture risk: 7.3%, Hip fracture risk: 2.4%. No Prolia indicated.       - History of TIA, on Plavix 75 mg and ASA 81 mg daily     PLAN:  Reviewed 06/27/23 labs with patient: PSA <0.03 ng/ml, Testosterone <3 ng/dl  PSMA denied by Medicare due to not meeting  Medicare guidelines of rising PSA   CT a/p wwo co and Bone scan now for reassessment. If imaging reassuring, will stop Xtandi at that time and administer no further ADT and proceed with PSA surveillance.   Continue Vit D/Calc supplementation while testosterone remains castrate   Continue Myrbetriq 50 mg daily  Continue Megace 20mg  PO daily for hot flashes   Follow up in 3-4 weeks to review imaging; sooner if problems. OK for TM.    I am following the established plan for Dr. Tresa Endo and Dr. Marland Kitchen is the supervising physician for this day.    Patient is receiving long term care for aforementioned conditions as outlined above.       No chief complaint on file.    HPI: Colton Cox is a pleasant 77 y.o. caucasian male who presents today in follow up for metastatic prostate cancer and BPH/LUTS. Initially diagnosed cT1cNxM0 GS 4+3 (Dx 12/12/15), Prostate cancer in 9/12 cores, prostate volume of  49.57 cm3, Pre-biopsy PSA= 3.75ng/mL s/p ADT and XRT completed 04/2016 and BPH s/p ProLEP 12/2015.   Had BCR. PSMA PET 05/22/21 demonstrated PyL binding in left internal iliac and  right retrocrural lymph nodes compatible with metastatic prostate malignancy. Restarted ADT on 06/17/21. Started Xtandi 07/2021 at 50% dosage 2/2 plavix usage. Completed salvage IMRT to the pelvic LN with Dr. Kris Mouton on 08/20/21 and SBRT to PSMA positive right retrocrural LN 10/15/21    Patient presents today doing well from urologic standpoint. Unfortunately, he did suffer a fall while getting out of his truck and reportedly fractured a vertebrae and rupture disc per pt. He is followed by [facility]. He has discontinued Flomax since last office visit with no changes to voiding habits. Remains on Myrbetriq 50mg  daily. Stable LUTS. No gross hematuria, dysuria.     Patient reports that he continues taking Megace for hot flashes. He also endorses loss in muscle mass since start of ADT/Xtandi.     Following with oncology re: blood cell counts and started on iron/B12 supplements.     Denies any episodes of seizures.     Past Medical History:   Diagnosis Date    Cerebral artery occlusion with cerebral infarction [facility]     Colon polyps     Hypertension     Personal history of prostate cancer        Past Surgical History:   Procedure Laterality Date    COLONOSCOPY  01/2018    HERNIA REPAIR      OTHER SURGICAL HISTORY  06/2016    Cat Scan: spot noted on lung    PR APPENDECTOMY      UROLOGICAL SURGERY N/A 01/14/2016    Cysto, Right RPG, and Protouch laser enucleation of the prostate with tissue morcellation - Dr. Tresa Endo       Current Outpatient Medications   Medication Sig Dispense Refill    vitamin D (ERGOCALCIFEROL) 1.25 MG (50000 UT) CAPS capsule Take 1 capsule by mouth once a week 12 capsule 0    enzalutamide (XTANDI) 40 MG capsule TAKE 2 CAPSULES (80 MG) ONCE NIGHTLY AT BEDTIME 60 capsule 5    Leuprolide Acetate, 3 Month, (ELIGARD) 22.5 MG KIT Inject 22.5 mg into the skin once for 1 dose 1 kit 0    megestrol (MEGACE) 20 MG tablet Take 1 tablet by mouth daily 90 tablet 3    mirabegron (MYRBETRIQ) 50 MG TB24 Take 50 mg  by mouth daily 90 tablet 3    Ferrous Sulfate Dried (HIGH  POTENCY IRON) 65 MG TABS Take by mouth      Ascorbic Acid (VITAMIN C) 1000 MG tablet Take 1 tablet by mouth daily      Cyanocobalamin (VITAMIN B-12) 2500 MCG SUBL Place 1 tablet under the tongue daily      calcium carbonate 600 MG TABS tablet Take 1 tablet by mouth daily      Cholecalciferol 50 MCG (2000 UT) TABS Take by mouth      clopidogrel (PLAVIX) 75 MG tablet Take by mouth      EPINEPHrine (EPIPEN) 0.3 MG/0.3ML SOAJ injection ceived the following from [facility]: Outside name: EPIPEN 2-PAK 0.3 mg/0.3 mL injection      hydroCHLOROthiazide (MICROZIDE) 12.5 MG capsule ceived the following from [facility]: Outside name: hydroCHLOROthiazide (MICROZIDE) 12.5 mg capsule      ibuprofen (ADVIL;MOTRIN) 800 MG tablet ceived the following from [facility]: Outside name: ibuprofen (MOTRIN) 800 mg tablet      losartan (COZAAR) 50 MG tablet ceived the following from [facility]: Outside name: losartan (COZAAR) 50 mg tablet      simvastatin (ZOCOR) 20 MG tablet ceived the following from [facility]: Outside name: simvastatin (ZOCOR) 20 mg tablet       No current facility-administered medications for this visit.       Allergies   Allergen Reactions    Bee Venom Shortness Of Breath       Family History   Problem Relation Age of Onset    Colon Cancer Father     Colon Cancer Mother        Social History     Socioeconomic History    Marital status: Married   Tobacco Use    Smoking status: Former    Smokeless tobacco: Former     Quit date: 09/20/1988   Substance and Sexual Activity    Alcohol use: Yes    Drug use: No       PHYSICAL EXAM:  There were no vitals filed for this visit.    GEN: NAD, alert and oriented  PULM: nl resp effort, no distress  PSYCH: Nl mood and affect    LABS:  No results found for this visit on 08/02/23.      PSA Trend      [facility]     PSA Testosterone   Latest Ref Rng  0 - 4 ng/mL 348 - 1197 ng/dL   1/91/4782 9.56  213 (L)    07/20/2018 0.11  215 (L)    09/26/2018 0.25  187 (L)    12/27/2018 0.42     03/21/2019 0.66  208 (L)    06/21/2019 1.06     09/19/2019 1.39  270 (L)    12/20/2019 0.99     03/13/2020 1.16     09/26/2020 1.49     04/13/2021 2.16  187 (L)    06/17/2021 2.29  203 (L)    10/05/2021 <0.03  <3 (L)    01/20/2022 <0.03  <3 (L)    04/02/2022 <0.03  <3 (L)    06/28/2022 <0.1  <3 (L)    10/01/2022 <0.03  <3 (L)    12/27/2022 <0.03  <3 (L)    03/29/2023 <0.03  <3 (L)    06/27/2023 <0.03  <3 (L)      Lab Results   Component Value Date/Time    VITD25 41.2 03/29/2023 03:24 PM  Lab Results   Component Value Date    WBC 6.6 12/27/2022    HGB 10.9 (L) 12/27/2022    HCT 32.4 (L) 12/27/2022    MCV 94 12/27/2022    PLT 271 12/27/2022     Lab Results   Component Value Date    NA 141 12/27/2022    K 3.9 12/27/2022    CL 102 12/27/2022    CO2 20 12/27/2022    BUN 18 12/27/2022    CREATININE 1.17 12/27/2022    GLUCOSE 99 12/27/2022    CALCIUM 9.6 12/27/2022    BILITOT 0.5 12/27/2022    ALKPHOS 44 12/27/2022    AST 15 12/27/2022    ALT 11 12/27/2022    LABGLOM 63 12/27/2022    AGRATIO 1.4 12/27/2022       Surgical Pathology 01/14/2016  FINAL DIAGNOSIS:   PROSTATE, TRANSURETHRAL RESECTION:   BENIGN NODULAR HYPERPLASIA     Prostate Biopsy Pathology:        result Gleason Grade cores   12/12/2015 4+3= 7 POS:9 Total:12      Biopsy Pathology 12/12/2015  DIAGNOSIS:   A. Right Apex Needle biopsy                  Benign prostatic tissue.   B. Right Mid Needle biopsy                  Benign prostatic tissue.   C. Right Base Needle biopsy                  ADENOCARCINOMA, GLEASON SCORE 3 + 3 = 6 involving 5 % of the specimen (1 of 1 core(s) positive).                Grade Group 1. Ends not involved by the tumor. Perineural invasion.   D. Right Lateral Apex Needle biopsy                  Benign prostatic tissue.   E. Right Lateral Mid Needle biopsy                  ADENOCARCINOMA, GLEASON SCORE 3 + 3 = 6 involving 10 %  of the specimen (1 of 1 core(s) positive).                Grade Group 1. Ends not involved by the tumor.   F. Right Lateral Base Needle biopsy                  ADENOCARCINOMA, GLEASON SCORE 3 + 3 = 6 involving 5 % of the specimen (1 of 1 core(s) positive).                Grade Group 1. Ends not involved by the tumor.   G. Left Apex Needle biopsy                  ADENOCARCINOMA, GLEASON SCORE 4 + 3 = 7 involving 20 % of the specimen, discontinuous (1 of 1 core(s) positive).                Gleason 4 comprises 70 % of the cancer. Grade Group 3. Ends not involved by the tumor.   H. Left Mid Needle biopsy                  ADENOCARCINOMA, GLEASON SCORE 3 + 4 = 7 involving 60 % of the specimen, discontinuous (1 of 1 core(s) positive).  Gleason 4 comprises 20 % of the cancer. Grade Group 2. Tumor involves one end.   I. Left Base Needle biopsy                  ADENOCARCINOMA, GLEASON SCORE 4 + 3 = 7 involving 80 % of the specimen, discontinuous (1 of 1 core(s) positive).                Gleason 4 comprises 70 % of the cancer. Grade Group 3. Tumor involves one end. Perineural invasion.   J. Left Lateral Apex Needle biopsy                  ADENOCARCINOMA, GLEASON SCORE 3 + 4 = 7 involving 20 % of the specimen, discontinuous (1 of 1 core(s) positive).                Gleason 4 comprises 40 % of the cancer. Grade Group 2. Ends not involved by the tumor.   K. Left Lateral Mid Needle biopsy                  ADENOCARCINOMA, GLEASON SCORE 4 + 3 = 7 involving 60 % of the specimen (3 of 3 core(s) positive).                Gleason 4 comprises 70 % of the cancer. Grade Group 3. Tumor involves one end. Perineural invasion.   L. Left Lateral Base Needle biopsy                  ADENOCARCINOMA, GLEASON SCORE 4 + 3 = 7 involving 95 % of the specimen (1 of 1 core(s) positive).                Gleason 4 comprises 70 % of the cancer. Grade Group 3. Ends not involved by the tumor. Perineural invasion.     RADIOLOGY:  CT A/P  07/27/23:  ***    Bone scan 07/27/23:  FINDINGS:      Focal, increased radiotracer activity at contiguous, lower anterior right rib cage at the ninth and tenth ribs, which appear to correspond to healing rib fractures on CT.      Moderately increased radiotracer activity is also noted in the lower thoracic/upper lumbar spine. On corresponding CT, this activity likely attributable to L1 compression fracture and degenerative changes.      Milder focal activity at the left 12th costovertebral margin, with fracture callous on CT.      There are sites of likely arthritic/degenerative changes at the joints of the extremities, most notably at the bilateral shoulders and left greater than right ankle. Small foci of trace radiotracer activity at the bilateral knees also likely demonstrate degenerative changes as seen on prior knee radiographs.      Increased activity at approximately the location of the bilateral first MTP joints, with right sided predominance, also likely represents degenerative changes. '      There is physiologic soft tissue radiotracer activity in the kidneys and urinary bladder.      IMPRESSION:      1.No definite scintigraphic evidence of osseous metastatic disease.   2.         There is increased radiotracer activity at the right anterior ninth and tenth ribs, which likely corresponds to healing rib fractures.   3.Moderately increased activity is noted at the L1 vertebral level from compression fracture as seen on CT.   4.Additional sites of likely degenerative changes/arthritis are noted  and described above.       PSMA PET Scan 05/22/21  IMPRESSION   1. PyL binding in left internal iliac and right retrocrural lymph nodes compatible with metastatic prostate malignancy.    NM Bone Scan [facility] 12/22/2015  IMPRESSION:  No bone scan evidence of osseous metastatic disease.         Blane Ohara, PA-C  [facility]  [address]  [place], [place] [address]  [phone] Main Number  [phone] Fax      [id number], Lurena Joiner, FNP    Patient is {Blank Multiple Select Template:20062::"located in their home (which is a location other than a hospital or other facility where the patient receives care in a private residence)","not located in their home"} when receiving health services or health related services through telecommunication technology.     Ivar Drape, was evaluated through a synchronous (real-time) audio-video encounter. The patient (or guardian if applicable) is aware that this is a billable service, which includes applicable co-pays. This Virtual Visit was conducted with patient's (and/or legal guardian's) consent. The visit was conducted pursuant to the emergency declaration under the [employer] and the [employer], 1135 waiver authority and the [employer] and [employer] Act. It is within this context (and with the understanding that this method of patient encounter is in the patient's best interest as well as the health and safety of other patients and the public) that telehealth is being provided for this patient encounter rather than a face-to-face visit. This patient encounter is appropriate and reasonable under the circumstances given the patient's particular presentation at this time. The patient has been advised of the potential risks and limitations of this mode of treatment (including, but not limited to, the absence of in-person examination) and has agreed to be treated in a remote fashion. The patient has also been advised to contact this office for worsening conditions or problems, and seek emergency medical treatment and/or call 911 if the patient deems either necessary. Patient identification was verified, and a caregiver was present when appropriate, prior to the initiation of the visit.

## 2023-08-02 ENCOUNTER — Encounter: Attending: Student in an Organized Health Care Education/Training Program

## 2023-08-02 DIAGNOSIS — C772 Secondary and unspecified malignant neoplasm of intra-abdominal lymph nodes: Secondary | ICD-10-CM

## 2023-08-02 NOTE — Addendum Note (Signed)
 Addended by: Lona Millard on: 08/02/2023 04:53 PM     Modules accepted: Orders

## 2023-09-01 MED ORDER — VITAMIN D (ERGOCALCIFEROL) 1.25 MG (50000 UT) PO CAPS
1.25 | ORAL_CAPSULE | ORAL | 1 refills | 80.50000 days | Status: DC
Start: 2023-09-01 — End: 2024-03-19

## 2023-11-02 ENCOUNTER — Other Ambulatory Visit: Admit: 2023-11-02 | Discharge: 2023-11-02 | Payer: MEDICARE

## 2023-11-02 DIAGNOSIS — C61 Malignant neoplasm of prostate: Secondary | ICD-10-CM

## 2023-11-02 NOTE — Progress Notes (Signed)
Colton Cox presents today for lab draw per Dr. Barnie Alderman order.   Dr. Rosiland Oz was present in the clinic as incident to.     PSA, TST, and Vitamin D obtained via venipuncture without any difficulty.    Patient will be notified with lab results.       Orders Placed This Encounter   Procedures    COLLECTION VENOUS BLOOD,VENIPUNCTURE    Prostate Specific Antigen, Total    Vitamin D, 25 Hydroxy    Testosterone       Lindwood Qua, MA

## 2023-11-03 LAB — TESTOSTERONE: Testosterone: <3 ng/dL — ABNORMAL LOW (ref 348–1197)

## 2023-11-03 LAB — PROSTATE SPECIFIC ANTIGEN, TOTAL: PSA: <0.03 ng/mL (ref 0–4)

## 2023-11-07 MED ORDER — MEGESTROL ACETATE 20 MG PO TABS
20 | ORAL_TABLET | Freq: Every day | ORAL | 0 refills | 5.00 days | Status: DC
Start: 2023-11-07 — End: 2024-02-05

## 2023-11-07 MED ORDER — MIRABEGRON ER 50 MG PO TB24
50 | ORAL_TABLET | Freq: Every day | ORAL | 0 refills | 90.00 days | Status: DC
Start: 2023-11-07 — End: 2024-02-05

## 2023-11-09 NOTE — Other (Signed)
 No additional comment

## 2023-11-14 LAB — VITAMIN D, 25 HYDROXY: Vit D, 25-Hydroxy: 34 ng/mL (ref 30.0–100.0)

## 2023-11-14 NOTE — Other (Signed)
 Vit D wnl     Filiberto Pinks, PA-C

## 2024-01-24 ENCOUNTER — Other Ambulatory Visit: Admit: 2024-01-24 | Discharge: 2024-01-24 | Payer: MEDICARE

## 2024-01-24 DIAGNOSIS — C772 Secondary and unspecified malignant neoplasm of intra-abdominal lymph nodes: Secondary | ICD-10-CM

## 2024-01-24 LAB — TESTOSTERONE: Testosterone: <3 ng/dL — ABNORMAL LOW (ref 348–1197)

## 2024-01-24 LAB — PSA,  (DIAGNOSTIC UVA): PSA: <0.03 ng/mL (ref 0–4)

## 2024-01-24 NOTE — Other (Signed)
 Noted. Will review results with the patient at scheduled follow up. Note shared to the patient's MyChart.     Filiberto Pinks, PA-C

## 2024-01-24 NOTE — Progress Notes (Signed)
 Colton Cox presents today for lab draw per Dr. Luana Rumple order.   Dr. Brendia Callander was present in the clinic as incident to.     PSA and TST obtained via venipuncture without any difficulty.    Patient will be notified with lab results.       Orders Placed This Encounter   Procedures    COLLECTION VENOUS BLOOD,VENIPUNCTURE    Testosterone    PSA,  (Diagnostic [facility])       Colton Client, MA

## 2024-01-31 ENCOUNTER — Ambulatory Visit
Admit: 2024-01-31 | Discharge: 2024-01-31 | Payer: MEDICARE | Attending: Student in an Organized Health Care Education/Training Program

## 2024-01-31 DIAGNOSIS — C772 Secondary and unspecified malignant neoplasm of intra-abdominal lymph nodes: Secondary | ICD-10-CM

## 2024-01-31 LAB — AMB POC URINALYSIS DIP STICK AUTO W/O MICRO
Bilirubin, Urine, POC: NEGATIVE
Glucose, Urine, POC: NEGATIVE
Ketones, Urine, POC: NEGATIVE
Leukocyte Esterase, Urine, POC: NEGATIVE
Nitrite, Urine, POC: NEGATIVE
Protein, Urine, POC: NEGATIVE
Specific Gravity, Urine, POC: 1.015 (ref 1.001–1.035)
Urobilinogen, POC: 0.2
pH, Urine, POC: 6 (ref 4.6–8.0)

## 2024-01-31 NOTE — Progress Notes (Signed)
 Colton Cox  DOB [birth year]  78 y.o.    01/31/2024    ASSESSMENT:    ICD-10-CM    1. 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 OAB (overactive bladder)  N32.81       3. BPH with obstruction/lower urinary tract symptoms  N40.1     N13.8       4. Hot flashes  R23.2         - [id number] hormone-sensitive prostate cancer  cT1cNxM0 GS 4+3, Prostate cancer in 9/12 cores, prostate volume of  49.57 cm3, Pre-biopsy PSA= 3.75ng/mL.                Bone scan 12/22/15: negative for metastatic prostate cancer.   CTU 2/17: negative              ADT initiated on 02/06/16 with Eligard  45 mg injection. Single dose.              Completed XRT 03/18/16-04/23/16 with Dr. Byron Castellani               PSA nadir: 0.11 ng/ml (06/2018) with PSA back to baseline              DRE 03/21/2019: No evidence recurrence              PSMA PET 05/22/21: PyL binding in left internal iliac and right retrocrural lymph nodes compatible with metastatic prostate malignancy.   PSA 2.29 ng/ml, T 203 ng/dl on 05/19/55              Restarted ADT on 06/17/21 with Degeralix 240 mg inj. Transition to Eligard  22.5 mg with final planned injection on 03/29/23   Started Xtandi  07/2021 at 50% dosage 2/2 plavix usage. Completed 07/2023.              DEXA scan 09/24/20: Major osteoporotic compression fracture risk: 7.3%, Hip fracture risk: >3%   Completed salvage IMRT to the pelvic LN with Dr. Ellan Gunner on 08/20/21 and SBRT to PSMA positive right retrocrural LN 10/15/21  PSMA denied by Medicare due to not meeting Medicare guidelines of rising PSA   Bone scan 07/27/23: No definite scintigraphic evidence of osseous metastatic disease. Increased radiotracer activity at the right anterior ninth and tenth ribs, which likely corresponds to healing rib fractures. Moderately increased activity is noted at the L1 vertebral level from compression fracture as seen on CT. Additional sites of likely degenerative  changes/arthritis.  CT a/p 07/27/23: NED. New L1 compression fracture -- occurred after fall, had eval by [facility]   Current Disease Status: Stable. Most recent PSA was <0.03 ng/mL on 01/24/24 (T <3 ng/dl)    - Hot flashes 2/2 ADT. Utilizing Megace .     - BPH s/p ProTouch laser enucleation of the prostate with tissue morcellation on 01/14/16. On Myrbetriq  50mg  daily with nocturia x2-3  PVR 10 cc on 07/05/23                 - History of microscopic hematuria, no gross hematuria. Risk factor: former smoker  Urine cytology 10/2015 showed rare atypical urothelial cells.   CTU 11/21/15 showed slightly attenuated and perhaps slightly thickened mid-distal right ureter/equivocally strictured, but without proximal dilatation.   Retrograde ureterography in OR 01/14/16 w/o evidence of filling defect           - History of Vit D  deficiency    DEXA scan (09/24/21): Major osteoporotic compression fracture risk: 7.3%, Hip fracture risk: 2.4%. No Prolia indicated.     Most recent Vit D wnl (10/2023)     - History of TIA, on Plavix 75 mg and ASA 81 mg daily     PLAN:  Reviewed 01/24/24 labs with patient: PSA <0.03 ng/ml, T <3 ng/dl   Continue close surveillance of PSA trend at this time as disease remains NED   Discussed indications for PSMA PET/CT for rising PSA trend off therapy   Continue Vit D/Calc supplementation while testosterone remains castrate   Continue Myrbetriq  50 mg daily  Continue Megace  20mg  PO daily as needed for hot flashes   Tech PSA/T/Vit D in 3 months   Follow up in 6 months with PSA/T prior; sooner if problems     I am following the established plan for Dr. Loetta Ringer and Dr. Loetta Ringer is the supervising physician for this day.    Patient is receiving long term care for aforementioned conditions as outlined above.       Chief Complaint   Patient presents with    Prostate Cancer     HPI: Colton Cox is a pleasant 78 y.o. caucasian male who presents today in follow up for metastatic prostate cancer and BPH/LUTS. Initially  diagnosed cT1cNxM0 GS 4+3 (Dx 12/12/15), Prostate cancer in 9/12 cores, prostate volume of  49.57 cm3, Pre-biopsy PSA= 3.75ng/mL s/p ADT and XRT completed 80/2017 and BPH s/p ProLEP 12/2015.   Had BCR. PSMA PET 05/22/21 demonstrated PyL binding in left internal iliac and right retrocrural lymph nodes compatible with metastatic prostate malignancy. Restarted ADT on 06/17/21. Started Xtandi  07/2021 at 50% dosage 2/2 plavix usage. Completed salvage IMRT to the pelvic LN with Dr. Ellan Gunner on 08/20/21 and SBRT to PSMA positive right retrocrural LN 10/15/21    Patient presents today doing well. He continues taking Megace  for hot flashes with benefit. Endorses loss in muscle mass since ADT/Xtandi . Stable weight. Now has chronic back pain following fall last year and resultant compression fracture to L1. He is followed by [facility]. No new or worsening bone or back pain.     He remains on Myrbetriq  50mg  daily. Stable LUTS, specifically nocturia x2-3. He is unsure if he snores at night. No dx of OSA. Fair to weak stream, which is also longstanding. No gross hematuria, dysuria, straining. He is content with current voiding status.     Following with [facility] for anemia.      Denies any episodes of seizures.     Past Medical History:   Diagnosis Date    Cerebral artery occlusion with cerebral infarction [facility])     Colon polyps     Hypertension     Personal history of prostate cancer        Past Surgical History:   Procedure Laterality Date    COLONOSCOPY  01/2018    HERNIA REPAIR      OTHER SURGICAL HISTORY  06/2016    Cat Scan: spot noted on lung    PR APPENDECTOMY      UROLOGICAL SURGERY N/A 01/14/2016    Cysto, Right RPG, and Protouch laser enucleation of the prostate with tissue morcellation - Dr. Loetta Ringer       Current Outpatient Medications   Medication Sig Dispense Refill    meloxicam (MOBIC) 15 MG tablet       megestrol  (MEGACE ) 20 MG tablet Take 1 tablet by mouth daily 90 tablet 0  mirabegron  (MYRBETRIQ ) 50 MG TB24 Take 50 mg  by mouth daily 90 tablet 0    vitamin D  (ERGOCALCIFEROL ) 1.25 MG (50000 UT) CAPS capsule Take 1 capsule by mouth once a week 12 capsule 1    Ferrous Sulfate Dried (HIGH POTENCY IRON) 65 MG TABS Take by mouth      Ascorbic Acid (VITAMIN C) 1000 MG tablet Take 1 tablet by mouth daily      Cyanocobalamin (VITAMIN B-12) 2500 MCG SUBL Place 1 tablet under the tongue daily      calcium carbonate 600 MG TABS tablet Take 1 tablet by mouth daily      clopidogrel (PLAVIX) 75 MG tablet Take by mouth      hydroCHLOROthiazide (MICROZIDE) 12.5 MG capsule ceived the following from [facility]: Outside name: hydroCHLOROthiazide (MICROZIDE) 12.5 mg capsule      losartan (COZAAR) 50 MG tablet ceived the following from [facility]: Outside name: losartan (COZAAR) 50 mg tablet      simvastatin (ZOCOR) 20 MG tablet ceived the following from [facility]: Outside name: simvastatin (ZOCOR) 20 mg tablet       No current facility-administered medications for this visit.       Allergies   Allergen Reactions    Bee Venom Shortness Of Breath       Family History   Problem Relation Age of Onset    Colon Cancer Father     Colon Cancer Mother        Social History     Socioeconomic History    Marital status: Married     Spouse name: None    Number of children: None    Years of education: None    Highest education level: None   Tobacco Use    Smoking status: Former    Smokeless tobacco: Former     Quit date: 09/20/1988   Substance and Sexual Activity    Alcohol use: Yes    Drug use: No       PHYSICAL EXAM:  There were no vitals filed for this visit.    GEN: NAD, alert and oriented  PULM: nl resp effort, no distress  PSYCH: Nl mood and affect    LABS:  Results for orders placed or performed in visit on 01/31/24   AMB POC URINALYSIS DIP STICK AUTO W/O MICRO   Result Value Ref Range    Color, Urine, POC Yellow     Clarity, Urine, POC Clear     Glucose, Urine, POC Negative     Bilirubin, Urine, POC Negative      Ketones, Urine, POC Negative     Specific Gravity, Urine, POC 1.015 1.001 - 1.035    Blood, Urine, POC Small Negative    pH, Urine, POC 6.0 4.6 - 8.0    Protein, Urine, POC Negative     Urobilinogen, POC 0.2 mg/dL     Nitrite, Urine, POC Negative     Leukocyte Esterase, Urine, POC Negative       [facility]     PSA Testosterone   Latest Ref Rng 0 - 4 ng/mL 348 - 1197 ng/dL   1/61/0960 4.54  098 (L)    07/20/2018 0.11  215 (L)    09/26/2018 0.25  187 (L)    12/27/2018 0.42     03/21/2019 0.66  208 (L)    06/21/2019 1.06     09/19/2019 1.39  270 (L)    12/20/2019  0.99     03/13/2020 1.16     09/26/2020 1.49     04/13/2021 2.16  187 (L)    06/17/2021 2.29  203 (L)    10/05/2021 <0.03  <3 (L)    01/20/2022 <0.03  <3 (L)    04/02/2022 <0.03  <3 (L)    06/28/2022 <0.1  <3 (L)    10/01/2022 <0.03  <3 (L)    12/27/2022 <0.03  <3 (L)    03/29/2023 <0.03  <3 (L)    06/27/2023 <0.03  <3 (L)    11/02/2023 <0.03  <3 (L)    01/24/2024 <0.03  <3 (L)       Surgical Pathology 01/14/2016  FINAL DIAGNOSIS:   PROSTATE, TRANSURETHRAL RESECTION:   BENIGN NODULAR HYPERPLASIA     Prostate Biopsy Pathology:        result Gleason Grade cores   12/12/2015 4+3= 7 POS:9 Total:12      Biopsy Pathology 12/12/2015  DIAGNOSIS:   A. Right Apex Needle biopsy                  Benign prostatic tissue.   B. Right Mid Needle biopsy                  Benign prostatic tissue.   C. Right Base Needle biopsy                  ADENOCARCINOMA, GLEASON SCORE 3 + 3 = 6 involving 5 % of the specimen (1 of 1 core(s) positive).                Grade Group 1. Ends not involved by the tumor. Perineural invasion.   D. Right Lateral Apex Needle biopsy                  Benign prostatic tissue.   E. Right Lateral Mid Needle biopsy                  ADENOCARCINOMA, GLEASON SCORE 3 + 3 = 6 involving 10 % of the specimen (1 of 1 core(s) positive).                Grade Group 1. Ends not involved by the tumor.   F. Right Lateral Base Needle biopsy                  ADENOCARCINOMA, GLEASON SCORE 3 + 3 = 6  involving 5 % of the specimen (1 of 1 core(s) positive).                Grade Group 1. Ends not involved by the tumor.   G. Left Apex Needle biopsy                  ADENOCARCINOMA, GLEASON SCORE 4 + 3 = 7 involving 20 % of the specimen, discontinuous (1 of 1 core(s) positive).                Gleason 4 comprises 70 % of the cancer. Grade Group 3. Ends not involved by the tumor.   H. Left Mid Needle biopsy                  ADENOCARCINOMA, GLEASON SCORE 3 + 4 = 7 involving 60 % of the specimen, discontinuous (1 of 1 core(s) positive).                Gleason 4 comprises 20 % of the cancer. Grade Group  2. Tumor involves one end.   I. Left Base Needle biopsy                  ADENOCARCINOMA, GLEASON SCORE 4 + 3 = 7 involving 80 % of the specimen, discontinuous (1 of 1 core(s) positive).                Gleason 4 comprises 70 % of the cancer. Grade Group 3. Tumor involves one end. Perineural invasion.   J. Left Lateral Apex Needle biopsy                  ADENOCARCINOMA, GLEASON SCORE 3 + 4 = 7 involving 20 % of the specimen, discontinuous (1 of 1 core(s) positive).                Gleason 4 comprises 40 % of the cancer. Grade Group 2. Ends not involved by the tumor.   K. Left Lateral Mid Needle biopsy                  ADENOCARCINOMA, GLEASON SCORE 4 + 3 = 7 involving 60 % of the specimen (3 of 3 core(s) positive).                Gleason 4 comprises 70 % of the cancer. Grade Group 3. Tumor involves one end. Perineural invasion.   L. Left Lateral Base Needle biopsy                  ADENOCARCINOMA, GLEASON SCORE 4 + 3 = 7 involving 95 % of the specimen (1 of 1 core(s) positive).                Gleason 4 comprises 70 % of the cancer. Grade Group 3. Ends not involved by the tumor. Perineural invasion.     RADIOLOGY:  CT A/P 07/27/23:  FINDINGS:     ABDOMEN FINDINGS:     Liver: No focal mass.   Spleen: No splenomegaly.   Pancreas: No focal mass. No significant ductal dilatation.   Biliary: No biliary ductal dilatation.  Cholelithiasis.   Bowel: Diverticulosis.. .   Peritoneum/ Retroperitoneum: No free fluid or free air.   Lymph Nodes: No lymphadenopathy.     Adrenal Glands: No focal nodule.   Kidneys: There are several right renal cysts measuring 2.6 cm right upper, 1.9 cm right interpolar and 2.6 cm right peripelvic. There is a thin internal septation in the right upper pole cyst. A few too small to characterize lesions. No follow-up necessary for these lesions.. Mild dependent calcification in a right upper pole lesion.. No hydronephrosis.     Vessels: Moderate atherosclerosis. Coarse calcification right renal ostia.Aaron Aas     PELVIS FINDINGS:     Bladder/ Pelvic Organs:  Unremarkable.     Lung Base: Coronary artery calcifications..     Bones: Likely injection granulomas anterior pelvic wall. Left greater than right fat-containing inguinal hernias. Degenerative changes.. Compression fracture L1 is new/has progressed. There is gas in the fracture and at the superior endplate disc space. Approximate 50% anterior height loss. Minimal retropulsion of bone into the spinal canal. Chronic anterior height loss at T12, L2. Schmorl's nodes.     IMPRESSION   1. New/progressed L1 compression fracture with minimal retropulsion of bone into the spinal canal. Other chronic regions of anterior height loss.   2. Moderate atherosclerosis with coarse calcification and stenosis right renal ostia.   3. Cholelithiasis.   4. Diverticulosis.   5. Bosniak  1 and 2 renal cysts for which no follow-up is necessary.   6. Left greater than right fat-containing inguinal hernias.     Bone scan 07/27/23:  FINDINGS:      Focal, increased radiotracer activity at contiguous, lower anterior right rib cage at the ninth and tenth ribs, which appear to correspond to healing rib fractures on CT.      Moderately increased radiotracer activity is also noted in the lower thoracic/upper lumbar spine. On corresponding CT, this activity likely attributable to L1 compression fracture  and degenerative changes.      Milder focal activity at the left 12th costovertebral margin, with fracture callous on CT.      There are sites of likely arthritic/degenerative changes at the joints of the extremities, most notably at the bilateral shoulders and left greater than right ankle. Small foci of trace radiotracer activity at the bilateral knees also likely demonstrate degenerative changes as seen on prior knee radiographs.      Increased activity at approximately the location of the bilateral first MTP joints, with right sided predominance, also likely represents degenerative changes. '      There is physiologic soft tissue radiotracer activity in the kidneys and urinary bladder.      IMPRESSION:      1.No definite scintigraphic evidence of osseous metastatic disease.   2.         There is increased radiotracer activity at the right anterior ninth and tenth ribs, which likely corresponds to healing rib fractures.   3.Moderately increased activity is noted at the L1 vertebral level from compression fracture as seen on CT.   4.Additional sites of likely degenerative changes/arthritis are noted and described above.       PSMA PET Scan 05/22/21  IMPRESSION   1. PyL binding in left internal iliac and right retrocrural lymph nodes compatible with metastatic prostate malignancy.    NM Bone Scan [facility] 12/22/2015  IMPRESSION:  No bone scan evidence of osseous metastatic disease.         Milda Aline, PA-C  [facility]   [address]  [place], [place] [address]  [phone] Main Number  [phone] Fax     [id number], Ivette Marks, FNP

## 2024-02-07 MED ORDER — MEGESTROL ACETATE 20 MG PO TABS
20 | ORAL_TABLET | Freq: Every day | ORAL | 0 refills | 30.00000 days | Status: DC
Start: 2024-02-07 — End: 2024-05-14

## 2024-02-07 MED ORDER — MIRABEGRON ER 50 MG PO TB24
50 | ORAL_TABLET | Freq: Every day | ORAL | 0 refills | 90.00000 days | Status: DC
Start: 2024-02-07 — End: 2024-05-14

## 2024-03-19 MED ORDER — VITAMIN D (ERGOCALCIFEROL) 1.25 MG (50000 UT) PO CAPS
1.25 | ORAL_CAPSULE | ORAL | 1 refills | 80.50000 days | Status: AC
Start: 2024-03-19 — End: ?

## 2024-05-14 ENCOUNTER — Encounter

## 2024-05-14 ENCOUNTER — Other Ambulatory Visit: Admit: 2024-05-14 | Discharge: 2024-05-14 | Payer: MEDICARE

## 2024-05-14 DIAGNOSIS — C61 Malignant neoplasm of prostate: Principal | ICD-10-CM

## 2024-05-14 NOTE — Progress Notes (Signed)
 Colton Cox presents today for lab draw per [place], [place] order.   Dr. Gwendlyn was present in the clinic as incident to.     PSA, TST, and Vitamin D  obtained via venipuncture without any difficulty.    Patient will be notified with lab results.       Orders Placed This Encounter   Procedures    COLLECTION VENOUS BLOOD,VENIPUNCTURE    PSA,  (Diagnostic [facility])    Testosterone    Vitamin D , 25 Hydroxy       Waddell Na

## 2024-05-15 LAB — TESTOSTERONE: Testosterone: 4 ng/dL — ABNORMAL LOW (ref 348–1197)

## 2024-05-15 LAB — VITAMIN D, 25 HYDROXY: Vit D, 25-Hydroxy: 69.6 ng/mL (ref 30.0–100.0)

## 2024-05-15 LAB — PSA,  (DIAGNOSTIC UVA): PSA: <0.03 ng/mL (ref 0–4)

## 2024-05-15 MED ORDER — MEGESTROL ACETATE 20 MG PO TABS
20 | ORAL_TABLET | Freq: Every day | ORAL | 0 refills | 24.00000 days | Status: DC
Start: 2024-05-15 — End: 2024-08-12

## 2024-05-15 MED ORDER — MIRABEGRON ER 50 MG PO TB24
50 | ORAL_TABLET | Freq: Every day | ORAL | 0 refills | 30.00000 days | Status: DC
Start: 2024-05-15 — End: 2024-08-12

## 2024-06-06 NOTE — Other (Signed)
 No additional comment

## 2024-08-12 ENCOUNTER — Encounter

## 2024-08-13 MED ORDER — MIRABEGRON ER 50 MG PO TB24
50 | ORAL_TABLET | Freq: Every day | ORAL | 2 refills | 30.00000 days | Status: AC
Start: 2024-08-13 — End: ?

## 2024-08-13 MED ORDER — MEGESTROL ACETATE 20 MG PO TABS
20 | ORAL_TABLET | Freq: Every day | ORAL | 2 refills | 24.00000 days | Status: AC
Start: 2024-08-13 — End: ?

## 2024-08-14 ENCOUNTER — Other Ambulatory Visit: Admit: 2024-08-14 | Discharge: 2024-08-14 | Payer: MEDICARE

## 2024-08-14 DIAGNOSIS — C61 Malignant neoplasm of prostate: Principal | ICD-10-CM

## 2024-08-14 LAB — PSA,  (DIAGNOSTIC UVA): PSA: <0.03 ng/mL (ref 0–4)

## 2024-08-14 LAB — TESTOSTERONE: Testosterone: 8 ng/dL — ABNORMAL LOW (ref 348–1197)

## 2024-08-14 NOTE — Progress Notes (Signed)
"  Colton Cox presents today for lab draw per [place], [place] order.   Dr. Leopoldo was present in the clinic as incident to.     PSA and TST obtained via venipuncture without any difficulty.    Patient will be notified with lab results.       Orders Placed This Encounter   Procedures    COLLECTION VENOUS BLOOD,VENIPUNCTURE    PSA,  (Diagnostic [facility])    Testosterone       Waddell Na     "

## 2024-08-20 NOTE — Result Encounter Note (Signed)
"  Noted. Will review results with the patient at scheduled follow up. Note shared to the patient's MyChart.     Barnie LITTIE Chillington, PA-C    "

## 2024-08-23 ENCOUNTER — Ambulatory Visit
Admit: 2024-08-23 | Discharge: 2024-08-23 | Payer: MEDICARE | Attending: Student in an Organized Health Care Education/Training Program

## 2024-08-23 DIAGNOSIS — Z8546 Personal history of malignant neoplasm of prostate: Principal | ICD-10-CM

## 2024-08-23 LAB — AMB POC PVR, MEAS,POST-VOID RES,US,NON-IMAGING: PVR, POC: 1 cc

## 2024-08-23 NOTE — Progress Notes (Signed)
 "  Colton Cox  DOB [birth year]  78 y.o.    08/23/2024    ASSESSMENT:    ICD-10-CM    1. History of prostate cancer  Z85.46       2. 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 OAB (overactive bladder)  N32.81 AMB POC PVR, MEAS,POST-VOID RES,US ,NON-IMAGING      4. Hot flashes  R23.2         - [id number] hormone-sensitive prostate cancer  cT1cNxM0 GS 4+3, Prostate cancer in 9/12 cores, prostate volume of  49.57 cm3, Pre-biopsy PSA= 3.75ng/mL.                Bone scan 12/22/15: negative for metastatic prostate cancer.   CTU 2/17: negative              ADT initiated on 02/06/16 with Eligard  45 mg injection. Single dose.              Completed XRT 03/18/16-04/23/16 with Dr. Graylon Pinal               PSA nadir: 0.11 ng/ml (06/2018) with PSA back to baseline              DRE 03/21/2019: No evidence recurrence              PSMA PET 05/22/21: PyL binding in left internal iliac and right retrocrural lymph nodes compatible with metastatic prostate malignancy.   PSA 2.29 ng/ml, T 203 ng/dl on 0/71/77              Restarted ADT on 06/17/21 with Degeralix 240 mg inj. Transition to Eligard  22.5 mg with final planned injection on 03/29/23   Started Xtandi  07/2021 at 50% dosage 2/2 plavix usage. Completed 07/2023.              DEXA scan 09/24/20: Major osteoporotic compression fracture risk: 7.3%, Hip fracture risk: >3%   Completed salvage IMRT to the pelvic LN with Dr. Heywood on 08/20/21 and SBRT to PSMA positive right retrocrural LN 10/15/21  PSMA denied by Medicare due to not meeting Medicare guidelines of rising PSA   Bone scan 07/27/23: No definite scintigraphic evidence of osseous metastatic disease. Increased radiotracer activity at the right anterior ninth and tenth ribs, which likely corresponds to healing rib fractures. Moderately increased activity is noted at the L1 vertebral level from compression fracture as seen on CT. Additional sites of likely  degenerative changes/arthritis.  CT a/p 07/27/23: NED. New L1 compression fracture -- occurred after fall, had eval by [facility]   Current Disease Status: Stable. Most recent PSA was <0.03 ng/mL on 08/14/24 (T 8 ng/dl)    - Hot flashes 2/2 ADT. Utilizing Megace .     - BPH s/p Prolep on 01/14/16. On Myrbetriq  50mg  daily with nocturia x2-3  PVR 1 cc on 08/23/24                 - History of microscopic hematuria, no gross hematuria. Risk factor: former smoker  Urine cytology 10/2015 showed rare atypical urothelial cells.   CTU 11/21/15 showed slightly attenuated and perhaps slightly thickened mid-distal right ureter/equivocally strictured, but without proximal dilatation.   Retrograde ureterography in OR 01/14/16 w/o evidence of filling defect           - History of Vit D deficiency    DEXA scan (09/24/21): Major osteoporotic compression fracture risk: 7.3%, Hip fracture risk:  2.4%. No Prolia indicated.     Most recent Vit D wnl (04/2024)     - History of TIA, on Plavix 75 mg and ASA 81 mg daily     PLAN:  Reviewed 08/14/24 labs with patient: PSA <0.03 ng/ml, T 8 ng/dl   PVR today 1cc, unable to provide urine sample today for UA   Continue close surveillance of PSA trend at this time as disease remains NED   Discussed indications for PSMA PET/CT for rising PSA trend off therapy   Continue Vit D/Calc supplementation while testosterone remains castrate   Continue Myrbetriq  50 mg daily  Continue Megace  20mg  PO daily as needed for hot flashes   Tech PSA/T/Vit D in 3 months   Follow up in 6 months with PSA/T prior; sooner if problems     I am following the established plan for Dr. Burnard and Dr. Kreg is the supervising physician for this day.    Patient is receiving long term care for aforementioned conditions as outlined above.       Chief Complaint   Patient presents with    Prostate Cancer     HPI: Colton Cox is a pleasant 77 y.o. caucasian male who presents today in follow up for metastatic prostate cancer and BPH/LUTS.  Initially diagnosed cT1cNxM0 GS 4+3 (Dx 12/12/15), Prostate cancer in 9/12 cores, prostate volume of  49.57 cm3, Pre-biopsy PSA= 3.75ng/mL s/p ADT and XRT completed 04/2016 and BPH s/p ProLEP 12/2015.     Had BCR. PSMA PET 05/22/21 demonstrated PyL binding in left internal iliac and right retrocrural lymph nodes compatible with metastatic prostate malignancy. Restarted ADT on 06/17/21. Started Xtandi  07/2021 at 50% dosage 2/2 plavix usage. Completed salvage IMRT to the pelvic LN with Dr. Heywood on 08/20/21 and SBRT to PSMA positive right retrocrural LN 10/15/21.    Patient presents today doing well. He continues taking Megace  for hot flashes with benefit. Endorses loss in muscle mass since ADT/Xtandi . Minor weight gain recently. Reports chronic back pain following fall last year and resultant compression fracture to L1 as well as rib fractures but notes this has been improving significantly. No new or worsening bone or back pain.    He remains on Myrbetriq  50mg  daily. Stable LUTS, specifically nocturia x2-3, not bothersome currently. No dx of OSA. FOS fair, no gross hematuria, dysuria, straining.    Following with [facility] for anemia, appointment next week.      Denies any episodes of seizures.     Past Medical History:   Diagnosis Date    Cerebral artery occlusion with cerebral infarction [facility])     Colon polyps     Hypertension     Personal history of prostate cancer        Past Surgical History:   Procedure Laterality Date    COLONOSCOPY  01/2018    HERNIA REPAIR      OTHER SURGICAL HISTORY  06/2016    Cat Scan: spot noted on lung    PR APPENDECTOMY      UROLOGICAL SURGERY N/A 01/14/2016    Cysto, Right RPG, and Protouch laser enucleation of the prostate with tissue morcellation - Dr. Burnard       Current Outpatient Medications   Medication Sig Dispense Refill    megestrol  (MEGACE ) 20 MG tablet Take 1 tablet by mouth daily 90 tablet 2    mirabegron  (MYRBETRIQ ) 50 MG TB24 Take 50 mg by mouth daily 90 tablet 2    vitamin D   (ERGOCALCIFEROL )  1.25 MG (50000 UT) CAPS capsule Take 1 capsule by mouth once a week 12 capsule 1    meloxicam (MOBIC) 15 MG tablet       Ferrous Sulfate Dried (HIGH POTENCY IRON) 65 MG TABS Take by mouth      Ascorbic Acid (VITAMIN C) 1000 MG tablet Take 1 tablet by mouth daily      Cyanocobalamin (VITAMIN B-12) 2500 MCG SUBL Place 1 tablet under the tongue daily      calcium carbonate 600 MG TABS tablet Take 1 tablet by mouth daily      clopidogrel (PLAVIX) 75 MG tablet Take by mouth      hydroCHLOROthiazide (MICROZIDE) 12.5 MG capsule ceived the following from [facility]: Outside name: hydroCHLOROthiazide (MICROZIDE) 12.5 mg capsule      losartan (COZAAR) 50 MG tablet ceived the following from [facility]: Outside name: losartan (COZAAR) 50 mg tablet      simvastatin (ZOCOR) 20 MG tablet ceived the following from [facility]: Outside name: simvastatin (ZOCOR) 20 mg tablet       No current facility-administered medications for this visit.       Allergies   Allergen Reactions    Bee Venom Shortness Of Breath       Family History   Problem Relation Age of Onset    Colon Cancer Father     Colon Cancer Mother        Social History     Socioeconomic History    Marital status: Married     Spouse name: None    Number of children: None    Years of education: None    Highest education level: None   Tobacco Use    Smoking status: Former    Smokeless tobacco: Former     Quit date: 09/20/1988   Substance and Sexual Activity    Alcohol use: Yes    Drug use: No       PHYSICAL EXAM:  There were no vitals filed for this visit.    GEN: NAD, alert and oriented  PULM: nl resp effort, no distress  PSYCH: Nl mood and affect    LABS:  Results for orders placed or performed in visit on 08/23/24   AMB POC PVR, MEAS,POST-VOID RES,US ,NON-IMAGING   Result Value Ref Range    PVR, POC 1 cc        [facility]     PSA Testosterone   Latest Ref Rng 0 - 4 ng/mL 348 - 1,197 ng/dL   3/74/7980 9.88  815  (L)    07/20/2018 0.11  215 (L)    09/26/2018 0.25  187 (L)    12/27/2018 0.42     03/21/2019 0.66  208 (L)    06/21/2019 1.06     09/19/2019 1.39  270 (L)    12/20/2019 0.99     03/13/2020 1.16     09/26/2020 1.49     04/13/2021 2.16  187 (L)    06/17/2021 2.29  203 (L)    10/05/2021 <0.03  <3 (L)    01/20/2022 <0.03  <3 (L)    04/02/2022 <0.03  <3 (L)    06/28/2022 <0.1  <3 (L)    10/01/2022 <0.03  <3 (L)    12/27/2022 <0.03  <3 (L)    03/29/2023 <0.03  <3 (L)    06/27/2023 <0.03  <3 (L)    11/02/2023 <0.03  <3 (L)    01/24/2024 <  0.03  <3 (L)    05/14/2024 <0.03  4 (L)    08/14/2024 <0.03  8 (L)        Surgical Pathology 01/14/2016  FINAL DIAGNOSIS:   PROSTATE, TRANSURETHRAL RESECTION:   BENIGN NODULAR HYPERPLASIA     Prostate Biopsy Pathology:        result Gleason Grade cores   12/12/2015 4+3= 7 POS:9 Total:12      Biopsy Pathology 12/12/2015  DIAGNOSIS:   A. Right Apex Needle biopsy                  Benign prostatic tissue.   B. Right Mid Needle biopsy                  Benign prostatic tissue.   C. Right Base Needle biopsy                  ADENOCARCINOMA, GLEASON SCORE 3 + 3 = 6 involving 5 % of the specimen (1 of 1 core(s) positive).                Grade Group 1. Ends not involved by the tumor. Perineural invasion.   D. Right Lateral Apex Needle biopsy                  Benign prostatic tissue.   E. Right Lateral Mid Needle biopsy                  ADENOCARCINOMA, GLEASON SCORE 3 + 3 = 6 involving 10 % of the specimen (1 of 1 core(s) positive).                Grade Group 1. Ends not involved by the tumor.   F. Right Lateral Base Needle biopsy                  ADENOCARCINOMA, GLEASON SCORE 3 + 3 = 6 involving 5 % of the specimen (1 of 1 core(s) positive).                Grade Group 1. Ends not involved by the tumor.   G. Left Apex Needle biopsy                  ADENOCARCINOMA, GLEASON SCORE 4 + 3 = 7 involving 20 % of the specimen, discontinuous (1 of 1 core(s) positive).                Gleason 4 comprises 70 % of the cancer. Grade Group 3.  Ends not involved by the tumor.   H. Left Mid Needle biopsy                  ADENOCARCINOMA, GLEASON SCORE 3 + 4 = 7 involving 60 % of the specimen, discontinuous (1 of 1 core(s) positive).                Gleason 4 comprises 20 % of the cancer. Grade Group 2. Tumor involves one end.   I. Left Base Needle biopsy                  ADENOCARCINOMA, GLEASON SCORE 4 + 3 = 7 involving 80 % of the specimen, discontinuous (1 of 1 core(s) positive).                Gleason 4 comprises 70 % of the cancer. Grade Group 3. Tumor involves one end. Perineural invasion.   J. Left Lateral Apex Needle biopsy  ADENOCARCINOMA, GLEASON SCORE 3 + 4 = 7 involving 20 % of the specimen, discontinuous (1 of 1 core(s) positive).                Gleason 4 comprises 40 % of the cancer. Grade Group 2. Ends not involved by the tumor.   K. Left Lateral Mid Needle biopsy                  ADENOCARCINOMA, GLEASON SCORE 4 + 3 = 7 involving 60 % of the specimen (3 of 3 core(s) positive).                Gleason 4 comprises 70 % of the cancer. Grade Group 3. Tumor involves one end. Perineural invasion.   L. Left Lateral Base Needle biopsy                  ADENOCARCINOMA, GLEASON SCORE 4 + 3 = 7 involving 95 % of the specimen (1 of 1 core(s) positive).                Gleason 4 comprises 70 % of the cancer. Grade Group 3. Ends not involved by the tumor. Perineural invasion.     RADIOLOGY:  CT A/P 07/27/23:  FINDINGS:     ABDOMEN FINDINGS:     Liver: No focal mass.   Spleen: No splenomegaly.   Pancreas: No focal mass. No significant ductal dilatation.   Biliary: No biliary ductal dilatation. Cholelithiasis.   Bowel: Diverticulosis.. .   Peritoneum/ Retroperitoneum: No free fluid or free air.   Lymph Nodes: No lymphadenopathy.     Adrenal Glands: No focal nodule.   Kidneys: There are several right renal cysts measuring 2.6 cm right upper, 1.9 cm right interpolar and 2.6 cm right peripelvic. There is a thin internal septation in the right upper  pole cyst. A few too small to characterize lesions. No follow-up necessary for these lesions.. Mild dependent calcification in a right upper pole lesion.. No hydronephrosis.     Vessels: Moderate atherosclerosis. Coarse calcification right renal ostia.SABRA     PELVIS FINDINGS:     Bladder/ Pelvic Organs:  Unremarkable.     Lung Base: Coronary artery calcifications..     Bones: Likely injection granulomas anterior pelvic wall. Left greater than right fat-containing inguinal hernias. Degenerative changes.. Compression fracture L1 is new/has progressed. There is gas in the fracture and at the superior endplate disc space. Approximate 50% anterior height loss. Minimal retropulsion of bone into the spinal canal. Chronic anterior height loss at T12, L2. Schmorl's nodes.     IMPRESSION   1. New/progressed L1 compression fracture with minimal retropulsion of bone into the spinal canal. Other chronic regions of anterior height loss.   2. Moderate atherosclerosis with coarse calcification and stenosis right renal ostia.   3. Cholelithiasis.   4. Diverticulosis.   5. Bosniak 1 and 2 renal cysts for which no follow-up is necessary.   6. Left greater than right fat-containing inguinal hernias.     Bone scan 07/27/23:  FINDINGS:      Focal, increased radiotracer activity at contiguous, lower anterior right rib cage at the ninth and tenth ribs, which appear to correspond to healing rib fractures on CT.      Moderately increased radiotracer activity is also noted in the lower thoracic/upper lumbar spine. On corresponding CT, this activity likely attributable to L1 compression fracture and degenerative changes.      Milder focal activity at the left 12th  costovertebral margin, with fracture callous on CT.      There are sites of likely arthritic/degenerative changes at the joints of the extremities, most notably at the bilateral shoulders and left greater than right ankle. Small foci of trace radiotracer activity at the bilateral knees  also likely demonstrate degenerative changes as seen on prior knee radiographs.      Increased activity at approximately the location of the bilateral first MTP joints, with right sided predominance, also likely represents degenerative changes. '      There is physiologic soft tissue radiotracer activity in the kidneys and urinary bladder.      IMPRESSION:      1.No definite scintigraphic evidence of osseous metastatic disease.   2.         There is increased radiotracer activity at the right anterior ninth and tenth ribs, which likely corresponds to healing rib fractures.   3.Moderately increased activity is noted at the L1 vertebral level from compression fracture as seen on CT.   4.Additional sites of likely degenerative changes/arthritis are noted and described above.       PSMA PET Scan 05/22/21  IMPRESSION   1. PyL binding in left internal iliac and right retrocrural lymph nodes compatible with metastatic prostate malignancy.    NM Bone Scan [facility] Body 12/22/15  IMPRESSION:  No bone scan evidence of osseous metastatic disease.       Izetta Gustin, PA-C   [facility]        Reviewed and agreed.   Barnie Chillington, PA-C  [facility]   [address]  [place]  [place], [place] [address]  [phone] Main Number  [phone] Fax     RR:[id number], [place], FNP    "
